# Patient Record
Sex: Female | Born: 1980 | Race: Asian | Hispanic: No | Marital: Single | Smoking: Former smoker
Health system: Southern US, Community
[De-identification: ages and names within clinical notes are randomized; demographics above are authoritative.]

## PROBLEM LIST (undated history)

## (undated) DIAGNOSIS — F909 Attention-deficit hyperactivity disorder, unspecified type: Secondary | ICD-10-CM

## (undated) DIAGNOSIS — T7840XA Allergy, unspecified, initial encounter: Secondary | ICD-10-CM

## (undated) DIAGNOSIS — M797 Fibromyalgia: Secondary | ICD-10-CM

## (undated) HISTORY — DX: Allergy, unspecified, initial encounter: T78.40XA

## (undated) HISTORY — DX: Attention-deficit hyperactivity disorder, unspecified type: F90.9

## (undated) HISTORY — DX: Fibromyalgia: M79.7

## (undated) HISTORY — PX: WISDOM TOOTH EXTRACTION: SHX21

---

## 2006-10-27 ENCOUNTER — Ambulatory Visit: Payer: Self-pay | Admitting: Family Medicine

## 2006-10-27 DIAGNOSIS — F329 Major depressive disorder, single episode, unspecified: Secondary | ICD-10-CM | POA: Insufficient documentation

## 2006-10-27 DIAGNOSIS — F909 Attention-deficit hyperactivity disorder, unspecified type: Secondary | ICD-10-CM | POA: Insufficient documentation

## 2006-10-27 DIAGNOSIS — N390 Urinary tract infection, site not specified: Secondary | ICD-10-CM | POA: Insufficient documentation

## 2006-10-27 DIAGNOSIS — J45909 Unspecified asthma, uncomplicated: Secondary | ICD-10-CM | POA: Insufficient documentation

## 2006-10-28 ENCOUNTER — Encounter: Payer: Self-pay | Admitting: Family Medicine

## 2006-12-14 ENCOUNTER — Ambulatory Visit: Payer: Self-pay | Admitting: Family Medicine

## 2006-12-14 LAB — CONVERTED CEMR LAB
Bilirubin Urine: NEGATIVE
Glucose, Urine, Semiquant: NEGATIVE
Ketones, urine, test strip: NEGATIVE
Nitrite: NEGATIVE
Protein, U semiquant: NEGATIVE
WBC Urine, dipstick: NEGATIVE

## 2007-01-06 ENCOUNTER — Encounter: Admission: RE | Admit: 2007-01-06 | Discharge: 2007-01-06 | Payer: Self-pay | Admitting: *Deleted

## 2007-01-26 ENCOUNTER — Telehealth: Payer: Self-pay | Admitting: Family Medicine

## 2007-04-24 ENCOUNTER — Telehealth: Payer: Self-pay | Admitting: Family Medicine

## 2007-05-04 ENCOUNTER — Telehealth: Payer: Self-pay | Admitting: Family Medicine

## 2007-05-10 ENCOUNTER — Telehealth: Payer: Self-pay | Admitting: Family Medicine

## 2007-05-15 ENCOUNTER — Encounter: Payer: Self-pay | Admitting: Family Medicine

## 2007-06-30 ENCOUNTER — Ambulatory Visit: Payer: Self-pay | Admitting: Family Medicine

## 2007-06-30 DIAGNOSIS — K219 Gastro-esophageal reflux disease without esophagitis: Secondary | ICD-10-CM | POA: Insufficient documentation

## 2007-09-06 ENCOUNTER — Telehealth: Payer: Self-pay | Admitting: Family Medicine

## 2007-11-15 ENCOUNTER — Ambulatory Visit: Payer: Self-pay | Admitting: Family Medicine

## 2007-11-20 ENCOUNTER — Telehealth: Payer: Self-pay | Admitting: Family Medicine

## 2008-01-26 ENCOUNTER — Ambulatory Visit: Payer: Self-pay | Admitting: Family Medicine

## 2008-01-26 DIAGNOSIS — R519 Headache, unspecified: Secondary | ICD-10-CM | POA: Insufficient documentation

## 2008-01-26 DIAGNOSIS — R51 Headache: Secondary | ICD-10-CM | POA: Insufficient documentation

## 2008-04-24 ENCOUNTER — Telehealth: Payer: Self-pay | Admitting: Family Medicine

## 2008-05-02 ENCOUNTER — Telehealth: Payer: Self-pay | Admitting: Family Medicine

## 2008-06-14 ENCOUNTER — Ambulatory Visit: Payer: Self-pay | Admitting: Family Medicine

## 2008-06-14 DIAGNOSIS — M791 Myalgia, unspecified site: Secondary | ICD-10-CM | POA: Insufficient documentation

## 2008-06-18 LAB — CONVERTED CEMR LAB

## 2008-06-26 ENCOUNTER — Telehealth: Payer: Self-pay | Admitting: Family Medicine

## 2008-07-08 ENCOUNTER — Encounter: Payer: Self-pay | Admitting: Family Medicine

## 2008-07-10 ENCOUNTER — Encounter: Payer: Self-pay | Admitting: Family Medicine

## 2008-07-10 ENCOUNTER — Emergency Department (HOSPITAL_COMMUNITY): Admission: EM | Admit: 2008-07-10 | Discharge: 2008-07-11 | Payer: Self-pay | Admitting: Emergency Medicine

## 2008-07-10 ENCOUNTER — Emergency Department (HOSPITAL_COMMUNITY): Admission: EM | Admit: 2008-07-10 | Discharge: 2008-07-10 | Payer: Self-pay | Admitting: Family Medicine

## 2008-07-12 ENCOUNTER — Ambulatory Visit: Payer: Self-pay | Admitting: Family Medicine

## 2008-07-12 LAB — CONVERTED CEMR LAB
Bilirubin Urine: NEGATIVE
Blood in Urine, dipstick: NEGATIVE
Glucose, Urine, Semiquant: NEGATIVE
Ketones, urine, test strip: NEGATIVE
Nitrite: NEGATIVE
Urobilinogen, UA: 0.2
pH: 6.5

## 2008-07-30 ENCOUNTER — Encounter: Payer: Self-pay | Admitting: Family Medicine

## 2008-08-13 ENCOUNTER — Telehealth: Payer: Self-pay | Admitting: Family Medicine

## 2008-10-23 ENCOUNTER — Telehealth: Payer: Self-pay | Admitting: Family Medicine

## 2008-11-18 ENCOUNTER — Telehealth (INDEPENDENT_AMBULATORY_CARE_PROVIDER_SITE_OTHER): Payer: Self-pay | Admitting: *Deleted

## 2008-12-16 ENCOUNTER — Ambulatory Visit: Payer: Self-pay | Admitting: Family Medicine

## 2009-02-27 ENCOUNTER — Ambulatory Visit: Payer: Self-pay | Admitting: Family Medicine

## 2009-03-24 ENCOUNTER — Emergency Department (HOSPITAL_COMMUNITY): Admission: EM | Admit: 2009-03-24 | Discharge: 2009-03-24 | Payer: Self-pay | Admitting: Emergency Medicine

## 2009-03-25 ENCOUNTER — Ambulatory Visit: Payer: Self-pay | Admitting: Family Medicine

## 2009-03-25 DIAGNOSIS — R29898 Other symptoms and signs involving the musculoskeletal system: Secondary | ICD-10-CM | POA: Insufficient documentation

## 2009-03-25 LAB — CONVERTED CEMR LAB
Blood in Urine, dipstick: NEGATIVE
Ketones, urine, test strip: NEGATIVE
Specific Gravity, Urine: <1.005
Urobilinogen, UA: 0.2
pH: 5.5

## 2009-03-26 LAB — CONVERTED CEMR LAB
Albumin: 4.9 g/dL (ref 3.5–5.2)
Basophils Relative: 2.1 % (ref 0.0–3.0)
CO2: 30 meq/L (ref 19–32)
Calcium: 10.4 mg/dL (ref 8.4–10.5)
Creatinine, Ser: 0.7 mg/dL (ref 0.4–1.2)
Eosinophils Absolute: 0 10*3/uL (ref 0.0–0.7)
GFR calc non Af Amer: 107.08 mL/min (ref >60)
Hemoglobin: 14.7 g/dL (ref 12.0–15.0)
Lymphs Abs: 1.6 10*3/uL (ref 0.7–4.0)
MCHC: 32.3 g/dL (ref 30.0–36.0)
Monocytes Absolute: 0.2 10*3/uL (ref 0.1–1.0)
Monocytes Relative: 3.6 % (ref 3.0–12.0)
Potassium: 4.3 meq/L (ref 3.5–5.1)
RDW: 12.6 % (ref 11.5–14.6)
Sodium: 143 meq/L (ref 135–145)
TSH: 0.58 microintl units/mL (ref 0.35–5.50)
Total Bilirubin: 0.9 mg/dL (ref 0.3–1.2)
Total Protein: 8.2 g/dL (ref 6.0–8.3)
WBC: 6.6 10*3/uL (ref 4.5–10.5)
hCG, Beta Chain, Quant, S: 0.42 milliintl units/mL

## 2009-05-12 ENCOUNTER — Emergency Department: Payer: Self-pay | Admitting: Emergency Medicine

## 2009-08-07 ENCOUNTER — Telehealth: Payer: Self-pay | Admitting: Family Medicine

## 2009-10-14 ENCOUNTER — Telehealth: Payer: Self-pay | Admitting: Family Medicine

## 2009-11-26 ENCOUNTER — Telehealth: Payer: Self-pay | Admitting: Family Medicine

## 2009-12-05 ENCOUNTER — Ambulatory Visit: Payer: Self-pay | Admitting: Family Medicine

## 2009-12-26 ENCOUNTER — Telehealth: Payer: Self-pay

## 2010-01-01 ENCOUNTER — Telehealth: Payer: Self-pay | Admitting: Family Medicine

## 2010-01-02 ENCOUNTER — Ambulatory Visit: Payer: Self-pay | Admitting: Internal Medicine

## 2010-01-02 LAB — CONVERTED CEMR LAB: Inflenza A Ag: NEGATIVE

## 2010-01-05 ENCOUNTER — Ambulatory Visit: Payer: Self-pay | Admitting: Family Medicine

## 2010-02-17 NOTE — Progress Notes (Signed)
Summary: Pt req script for Adderall XR 20mg   Phone Note Call from Patient Call back at Home Phone [phone]   Caller: Patient Summary of Call: Pt called and is req to get script for Adderall XR 20mg .   Pls notify pt when ready for pick up.  Initial call taken by: Lucy Antigua,  August 07, 2009 1:31 PM  Follow-up for Phone Call        done Follow-up by: Nelwyn Salisbury MD,  August 08, 2009 10:37 AM    New/Updated Medications: ADDERALL XR 20 MG XR24H-CAP (AMPHETAMINE-DEXTROAMPHETAMINE) two times a day as needed Prescriptions: ADDERALL XR 20 MG XR24H-CAP (AMPHETAMINE-DEXTROAMPHETAMINE) two times a day as needed  #60 x 0   Entered and Authorized by:   Nelwyn Salisbury MD   Signed by:   Nelwyn Salisbury MD on 08/08/2009   Method used:   Print then Give to Patient   RxID:   [id number]

## 2010-02-17 NOTE — Progress Notes (Signed)
Summary: new rx  Phone Note Refill Request Message from:  Patient  Refills Requested: Medication #1:  ADDERALL XR 20 MG XR24H-CAP two times a day. new rx  Initial call taken by: Heron Sabins,  November 26, 2009 1:01 PM  Follow-up for Phone Call        this is no longer available. Try Vyvanse for a month  Follow-up by: Nelwyn Salisbury MD,  November 26, 2009 2:21 PM  Additional Follow-up for Phone Call Additional follow up Details #1::        pt aware Additional Follow-up by: Pura Spice, RN,  November 26, 2009 3:49 PM    New/Updated Medications: VYVANSE 40 MG CAPS (LISDEXAMFETAMINE DIMESYLATE) once daily Prescriptions: VYVANSE 40 MG CAPS (LISDEXAMFETAMINE DIMESYLATE) once daily  #30 x 0   Entered and Authorized by:   Nelwyn Salisbury MD   Signed by:   Nelwyn Salisbury MD on 11/26/2009   Method used:   Print then Give to Patient   RxID:   [id number]

## 2010-02-17 NOTE — Assessment & Plan Note (Signed)
Summary: sinuses///ccm   Vital Signs:  Patient profile:   30 year old female Temp:     97.8 degrees F oral BP sitting:   110 / 78  (left arm) Cuff size:   regular  Vitals Entered By: Sid Falcon LPN (February 27, 2009 1:29 PM) CC: Sinusitis symptoms X 6 days   History of Present Illness: Acute visit. Onset approximately 1 week ago sinus congestive symptoms. Intermittent facial pain and headaches. Some sore throat. No fever. Had left over amoxicillin and started taking last Friday but only taking once or twice daily. Some mild improvement since starting.  Allergies (verified): No Known Drug Allergies  Past History:  Past Medical History: Last updated: 10/27/2006 Depression UTI's ADHD Allergic rhinitis Asthma PMH reviewed for relevance  Review of Systems      See HPI  Physical Exam  General:  Well-developed,well-nourished,in no acute distress; alert,appropriate and cooperative throughout examination Ears:  External ear exam shows no significant lesions or deformities.  Otoscopic examination reveals clear canals, tympanic membranes are intact bilaterally without bulging, retraction, inflammation or discharge. Hearing is grossly normal bilaterally. Nose:  erythematous nasal mucosa otherwise normal Mouth:  Oral mucosa and oropharynx without lesions or exudates.  Teeth in good repair. Neck:  No deformities, masses, or tenderness noted. Lungs:  Normal respiratory effort, chest expands symmetrically. Lungs are clear to auscultation, no crackles or wheezes. Heart:  Normal rate and regular rhythm. S1 and S2 normal without gallop, murmur, click, rub or other extra sounds.   Impression & Recommendations:  Problem # 1:  SINUSITIS, ACUTE (ICD-461.9) ?viral but will go ahead and complete course of antibiotics since she has started and has seen some mprovement. Her updated medication list for this problem includes:    Nasacort Aq 55 Mcg/act Aers (Triamcinolone acetonide(nasal))  .Marland Kitchen... 2 sprays each nostril once daily    Allegra-d 24 Hour 180-240 Mg Xr24h-tab (Fexofenadine-pseudoephedrine) .Marland Kitchen... 1 by mouth once daily    Amoxicillin 500 Mg Caps (Amoxicillin) ..... One by mouth three times a day for 10 days  Complete Medication List: 1)  Nuvaring 0.12-0.015 Mg/24hr Ring (Etonogestrel-ethinyl estradiol) .Marland Kitchen.. 1 by mouth once daily 2)  Nasacort Aq 55 Mcg/act Aers (Triamcinolone acetonide(nasal)) .... 2 sprays each nostril once daily 3)  Adderall Xr 20 Mg Cp24 (Amphetamine-dextroamphetamine) .... Two times a day 4)  Allegra-d 24 Hour 180-240 Mg Xr24h-tab (Fexofenadine-pseudoephedrine) .Marland Kitchen.. 1 by mouth once daily 5)  Flexeril 10 Mg Tabs (Cyclobenzaprine hcl) .... Three times a day as needed spasm 6)  Nabumetone 750 Mg Tabs (Nabumetone) .... Two times a day 7)  Nexium 40 Mg Cpdr (Esomeprazole magnesium) .... Once daily 8)  Pristiq 100 Mg Xr24h-tab (Desvenlafaxine succinate) .... Once daily 9)  Lorazepam 1 Mg Tabs (Lorazepam) .... Three times a day as needed anxiety 10)  Amoxicillin 500 Mg Caps (Amoxicillin) .... One by mouth three times a day for 10 days  Patient Instructions: 1)  Acute sinusitis symptoms for less than 10 days are not helped by antibiotics. Use warm moist compresses, and over the counter decongestants( only as directed). Call if no improvement in 5-7 days, sooner if increasing pain, fever, or new symptoms.  Prescriptions: AMOXICILLIN 500 MG CAPS (AMOXICILLIN) one by mouth three times a day for 10 days  #30 x 0   Entered and Authorized by:   Evelena Peat MD   Signed by:   Evelena Peat MD on 02/27/2009   Method used:   Electronically to        [id number] ([facility]       [address]       [place], [place]  [address]       Ph: [phone] or [phone]       Fax: [phone]   RxID:   [id number]

## 2010-02-17 NOTE — Assessment & Plan Note (Signed)
Summary: WEAKNESS/NJR   Vital Signs:  Patient profile:   30 year old female Height:      63 inches Weight:      124.8 pounds BMI:     22.19 Temp:     98.6 degrees F oral BP sitting:   136 / 90  (left arm) Cuff size:   regular  Vitals Entered By: Alfred Levins, CMA (March 25, 2009 2:19 PM) CC: emotional issues   History of Present Illness: Here with her father for a complicated set of issues. Apaarently she met a young man just one week ago with whom she became infatuated and to whom she became engaged. He was posing as a pediatric doctor new to the area who has supposedly been hired by [employer]. He gave her a business card listing his name and the name of a partner. Of note, this card lists an address which does not exist in [place], and it gives an email address for [facility] which does not exist. Obviously this man is a fake and a swindler, and he was trying to get her to give him some of her father's financial information like bank account numbers, etc. Her father has been talking to the [facility] [employer] about this man, and he told Judeth Cornfield all this earlier today. Of course she is distraught, and she has been extremely emotional ever since. She has been crying non-stop, and she complains of feeling very weak all over. She is demanding that I check labs on her today and is convinced something is wrong with her. She saw Dr. Billy Coast yesterday for a Pap smear and it sounds like he did some STD testing. She had also been using a [place] IUD, but she told Dr. Billy Coast to remove it and he did. She has no other specific symptoms today.   Current Medications (verified): 1)  Nuvaring 0.12-0.015 Mg/24hr  Ring (Etonogestrel-Ethinyl Estradiol) .Marland Kitchen.. 1 By Mouth Once Daily 2)  Allegra-D 24 Hour 180-240 Mg Xr24h-Tab (Fexofenadine-Pseudoephedrine) .Marland Kitchen.. 1 By Mouth Once Daily 3)  Flexeril 10 Mg  Tabs (Cyclobenzaprine Hcl) .... Three Times A Day As Needed Spasm 4)  Nabumetone 750 Mg  Tabs  (Nabumetone) .... Two Times A Day 5)  Pristiq 100 Mg Xr24h-Tab (Desvenlafaxine Succinate) .... Once Daily 6)  Lorazepam 1 Mg Tabs (Lorazepam) .... Three Times A Day As Needed Anxiety  Allergies (verified): No Known Drug Allergies  Past History:  Past Medical History: Depression, sees Dr. Milagros Evener for meds and Dr. Greig Castilla Proffer for therapy UTI's ADHD Allergic rhinitis Asthma sees Dr. Billy Coast for GYN exams  Past Surgical History: Reviewed history from 10/27/2006 and no changes required. wisdom teeth  Review of Systems  The patient denies anorexia, fever, weight loss, weight gain, vision loss, decreased hearing, hoarseness, chest pain, syncope, dyspnea on exertion, peripheral edema, prolonged cough, headaches, hemoptysis, abdominal pain, melena, hematochezia, severe indigestion/heartburn, hematuria, incontinence, genital sores, muscle weakness, suspicious skin lesions, transient blindness, difficulty walking, depression, unusual weight change, abnormal bleeding, enlarged lymph nodes, angioedema, breast masses, and testicular masses.    Physical Exam  General:  Well-developed,well-nourished,in no acute distress; alert,appropriate and cooperative throughout examination Neck:  No deformities, masses, or tenderness noted. Lungs:  Normal respiratory effort, chest expands symmetrically. Lungs are clear to auscultation, no crackles or wheezes. Heart:  Normal rate and regular rhythm. S1 and S2 normal without gallop, murmur, click, rub or other extra sounds. Abdomen:  Bowel sounds positive,abdomen soft and non-tender without masses, organomegaly or hernias noted.  Neurologic:  alert & oriented X3 and gait normal.   Psych:  Oriented X3, memory intact for recent and remote, and good eye contact.  Extremely anxious and labile, alternates between sobbing uncontrollably and lying on  the exam table motionless.    Impression & Recommendations:  Problem # 1:  WEAKNESS  (ICD-780.79)  Orders: UA Dipstick w/o Micro (automated)  (81003) Venipuncture (82956) TLB-BMP (Basic Metabolic Panel-BMET) (80048-METABOL) TLB-CBC Platelet - w/Differential (85025-CBCD) TLB-Hepatic/Liver Function Pnl (80076-HEPATIC) TLB-TSH (Thyroid Stimulating Hormone) (84443-TSH) TLB-B12, Serum-Total ONLY (21308-M57) TLB-Preg Serum Quant (B-hCG) (84702-HCG-QN)  Problem # 2:  DEPRESSION (ICD-311)  The following medications were removed from the medication list:    Pristiq 100 Mg Xr24h-tab (Desvenlafaxine succinate) ..... Once daily    Lorazepam 1 Mg Tabs (Lorazepam) .Marland Kitchen... Three times a day as needed anxiety Her updated medication list for this problem includes:    Lexapro 10 Mg Tabs (Escitalopram oxalate)  Complete Medication List: 1)  Lexapro 10 Mg Tabs (Escitalopram oxalate)  Patient Instructions: 1)  At her request we will draw labs today. Clearly she is very upset at the prospect that this man has manipulated her. I strongly urged her to contact both Dr. Inez Pilgrim and Dr. Evelene Croon about this ASAP.   Laboratory Results   Urine Tests  Date/Time Recieved: March 25, 2009 4:58 PM  Date/Time Reported: March 25, 2009 4:58 PM   Routine Urinalysis   Color: yellow Appearance: Clear Glucose: negative   (Normal Range: Negative) Bilirubin: negative   (Normal Range: Negative) Ketone: negative   (Normal Range: Negative) Spec. Gravity: <1.005   (Normal Range: 1.003-1.035) Blood: negative   (Normal Range: Negative) pH: 5.5   (Normal Range: 5.0-8.0) Protein: negative   (Normal Range: Negative) Urobilinogen: 0.2   (Normal Range: 0-1) Nitrite: negative   (Normal Range: Negative) Leukocyte Esterace: negative   (Normal Range: Negative)    Comments: Wynona Canes, CMA  March 25, 2009 4:58 PM

## 2010-02-17 NOTE — Progress Notes (Signed)
Summary: Medication question  Phone Note Call from Patient   Reason for Call: Talk to Nurse Summary of Call: Has question about her current medication - Adderol.  Please call patient as soon as you can.  [phone]. Initial call taken by: Everrett Coombe,  December 26, 2009 3:19 PM  Follow-up for Phone Call        [facility] Follow-up by: [facility] CMA AAMA,  December 26, 2009 4:54 PM  Additional Follow-up for Phone Call Additional follow up Details #1::        Pt. is asking about when Adderal when become available again.  Advised to check with the pharmacy. Additional Follow-up by: Lynann Beaver CMA AAMA,  December 26, 2009 5:05 PM

## 2010-02-17 NOTE — Assessment & Plan Note (Signed)
Summary: FLU SHOT // RS  Nurse Visit   Review of Systems       Flu Vaccine Consent Questions     Do you have a history of severe allergic reactions to this vaccine? no    Any prior history of allergic reactions to egg and/or gelatin? no    Do you have a sensitivity to the preservative Thimersol? no    Do you have a past history of Guillan-Barre Syndrome? no    Do you currently have an acute febrile illness? no    Have you ever had a severe reaction to latex? no    Vaccine information given and explained to patient? yes    Are you currently pregnant? no    Lot Number:[id number]   Exp Date:07/17/2009   Site Given  Left Deltoid IM Pura Spice, RN  December 05, 2009 3:16 PM    Allergies: No Known Drug Allergies  Orders Added: 1)  Admin 1st Vaccine [90471] 2)  Flu Vaccine 35yrs + [72536]

## 2010-02-17 NOTE — Progress Notes (Signed)
Summary: REFILL REQUEST  Phone Note Refill Request Message from:  Patient  [phone] on October 14, 2009 10:47 AM  Refills Requested: Medication #1:  ADDERALL XR 20 MG XR24H-CAP two times a day as needed.   Notes: Pt can be reached at [phone] when Rx is ready for p/u.    Initial call taken by: Debbra Riding,  October 14, 2009 10:47 AM  Follow-up for Phone Call        done Follow-up by: Nelwyn Salisbury MD,  October 14, 2009 4:33 PM    New/Updated Medications: ADDERALL XR 20 MG XR24H-CAP (AMPHETAMINE-DEXTROAMPHETAMINE) two times a day Prescriptions: ADDERALL XR 20 MG XR24H-CAP (AMPHETAMINE-DEXTROAMPHETAMINE) two times a day  #60 x 0   Entered by:   Nelwyn Salisbury MD   Authorized by:   Pura Spice, RN   Signed by:   Nelwyn Salisbury MD on 10/14/2009   Method used:   Print then Give to Patient   RxID:   [id number]  Left message pt pick up prescription.

## 2010-02-19 NOTE — Assessment & Plan Note (Signed)
Summary: med check/consult re meds   Vital Signs:  Patient profile:   30 year old female Weight:      115 pounds O2 Sat:      98 % Temp:     98.7 degrees F BP sitting:   110 / 76  (left arm)  Vitals Entered By: Pura Spice, RN (January 05, 2010 4:18 PM) CC: multiple  issues  ck left 5 th toe ? corn  talk about adderall  wants rx for latisse. and wants referral for rectal tear    History of Present Illness: here to discuss ADHD, corns, and rectal tears. She was here last week for a viral URI, and this is almost gone. She had tried Vyvanse during a recent Adderall shortage, and she has decided to go back to Adderall. She has had several tiny sore spots on her toes for anbout a year. Also she has frequentr anal tears which open during a BM and bleed slightly.   Allergies: No Known Drug Allergies  Past History:  Past Medical History: Reviewed history from 03/25/2009 and no changes required. Depression, sees Dr. Milagros Evener for meds and Dr. Greig Castilla Proffer for therapy UTI's ADHD Allergic rhinitis Asthma sees Dr. Billy Coast for GYN exams  Past Surgical History: Reviewed history from 10/27/2006 and no changes required. wisdom teeth  Review of Systems  The patient denies anorexia, fever, weight loss, weight gain, vision loss, decreased hearing, hoarseness, chest pain, syncope, dyspnea on exertion, peripheral edema, prolonged cough, headaches, hemoptysis, abdominal pain, melena, hematochezia, severe indigestion/heartburn, hematuria, incontinence, genital sores, muscle weakness, suspicious skin lesions, transient blindness, difficulty walking, depression, unusual weight change, abnormal bleeding, enlarged lymph nodes, angioedema, breast masses, and testicular masses.    Physical Exam  General:  Well-developed,well-nourished,in no acute distress; alert,appropriate and cooperative throughout examination Extremities:  the 5th toes of each foot have small lumps over the DIPs  Psych:   Cognition and judgment appear intact. Alert and cooperative with normal attention span and concentration. 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 Two times a day, may fill on 03-05-10 2)  Anusol-hc 25 Mg Supp (Hydrocortisone acetate) .... Use q 4 hours as needed 3)  Latisse 0.03 % Soln (Bimatoprost) .... Apply once daily  Patient Instructions: 1)  try to get more dietary fiber. Try Anusol HC suppositories. Rub the corns with a pumice stone once daily in the shower. Prescriptions: LATISSE 0.03 % SOLN (BIMATOPROST) apply once daily  #60 x 11   Entered and Authorized by:   Nelwyn Salisbury MD   Signed by:   Nelwyn Salisbury MD on 01/05/2010   Method used:   [facility] to        [facility]  [phone][facility] (retail)       [address] [place], [place]  [address]       Ph: [phone] or [phone]       Fax: [phone]   RxID:   [id number] ANUSOL-HC 25 MG SUPP (HYDROCORTISONE ACETATE) use q 4 hours as needed  #60 x 5   Entered and Authorized by:   Nelwyn Salisbury MD   Signed by:   Nelwyn Salisbury MD on 01/05/2010   Method used:   [facility] to  [facility]  [phone]* (retail)       [address] [place], [place]  [address]       Ph: [phone] or [phone]       Fax: [phone]   RxID:   [id number]    Orders Added: 1)  Est. Patient Level IV [44034]

## 2010-02-19 NOTE — Progress Notes (Signed)
Summary: new rx  Phone Note Refill Request Call back at Home Phone [phone] Message from:  Patient  pt needs new rx adderall xr 20 mg twice a day  Initial call taken by: Heron Sabins,  January 01, 2010 2:11 PM  Follow-up for Phone Call        done Follow-up by: Nelwyn Salisbury MD,  January 02, 2010 2:12 PM  Additional Follow-up for Phone Call Additional follow up Details #1::        pt cam in to be seen and p/u rx's Additional Follow-up by: Alfred Levins, CMA,  January 02, 2010 3:35 PM    New/Updated Medications: ADDERALL XR 20 MG XR24H-CAP (AMPHETAMINE-DEXTROAMPHETAMINE) two times a day ADDERALL XR 20 MG XR24H-CAP (AMPHETAMINE-DEXTROAMPHETAMINE) two times a day, may fill on 02-02-10 ADDERALL XR 20 MG XR24H-CAP (AMPHETAMINE-DEXTROAMPHETAMINE) two times a day, may fill on 03-05-10 Prescriptions: ADDERALL XR 20 MG XR24H-CAP (AMPHETAMINE-DEXTROAMPHETAMINE) two times a day, may fill on 03-05-10  #60 x 0   Entered and Authorized by:   Nelwyn Salisbury MD   Signed by:   Nelwyn Salisbury MD on 01/02/2010   Method used:   Print then Give to Patient   RxID:   [id number] ADDERALL XR 20 MG XR24H-CAP (AMPHETAMINE-DEXTROAMPHETAMINE) two times a day, may fill on 02-02-10  #60 x 0   Entered and Authorized by:   Nelwyn Salisbury MD   Signed by:   Nelwyn Salisbury MD on 01/02/2010   Method used:   Print then Give to Patient   RxID:   [id number] ADDERALL XR 20 MG XR24H-CAP (AMPHETAMINE-DEXTROAMPHETAMINE) two times a day  #60 x 0   Entered and Authorized by:   Nelwyn Salisbury MD   Signed by:   Nelwyn Salisbury MD on 01/02/2010   Method used:   Print then Give to Patient   RxID:   [id number]

## 2010-02-19 NOTE — Assessment & Plan Note (Signed)
Summary: FLU LIKE SYMPTOMS//ALP   Vital Signs:  Patient profile:   30 year old female Weight:      121 pounds BMI:     21.51 Temp:     98.8 degrees F oral BP sitting:   114 / 74  (left arm) Cuff size:   regular  Vitals Entered By: Alfred Levins, CMA (January 02, 2010 3:04 PM) CC: weak, achy, sweats x3 days   CC:  weak, achy, and sweats x3 days.  History of Present Illness: Patient presents to clinic as a workin for evaluation of possible URI symptoms. Notes 3d h/o generalized malaise, myalgias, nasal congestion and clear rhinorrhea. Has had several episodes of self-limited epistaxis and subjective fever. Notes +sick exposure. No allieviating or exacerbating factors. Obtained influenza vaccine 11/11.    Allergies: No Known Drug Allergies  Review of Systems General:  Complains of fatigue, fever, and malaise; denies chills. ENT:  Complains of nasal congestion, nosebleeds, and postnasal drainage; denies ear discharge and earache. GI:  Complains of nausea; denies vomiting.  Physical Exam  General:  Well-developed,well-nourished,in no acute distress; alert,appropriate and cooperative throughout examination Head:  Normocephalic and atraumatic without obvious abnormalities. No apparent alopecia or balding. Eyes:  pupils equal, pupils round, corneas and lenses clear, and no injection.   Ears:  External ear exam shows no significant lesions or deformities.  Otoscopic examination reveals clear canals, tympanic membranes are intact bilaterally without bulging, retraction, inflammation or discharge. Hearing is grossly normal bilaterally. Nose:  External nasal examination shows no deformity or inflammation. Nasal mucosa are pink and moist without lesions or exudates. Mouth:  pharynx pink and moist, no erythema, and no exudates.   Neck:  No deformities, masses, or tenderness noted. Lungs:  Normal respiratory effort, chest expands symmetrically. Lungs are clear to auscultation, no crackles or  wheezes. Heart:  Normal rate and regular rhythm. S1 and S2 normal without gallop, murmur, click, rub or other extra sounds. Skin:  turgor normal, color normal, and no rashes.     Impression & Recommendations:  Problem # 1:  URI (ICD-465.9) Flu swab obtained and negative.  Likely viral etiology. Recommend otc symptomatic relief with antihistamine/decongestants.  For mild intermittent epistaxis use otc nasal saline spray. Followup if no improvement or worsening.  Complete Medication List: 1)  Lexapro 10 Mg Tabs (Escitalopram oxalate) 2)  Vyvanse 40 Mg Caps (Lisdexamfetamine dimesylate) .... Once daily 3)  Adderall Xr 20 Mg Xr24h-cap (Amphetamine-dextroamphetamine) .... Two times a day, may fill on 03-05-10  Other Orders: Flu A+B [id number]   Orders Added: 1)  Flu A+B [87400] 2)  Est. Patient Level III [84696]    Laboratory Results    Other Tests  Influenza A: negative Influenza B: negative Comments: Rita Ohara  January 02, 2010 4:00 PM   Kit Test Internal QC: Negative   (Normal Range: Negative)

## 2010-02-23 ENCOUNTER — Telehealth: Payer: Self-pay | Admitting: Family Medicine

## 2010-02-23 DIAGNOSIS — F909 Attention-deficit hyperactivity disorder, unspecified type: Secondary | ICD-10-CM

## 2010-02-23 NOTE — Telephone Encounter (Addendum)
Pt lost adderall 20 mg #60 prescription. This is message already sent

## 2010-02-27 MED ORDER — AMPHETAMINE-DEXTROAMPHET ER 20 MG PO CP24: 20.0000 mg | ORAL_CAPSULE | Freq: Two times a day (BID) | ORAL | Status: DC

## 2010-02-27 NOTE — Telephone Encounter (Signed)
Done, but I wrote for one month only. She needs to take better care of her prescriptions.

## 2010-02-27 NOTE — Telephone Encounter (Signed)
Left mess with pt rx aware for pick up

## 2010-03-19 ENCOUNTER — Telehealth: Payer: Self-pay | Admitting: Family Medicine

## 2010-03-19 DIAGNOSIS — F909 Attention-deficit hyperactivity disorder, unspecified type: Secondary | ICD-10-CM

## 2010-03-19 NOTE — Telephone Encounter (Signed)
Pt called to adv that she needs a refill on med: Adderall xr 20mg ..... Pt can be reached at [phone].

## 2010-03-20 MED ORDER — AMPHETAMINE-DEXTROAMPHET ER 20 MG PO CP24: 20.0000 mg | ORAL_CAPSULE | Freq: Two times a day (BID) | ORAL | Status: DC

## 2010-03-20 NOTE — Telephone Encounter (Signed)
done

## 2010-03-20 NOTE — Telephone Encounter (Signed)
rx up front ready for p/u, pt aware 

## 2010-04-01 ENCOUNTER — Telehealth: Payer: Self-pay | Admitting: Family Medicine

## 2010-04-01 NOTE — Telephone Encounter (Signed)
Pt came by and would like to req Adderall XR 20 mg # 60 for 3 month.  Pls call pt when ready for pick up. No rush.

## 2010-04-07 NOTE — Telephone Encounter (Signed)
Pt already came by and got rx for adderall

## 2010-04-27 LAB — COMPREHENSIVE METABOLIC PANEL
ALT: 12 U/L (ref 0–35)
Albumin: 4 g/dL (ref 3.5–5.2)
Calcium: 9.3 mg/dL (ref 8.4–10.5)
Creatinine, Ser: 0.85 mg/dL (ref 0.4–1.2)
GFR calc non Af Amer: >60 mL/min (ref >60)
Potassium: 3.2 mEq/L — ABNORMAL LOW (ref 3.5–5.1)
Total Bilirubin: 0.8 mg/dL (ref 0.3–1.2)

## 2010-04-27 LAB — DIFFERENTIAL
Eosinophils Absolute: 0.3 10*3/uL (ref 0.0–0.7)
Lymphs Abs: 0.6 10*3/uL — ABNORMAL LOW (ref 0.7–4.0)
Monocytes Absolute: 0.3 10*3/uL (ref 0.1–1.0)
Neutro Abs: 6.8 10*3/uL (ref 1.7–7.7)
Neutrophils Relative %: 85 % — ABNORMAL HIGH (ref 43–77)

## 2010-04-27 LAB — LIPASE, BLOOD: Lipase: 14 U/L (ref 11–59)

## 2010-04-27 LAB — CBC
HCT: 43.7 % (ref 36.0–46.0)
MCV: 94.5 fL (ref 78.0–100.0)
RDW: 12.8 % (ref 11.5–15.5)

## 2010-04-27 LAB — URINALYSIS, ROUTINE W REFLEX MICROSCOPIC
Bilirubin Urine: NEGATIVE
Glucose, UA: NEGATIVE mg/dL
Ketones, ur: >80 mg/dL — AB
Protein, ur: NEGATIVE mg/dL
Specific Gravity, Urine: 1.006 (ref 1.005–1.030)
pH: 7 (ref 5.0–8.0)

## 2010-04-27 LAB — URINE MICROSCOPIC-ADD ON

## 2010-04-29 ENCOUNTER — Other Ambulatory Visit: Payer: Self-pay

## 2010-04-29 NOTE — Telephone Encounter (Signed)
Pt would like to pick up adderall 20mg  XR (generic) on Monday Call back [phone]

## 2010-05-04 MED ORDER — AMPHETAMINE-DEXTROAMPHET ER 20 MG PO CP24: 20.0000 mg | ORAL_CAPSULE | Freq: Two times a day (BID) | ORAL | Status: DC

## 2010-05-04 NOTE — Telephone Encounter (Signed)
Pt aware rx ready.,

## 2010-05-04 NOTE — Telephone Encounter (Signed)
done

## 2010-05-26 ENCOUNTER — Ambulatory Visit (INDEPENDENT_AMBULATORY_CARE_PROVIDER_SITE_OTHER): Payer: BC Managed Care – PPO | Admitting: Family Medicine

## 2010-05-26 ENCOUNTER — Encounter: Payer: Self-pay | Admitting: Family Medicine

## 2010-05-26 VITALS — BP 98/66 | Temp 98.6°F | Wt 115.0 lb

## 2010-05-26 DIAGNOSIS — R300 Dysuria: Secondary | ICD-10-CM

## 2010-05-26 DIAGNOSIS — M791 Myalgia, unspecified site: Secondary | ICD-10-CM

## 2010-05-26 DIAGNOSIS — K219 Gastro-esophageal reflux disease without esophagitis: Secondary | ICD-10-CM

## 2010-05-26 DIAGNOSIS — IMO0001 Reserved for inherently not codable concepts without codable children: Secondary | ICD-10-CM

## 2010-05-26 DIAGNOSIS — J329 Chronic sinusitis, unspecified: Secondary | ICD-10-CM

## 2010-05-26 LAB — POCT URINALYSIS DIPSTICK
Blood, UA: NEGATIVE
Glucose, UA: NEGATIVE
Ketones, UA: NEGATIVE
Protein, UA: NEGATIVE
Spec Grav, UA: 1.005
Urobilinogen, UA: 0.2

## 2010-05-26 MED ORDER — ESOMEPRAZOLE MAGNESIUM 40 MG PO CPDR: 40.0000 mg | DELAYED_RELEASE_CAPSULE | Freq: Every day | ORAL | Status: DC

## 2010-05-26 MED ORDER — AZITHROMYCIN 250 MG PO TABS: ORAL_TABLET | ORAL | Status: AC

## 2010-05-26 NOTE — Progress Notes (Signed)
  Subjective:    Patient ID: Joyce Cortez, female    DOB: Aug 24, 1980, 30 y.o.   MRN: [id number]  HPI Here for one week of sinus pressure, PND, ST, and a dry cough. No fever.   Review of Systems  Constitutional: Negative.   HENT: Positive for congestion, postnasal drip and sinus pressure.   Eyes: Negative.   Respiratory: Positive for cough.        Objective:   Physical Exam  Constitutional: She appears well-developed and well-nourished.  HENT:  Right Ear: External ear normal.  Left Ear: External ear normal.  Nose: Nose normal.  Mouth/Throat: Oropharynx is clear and moist. No oropharyngeal exudate.  Eyes: Conjunctivae are normal. Pupils are equal, round, and reactive to light.  Pulmonary/Chest: Effort normal and breath sounds normal.  Lymphadenopathy:    She has no cervical adenopathy.          Assessment & Plan:  Rest. Fluids.

## 2010-05-27 NOTE — Progress Notes (Signed)
Pt informed

## 2010-05-27 NOTE — Progress Notes (Signed)
Addended by: Dwaine Deter on: 05/27/2010 08:16 AM   Modules accepted: Orders

## 2010-08-06 ENCOUNTER — Telehealth: Payer: Self-pay | Admitting: Family Medicine

## 2010-08-06 NOTE — Telephone Encounter (Signed)
Pt. Requesting refill on amphetamine-dextroamphetamine (ADDERALL XR, 20MG ,) 20 MG 24 hr capsule

## 2010-08-07 NOTE — Telephone Encounter (Signed)
Refill request for Adderall and pt last here on 05/26/10 and script last filled on 01/02/10.

## 2010-08-10 MED ORDER — AMPHETAMINE-DEXTROAMPHET ER 20 MG PO CP24: 20.0000 mg | ORAL_CAPSULE | Freq: Two times a day (BID) | ORAL | Status: DC

## 2010-08-10 NOTE — Telephone Encounter (Signed)
done

## 2010-10-05 ENCOUNTER — Telehealth: Payer: Self-pay | Admitting: Family Medicine

## 2010-10-05 NOTE — Telephone Encounter (Signed)
Refill Adderall xr 20mg  bid. Please leave message on phone when ready.

## 2010-10-07 NOTE — Telephone Encounter (Signed)
Pt last seen 05/26/10. Pls advise.

## 2010-10-08 MED ORDER — AMPHETAMINE-DEXTROAMPHET ER 20 MG PO CP24: 20.0000 mg | ORAL_CAPSULE | Freq: Two times a day (BID) | ORAL | Status: DC

## 2010-10-08 NOTE — Telephone Encounter (Signed)
done

## 2010-10-08 NOTE — Telephone Encounter (Signed)
Script is ready for pick up and I tried to call pt but no option to leave a message.

## 2010-10-29 ENCOUNTER — Telehealth: Payer: Self-pay | Admitting: Family Medicine

## 2010-10-29 NOTE — Telephone Encounter (Signed)
Refill Adderall x 3 rxs. Thanks. °

## 2010-10-30 NOTE — Telephone Encounter (Signed)
Left voice message.

## 2010-10-30 NOTE — Telephone Encounter (Signed)
NO, she has refills until 01-07-11 already

## 2011-01-06 ENCOUNTER — Telehealth: Payer: Self-pay | Admitting: Family Medicine

## 2011-01-06 MED ORDER — AMPHETAMINE-DEXTROAMPHET ER 20 MG PO CP24: 20.0000 mg | ORAL_CAPSULE | Freq: Two times a day (BID) | ORAL | Status: DC

## 2011-01-06 NOTE — Telephone Encounter (Signed)
done

## 2011-01-06 NOTE — Telephone Encounter (Signed)
Pt rx is for amphetamine-dextroamphetamine (ADDERALL XR, 20MG ,) 20 MG 24 hr capsule But pt is requesting a refill with script written for Adderall not generic

## 2011-03-31 ENCOUNTER — Other Ambulatory Visit: Payer: Self-pay | Admitting: Family Medicine

## 2011-03-31 NOTE — Telephone Encounter (Signed)
Pt needs new rx adderall 20 mg twice a day #60. Pt would like brand name

## 2011-04-02 MED ORDER — AMPHETAMINE-DEXTROAMPHET ER 20 MG PO CP24: 20.0000 mg | ORAL_CAPSULE | Freq: Two times a day (BID) | ORAL | Status: DC

## 2011-04-02 NOTE — Telephone Encounter (Signed)
done

## 2011-04-02 NOTE — Telephone Encounter (Signed)
Script ready for pick up and spoke with pt. 

## 2011-07-23 ENCOUNTER — Other Ambulatory Visit: Payer: Self-pay | Admitting: Family Medicine

## 2011-07-23 NOTE — Telephone Encounter (Signed)
Pt needs new rx name brand adderall 20 xr #60. Pt is request 3 month rxs

## 2011-07-28 NOTE — Telephone Encounter (Signed)
She needs an OV for this (last seen May 2012)

## 2011-07-28 NOTE — Telephone Encounter (Signed)
Pt will callback to sch ov °

## 2011-08-11 ENCOUNTER — Ambulatory Visit (INDEPENDENT_AMBULATORY_CARE_PROVIDER_SITE_OTHER): Payer: BC Managed Care – PPO | Admitting: Family Medicine

## 2011-08-11 ENCOUNTER — Encounter: Payer: Self-pay | Admitting: Family Medicine

## 2011-08-11 VITALS — BP 108/66 | HR 118 | Temp 98.9°F | Wt 127.0 lb

## 2011-08-11 DIAGNOSIS — F909 Attention-deficit hyperactivity disorder, unspecified type: Secondary | ICD-10-CM

## 2011-08-11 DIAGNOSIS — K219 Gastro-esophageal reflux disease without esophagitis: Secondary | ICD-10-CM

## 2011-08-11 MED ORDER — AMPHETAMINE-DEXTROAMPHET ER 20 MG PO CP24: 20.0000 mg | ORAL_CAPSULE | Freq: Two times a day (BID) | ORAL | Status: DC

## 2011-08-11 NOTE — Progress Notes (Signed)
  Subjective:    Patient ID: Joyce Cortez, female    DOB: 1980/02/12, 31 y.o.   MRN: [id number]  HPI Here to follow up on ADHD and GERD. Over the past year her stress levels have gone down, and she has very little trouble with GERD anymore. She does not take Nexium andis doing well. She remains pleased with Adderall and would like to stay on it. No side effects.    Review of Systems  Constitutional: Negative.   Respiratory: Negative.   Cardiovascular: Negative.   Gastrointestinal: Negative.   Neurological: Negative.   Psychiatric/Behavioral: Negative.        Objective:   Physical Exam  Constitutional: She is oriented to person, place, and time. She appears well-developed and well-nourished.  Cardiovascular: Normal rate, regular rhythm, normal heart sounds and intact distal pulses.   Pulmonary/Chest: Effort normal and breath sounds normal.  Neurological: She is alert and oriented to person, place, and time.          Assessment & Plan:  Refilled Adderall XR for 3  Months

## 2011-09-07 ENCOUNTER — Telehealth: Payer: Self-pay | Admitting: Family Medicine

## 2011-09-07 DIAGNOSIS — K6289 Other specified diseases of anus and rectum: Secondary | ICD-10-CM

## 2011-09-07 NOTE — Telephone Encounter (Signed)
Patient called stating that she would like to be referred to a Gastro. MD preferably one that can see her next week. Please advise.

## 2011-09-08 NOTE — Telephone Encounter (Signed)
done

## 2011-09-08 NOTE — Telephone Encounter (Signed)
Can you call pt and ask why she needs the referral? See below note from the doctor.

## 2011-09-08 NOTE — Telephone Encounter (Signed)
Patient states she has been having problems with fishers and she would like to have an evaluation to make sure that there is nothing more. Patient states her Dermatologist suggested that she see a Gastro. MD for them.

## 2011-09-08 NOTE — Telephone Encounter (Signed)
I need to know why she wants to be referred. I just saw her a few weeks ago and she told she she was doing quite well with her GERD

## 2011-09-08 NOTE — Telephone Encounter (Signed)
I left voice message with below information. 

## 2011-10-13 ENCOUNTER — Encounter: Payer: Self-pay | Admitting: Family Medicine

## 2011-10-13 ENCOUNTER — Ambulatory Visit (INDEPENDENT_AMBULATORY_CARE_PROVIDER_SITE_OTHER): Payer: BC Managed Care – PPO | Admitting: Family Medicine

## 2011-10-13 ENCOUNTER — Telehealth: Payer: Self-pay | Admitting: Gastroenterology

## 2011-10-13 VITALS — BP 102/72 | Temp 99.1°F | Wt 126.0 lb

## 2011-10-13 DIAGNOSIS — J019 Acute sinusitis, unspecified: Secondary | ICD-10-CM

## 2011-10-13 MED ORDER — AZITHROMYCIN 250 MG PO TABS: ORAL_TABLET | ORAL | Status: DC

## 2011-10-13 NOTE — Telephone Encounter (Signed)
Pt scheduled to see Willette Cluster NP 10/15/11 @1 :30pm. Pt being seen for rectal pain. Pt aware of appt date and time.

## 2011-10-13 NOTE — Telephone Encounter (Signed)
Pt has been having the rectal pain for a while now off and on. Pt thinks she may have fissures.

## 2011-10-13 NOTE — Patient Instructions (Addendum)

## 2011-10-13 NOTE — Progress Notes (Signed)
  Subjective:    Patient ID: Joyce Cortez, female    DOB: 03-24-1980, 31 y.o.   MRN: [id number]  HPI  2 week history progressive sinus symptoms. She's had progressive bilateral maxillary facial pain and cough. Slight dyspnea with activity. No wheezing. Initially started with allergy or cold like symptoms. Denies any headaches. Generalized malaise. Cough mostly nonproductive. Possible low-grade fever past couple days. Temperature not taken. Chills off and on. Has had some intermittent colored nasal discharge   Review of Systems As per history of present illness    Objective:   Physical Exam  Constitutional: She appears well-developed and well-nourished.  HENT:  Right Ear: External ear normal.  Left Ear: External ear normal.  Mouth/Throat: Oropharynx is clear and moist.  Neck: Neck supple.  Cardiovascular: Normal rate and regular rhythm.   Pulmonary/Chest: Effort normal and breath sounds normal. No respiratory distress. She has no wheezes. She has no rales.  Lymphadenopathy:    She has no cervical adenopathy.  Skin: No rash noted.          Assessment & Plan:  URI. Symptoms over 2 weeks with progressive facial pain and possible low-grade fevers. Consider acute sinusitis following viral URI. Zithromax for 5 days. Continue good hydration. Touch base next week if not improving

## 2011-10-15 ENCOUNTER — Encounter: Payer: Self-pay | Admitting: Nurse Practitioner

## 2011-10-15 ENCOUNTER — Ambulatory Visit (INDEPENDENT_AMBULATORY_CARE_PROVIDER_SITE_OTHER): Payer: BC Managed Care – PPO | Admitting: Nurse Practitioner

## 2011-10-15 VITALS — BP 118/68 | HR 62 | Ht 64.0 in | Wt 126.0 lb

## 2011-10-15 DIAGNOSIS — K602 Anal fissure, unspecified: Secondary | ICD-10-CM

## 2011-10-15 DIAGNOSIS — K5900 Constipation, unspecified: Secondary | ICD-10-CM

## 2011-10-15 MED ORDER — DILTIAZEM GEL 2 %: CUTANEOUS | Status: DC

## 2011-10-15 MED ORDER — LIDOCAINE HCL 2 % EX GEL: Freq: Three times a day (TID) | CUTANEOUS | Status: DC

## 2011-10-15 NOTE — Patient Instructions (Addendum)
1. Take 2 stool softeners daily 2. Begin daily fiber, samples given to you. Metamucil samples and coupon. 3. If stools are still hard or you continue to have difficulty passing stools then began MiraLax one capful daily in 8 ounces of water in addition to the stool softeners and daily fiber. We have given you samples of Miralax.  4. Sitz baths three times a day for two weeks. We have called in prescriptions to [facility] for you : Diltiazem gel and Zylocaine Jelly. Use as directed. We have given them your BC[employer] RX numbers .   [facility] is in the [employer] directly across the parking lot from [employer].   Their phone number is [phone].

## 2011-10-15 NOTE — Progress Notes (Signed)
Agree with Ms. Guenther's assessment and plan. Dewaun Kinzler E. Laddie Naeem, MD, FACG   

## 2011-10-15 NOTE — Progress Notes (Signed)
10/15/2011 Joyce Cortez [id number] Apr 22, 1980   HISTORY OF PRESENT ILLNESS: Patient is a 31 year old female here for evaluation of rectal pain and bleeding. Last month patient had several episodes of rectal bleeding with bowel movements. She feels a tear around anus. She has itching and discomfort in the area. No unusual abdominal pain. Sometimes stools difficult to pass. No other gastrointestinal complaints   Past Medical History  Diagnosis Date  . ADHD (attention deficit hyperactivity disorder)   . Allergy   . Asthma    Past Surgical History  Procedure Date  . Wisdom tooth extraction     reports that she has quit smoking. She has never used smokeless tobacco. She reports that she drinks alcohol. She reports that she does not use illicit drugs. family history is not on file.  She is adopted. No Known Allergies    Outpatient Encounter Prescriptions as of 10/15/2011  Medication Sig Dispense Refill  . amphetamine-dextroamphetamine (ADDERALL) 20 MG tablet Take 20 mg by mouth 2 (two) times daily.      . IUD's (PARAGARD INTRAUTERINE COPPER) IUD IUD 1 each by Intrauterine route once.      Marland Kitchen DISCONTD: amphetamine-dextroamphetamine (ADDERALL XR, 20MG ,) 20 MG 24 hr capsule Take 1 capsule (20 mg total) by mouth 2 (two) times daily.  60 capsule  0  . DISCONTD: azithromycin (ZITHROMAX Z-PAK) 250 MG tablet Take 2 tablets (500 mg) on  Day 1,  followed by 1 tablet (250 mg) once daily on Days 2 through 5.  6 each  0  . DISCONTD: amphetamine-dextroamphetamine (ADDERALL XR) 20 MG 24 hr capsule Take 1 capsule (20 mg total) by mouth 2 (two) times daily.  60 capsule  0  . DISCONTD: amphetamine-dextroamphetamine (ADDERALL XR) 20 MG 24 hr capsule Take 1 capsule (20 mg total) by mouth 2 (two) times daily.  60 capsule  0  . DISCONTD: amphetamine-dextroamphetamine (ADDERALL XR, 20MG ,) 20 MG 24 hr capsule Take 1 capsule (20 mg total) by mouth 2 (two) times daily.  60 capsule  0  . DISCONTD:  amphetamine-dextroamphetamine (ADDERALL XR, 20MG ,) 20 MG 24 hr capsule Take 1 capsule (20 mg total) by mouth 2 (two) times daily.  60 capsule  0  . DISCONTD: amphetamine-dextroamphetamine (ADDERALL XR, 20MG ,) 20 MG 24 hr capsule Take 1 capsule (20 mg total) by mouth 2 (two) times daily.  60 capsule  0  . DISCONTD: amphetamine-dextroamphetamine (ADDERALL XR, 20MG ,) 20 MG 24 hr capsule Take 1 capsule (20 mg total) by mouth 2 (two) times daily.  60 capsule  0  . DISCONTD: esomeprazole (NEXIUM) 40 MG capsule Take 1 capsule (40 mg total) by mouth daily.  30 capsule  1  . DISCONTD: hydrocortisone (ANUSOL-HC) 25 MG suppository Place 25 mg rectally every 4 (four) hours.           REVIEW OF SYSTEMS  : All other systems reviewed and negative except where noted in the History of Present Illness.   PHYSICAL EXAM: BP 118/68  Pulse 62  Ht 5\' 4"  (1.626 m)  Wt 126 lb (57.153 kg)  BMI 21.63 kg/m2  LMP 09/16/2011 General: Well developed asian female in no acute distress Head: Normocephalic and atraumatic Eyes:  sclerae anicteric,conjunctive pink. Ears: Normal auditory acuity Neck: Supple, no masses.  Lungs: Clear throughout to auscultation Heart: Regular rate and rhythm Abdomen: Soft, nontender, non distended. No masses or hepatomegaly noted. Normal bowel sounds Rectal: Midline posterior fissure. Fissure extends a few millimeters into the anal canal as seen on anoscopy area with  scant bloody drainage, not significantly tender in the area Musculoskeletal: Symmetrical with no gross deformities  Skin: No lesions on visible extremities Extremities: No edema  Neurological: Alert oriented x 4, grossly nonfocal Cervical Nodes:  No significant cervical adenopathy Psychological:  Alert and cooperative. Normal mood and affect  ASSESSMENT AND PLAN:  1. Anal fissure.  Will treat with diltiazem gel 3 times daily, sitz baths and trial of lidocaine gel for pain relief. Need to avoid constipation. Followup here in  one month  2. intermittent constipation with hard stools. Will add daily fiber, two stool softeners daily. If stools still hard patient add daily MiraLax.

## 2011-10-26 ENCOUNTER — Ambulatory Visit (INDEPENDENT_AMBULATORY_CARE_PROVIDER_SITE_OTHER): Payer: BC Managed Care – PPO | Admitting: Family Medicine

## 2011-10-26 ENCOUNTER — Telehealth: Payer: Self-pay | Admitting: Family Medicine

## 2011-10-26 DIAGNOSIS — Z23 Encounter for immunization: Secondary | ICD-10-CM

## 2011-10-26 MED ORDER — AMPHETAMINE-DEXTROAMPHETAMINE 20 MG PO TABS: 20.0000 mg | ORAL_TABLET | Freq: Two times a day (BID) | ORAL | Status: DC

## 2011-10-26 NOTE — Telephone Encounter (Signed)
Pt is req 3 month script for amphetamine-dextroamphetamine (ADDERALL) 20 MG tablet. Pt req to pick up asap this wk. Pt said that her father or mother, Rosalia Hammers or Tyler Aas, when ready.

## 2011-10-26 NOTE — Telephone Encounter (Signed)
Pt is here to pick up.  

## 2011-10-26 NOTE — Telephone Encounter (Signed)
done

## 2011-11-03 ENCOUNTER — Encounter: Payer: Self-pay | Admitting: Family Medicine

## 2011-11-03 ENCOUNTER — Ambulatory Visit (INDEPENDENT_AMBULATORY_CARE_PROVIDER_SITE_OTHER): Payer: BC Managed Care – PPO | Admitting: Family Medicine

## 2011-11-03 VITALS — BP 108/66 | HR 82 | Temp 98.6°F | Wt 130.0 lb

## 2011-11-03 DIAGNOSIS — G589 Mononeuropathy, unspecified: Secondary | ICD-10-CM

## 2011-11-03 DIAGNOSIS — G629 Polyneuropathy, unspecified: Secondary | ICD-10-CM

## 2011-11-03 LAB — HEPATIC FUNCTION PANEL
ALT: 12 U/L (ref 0–35)
Total Bilirubin: 0.4 mg/dL (ref 0.3–1.2)

## 2011-11-03 LAB — CBC WITH DIFFERENTIAL/PLATELET
Eosinophils Relative: 0.2 % (ref 0.0–5.0)
HCT: 41 % (ref 36.0–46.0)
Hemoglobin: 13.3 g/dL (ref 12.0–15.0)
Lymphocytes Relative: 33 % (ref 12.0–46.0)
Lymphs Abs: 2.4 10*3/uL (ref 0.7–4.0)
Monocytes Relative: 5.1 % (ref 3.0–12.0)
Platelets: 207 10*3/uL (ref 150.0–400.0)
WBC: 7.1 10*3/uL (ref 4.5–10.5)

## 2011-11-03 LAB — BASIC METABOLIC PANEL
BUN: 12 mg/dL (ref 6–23)
Creatinine, Ser: 0.7 mg/dL (ref 0.4–1.2)
GFR: 103.35 mL/min (ref >60.00)

## 2011-11-03 LAB — TSH: TSH: 0.76 u[IU]/mL (ref 0.35–5.50)

## 2011-11-03 LAB — VITAMIN B12: Vitamin B-12: >1500 pg/mL — ABNORMAL HIGH (ref 211–911)

## 2011-11-03 NOTE — Progress Notes (Signed)
  Subjective:    Patient ID: Joyce Cortez, female    DOB: 06-28-1980, 31 y.o.   MRN: [id number]  HPI Here for burning and tingling in her feet, especially the toes. This started several weeks ago after she was on her feet all day while wearing a tight pair of boots. She has been wearing a pair of sandals the past 3 days and her feet feel a little better. No redness or swelling.    Review of Systems  Constitutional: Negative.        Objective:   Physical Exam  Constitutional: She is oriented to person, place, and time. She appears well-developed and well-nourished.  Cardiovascular: Intact distal pulses.   Musculoskeletal:       Both feet appear normal, good blood flow. No erythema or warmth or swelling. Skin is intact   Neurological: She is alert and oriented to person, place, and time. She has normal reflexes. No cranial nerve deficit. She exhibits normal muscle tone. Coordination normal.          Assessment & Plan:  This is probably the result of a compressive injury from wearing the tight boots, but we will screen for possible sources of neuropathy. She will wear loose fitting shoes.

## 2011-11-04 ENCOUNTER — Ambulatory Visit: Payer: BC Managed Care – PPO | Admitting: Internal Medicine

## 2011-11-04 ENCOUNTER — Telehealth: Payer: Self-pay | Admitting: Family Medicine

## 2011-11-04 NOTE — Telephone Encounter (Signed)
Patient called stating that she would like to be referred to a Podiatrist for the feeling loss in her left foot and toes. Please advise/assist.

## 2011-11-04 NOTE — Telephone Encounter (Signed)
Disreguard

## 2011-11-05 NOTE — Progress Notes (Signed)
Quick Note:  I left a voice message with results. ______ 

## 2011-11-16 ENCOUNTER — Ambulatory Visit: Payer: BC Managed Care – PPO | Admitting: Internal Medicine

## 2011-11-24 ENCOUNTER — Telehealth: Payer: Self-pay | Admitting: Family Medicine

## 2011-11-24 MED ORDER — AMPHETAMINE-DEXTROAMPHET ER 20 MG PO CP24: 20.0000 mg | ORAL_CAPSULE | Freq: Two times a day (BID) | ORAL | Status: DC

## 2011-11-24 NOTE — Telephone Encounter (Signed)
Caller: [name]/Patient; Patient Name: Joyce Cortez; PCP: [facility]); Best Callback Phone Number: [phone]; Reason for call: Other Is wanting Nexium sent to pharmacy in [facility] 11-6 as has been taking Adderall tablets instead of capsules. Has caused "upset stomache". No vomiting. Feels nauseated. Thinks medicine would help with abdominal discomfort. Refuses to try Pepcid or Zantac.. Is leaving to go to [facility] in 1 hour. Advised appointment 11-7 but states will be in [place]. Is upset and states "nothing has changed except stomache pain" and began after starting tablets.  PLEASE CALL PATIENT AND ADVISE IF CAN CALL IN NEXIUM WITHOUT APPOINTMENT

## 2011-11-24 NOTE — Telephone Encounter (Signed)
Patient called stating that she received the adderall tabs and she has been receiving the adderall xr caps. Please advise/assist.

## 2011-11-24 NOTE — Telephone Encounter (Signed)
done

## 2011-11-25 NOTE — Telephone Encounter (Signed)
I spoke with pt and she would like a refill on Nexium and she is going to call back with the pharmacy name & number. Also when she picks up the Adderall script she was going to ask the pharmacy if she can exchange the tablets for the capsules.

## 2011-11-25 NOTE — Telephone Encounter (Signed)
Call in Nexium 40 mg daily for one year. 

## 2011-11-26 MED ORDER — ESOMEPRAZOLE MAGNESIUM 40 MG PO CPDR: 40.0000 mg | DELAYED_RELEASE_CAPSULE | Freq: Every day | ORAL | Status: DC

## 2011-11-26 NOTE — Telephone Encounter (Signed)
This was called in

## 2011-11-26 NOTE — Telephone Encounter (Signed)
Patient would like to have the nexium called into [facility] in [facility]. Please assist.

## 2011-12-03 ENCOUNTER — Ambulatory Visit: Payer: BC Managed Care – PPO | Admitting: Internal Medicine

## 2011-12-10 ENCOUNTER — Telehealth: Payer: Self-pay | Admitting: Family Medicine

## 2011-12-10 MED ORDER — AMPHETAMINE-DEXTROAMPHET ER 20 MG PO CP24: 20.0000 mg | ORAL_CAPSULE | Freq: Two times a day (BID) | ORAL | Status: DC

## 2011-12-10 NOTE — Telephone Encounter (Signed)
Written for capsules as above

## 2011-12-10 NOTE — Telephone Encounter (Signed)
Pt would like new script for Adderall capsules. She is brought back the remainder of Adderall tablets. She cannot take these tablets.

## 2011-12-10 NOTE — Telephone Encounter (Signed)
Script is ready for pick up and pt is here.

## 2012-02-04 ENCOUNTER — Ambulatory Visit (INDEPENDENT_AMBULATORY_CARE_PROVIDER_SITE_OTHER): Payer: BC Managed Care – PPO | Admitting: Family Medicine

## 2012-02-04 ENCOUNTER — Encounter: Payer: Self-pay | Admitting: Family Medicine

## 2012-02-04 VITALS — BP 110/70 | HR 91 | Temp 98.4°F | Wt 134.0 lb

## 2012-02-04 DIAGNOSIS — J329 Chronic sinusitis, unspecified: Secondary | ICD-10-CM

## 2012-02-04 MED ORDER — AZITHROMYCIN 250 MG PO TABS: ORAL_TABLET | ORAL | Status: DC

## 2012-02-04 NOTE — Progress Notes (Signed)
  Subjective:    Patient ID: Joyce Cortez, female    DOB: 1980-03-31, 32 y.o.   MRN: [id number]  HPI Here for one week of sinus pressure, PND, ST, and a dr cough. No fever. On Nyquil and Dayquil.    Review of Systems  Constitutional: Negative.   HENT: Positive for congestion, postnasal drip and sinus pressure.   Eyes: Negative.   Respiratory: Positive for cough.        Objective:   Physical Exam  Constitutional: She appears well-developed and well-nourished.  HENT:  Right Ear: External ear normal.  Left Ear: External ear normal.  Nose: Nose normal.  Mouth/Throat: Oropharynx is clear and moist.  Eyes: Conjunctivae normal are normal.  Pulmonary/Chest: Effort normal and breath sounds normal.  Lymphadenopathy:    She has no cervical adenopathy.          Assessment & Plan:  Recheck prn

## 2012-02-15 ENCOUNTER — Other Ambulatory Visit: Payer: Self-pay | Admitting: Family Medicine

## 2012-02-15 MED ORDER — AMPHETAMINE-DEXTROAMPHETAMINE 20 MG PO TABS: 20.0000 mg | ORAL_TABLET | Freq: Two times a day (BID) | ORAL | Status: DC

## 2012-02-15 NOTE — Telephone Encounter (Signed)
Pt would like to switch from amphetamine-dextroamphetamine (ADDERALL XR) 20 MG 24 To the tablets. Pt says they are less expensive. Pt needs to pick up script tomorrow 1/29. Thank you

## 2012-02-15 NOTE — Telephone Encounter (Signed)
Done for one month so she can try it  ?

## 2012-02-16 NOTE — Telephone Encounter (Signed)
Script is ready for pick up and I left voice message for pt.

## 2012-03-17 ENCOUNTER — Telehealth: Payer: Self-pay | Admitting: Family Medicine

## 2012-03-17 MED ORDER — AMPHETAMINE-DEXTROAMPHETAMINE 20 MG PO TABS: 20.0000 mg | ORAL_TABLET | Freq: Two times a day (BID) | ORAL | Status: DC

## 2012-03-17 NOTE — Telephone Encounter (Signed)
Scripts are ready for pick up and I spoke with pt. 

## 2012-03-17 NOTE — Telephone Encounter (Signed)
done

## 2012-03-17 NOTE — Telephone Encounter (Signed)
Pt needs refill of amphetamine-dextroamphetamine (ADDERALL) 20 MG tablet

## 2012-03-20 ENCOUNTER — Emergency Department (HOSPITAL_COMMUNITY)
Admission: EM | Admit: 2012-03-20 | Discharge: 2012-03-20 | Disposition: A | Discharge status: Home / Self Care | Payer: BC Managed Care – PPO | Attending: Emergency Medicine | Admitting: Emergency Medicine

## 2012-03-20 ENCOUNTER — Encounter (HOSPITAL_COMMUNITY): Payer: Self-pay | Admitting: Emergency Medicine

## 2012-03-20 DIAGNOSIS — Z79899 Other long term (current) drug therapy: Secondary | ICD-10-CM | POA: Insufficient documentation

## 2012-03-20 DIAGNOSIS — J45909 Unspecified asthma, uncomplicated: Secondary | ICD-10-CM | POA: Insufficient documentation

## 2012-03-20 DIAGNOSIS — R221 Localized swelling, mass and lump, neck: Secondary | ICD-10-CM | POA: Insufficient documentation

## 2012-03-20 DIAGNOSIS — F909 Attention-deficit hyperactivity disorder, unspecified type: Secondary | ICD-10-CM | POA: Insufficient documentation

## 2012-03-20 DIAGNOSIS — R220 Localized swelling, mass and lump, head: Secondary | ICD-10-CM | POA: Insufficient documentation

## 2012-03-20 DIAGNOSIS — T4995XA Adverse effect of unspecified topical agent, initial encounter: Secondary | ICD-10-CM | POA: Insufficient documentation

## 2012-03-20 DIAGNOSIS — IMO0001 Reserved for inherently not codable concepts without codable children: Secondary | ICD-10-CM | POA: Insufficient documentation

## 2012-03-20 DIAGNOSIS — T7840XA Allergy, unspecified, initial encounter: Secondary | ICD-10-CM

## 2012-03-20 DIAGNOSIS — Z87891 Personal history of nicotine dependence: Secondary | ICD-10-CM | POA: Insufficient documentation

## 2012-03-20 MED ORDER — EPINEPHRINE 0.3 MG/0.3ML IJ DEVI: 0.3000 mg | Freq: Once | INTRAMUSCULAR | Status: DC

## 2012-03-20 NOTE — ED Provider Notes (Signed)
History     This chart was scribed for Joyce Munch, MD, MD by Smitty Pluck, ED Scribe. The patient was seen in room TR11C/TR11C and the patient's care was started at 6:05 PM.   CSN: [id number]  Arrival date & time 03/20/12  1719      Chief Complaint  Patient presents with  . Allergic Reaction     The history is provided by the patient. No language interpreter was used.   Joyce Cortez is a 32 y.o. female who presents to the Emergency Department complaining of moderate facial swelling, facial redness and SOB with sudden onset today 4 hours ago. Pt reports associated itching over entire body. She states the symptoms were constant for 2 hours. Pt reports as soon as she felt her throat closing up she took allegra and benadryl with relief. She reports hx of seasonal allergies but has not had similar hx of symptoms. Reports that the only intake she had today was ginger beer, garlic and an olive but has eaten these items in the past without problem. She denies using new soap, new detergent, eating new foods, fever, chills, nausea, vomiting, diarrhea, weakness, cough, SOB and any other pain. Pt reports having fibromyalgia but does not take medication for it.   Past Medical History  Diagnosis Date  . ADHD (attention deficit hyperactivity disorder)   . Allergy   . Asthma     Past Surgical History  Procedure Laterality Date  . Wisdom tooth extraction      Family History  Problem Relation Age of Onset  . Adopted: Yes    History  Substance Use Topics  . Smoking status: Former Games developer  . Smokeless tobacco: Never Used  . Alcohol Use: Yes     Comment: 1-2 drinks a week    OB History   Grav Para Term Preterm Abortions TAB SAB Ect Mult Living                  Review of Systems  Constitutional: Negative for fever and chills.  Respiratory: Negative for shortness of breath.   Gastrointestinal: Negative for nausea and vomiting.  Neurological: Negative for weakness.  All other  systems reviewed and are negative.    Allergies  Review of patient's allergies indicates no known allergies.  Home Medications   Current Outpatient Rx  Name  Route  Sig  Dispense  Refill  . amphetamine-dextroamphetamine (ADDERALL) 20 MG tablet   Oral   Take 1 tablet (20 mg total) by mouth 2 (two) times daily.   60 tablet   0     May fill on 05-15-12   . azithromycin (ZITHROMAX) 250 MG tablet      As directed   6 tablet   0   . esomeprazole (NEXIUM) 40 MG capsule   Oral   Take 1 capsule (40 mg total) by mouth daily.   90 capsule   3   . fexofenadine (ALLEGRA) 180 MG tablet   Oral   Take 180 mg by mouth daily.         . IUD's (PARAGARD INTRAUTERINE COPPER) IUD IUD   Intrauterine   1 each by Intrauterine route once.         . lidocaine (XYLOCAINE) 2 % jelly   Topical   Apply topically 3 (three) times daily. Apply to rectal area 3 times daily as needed for rectal pain.   30 mL   1     BP 112/78  Pulse 96  Temp(Src) 97.9  F (36.6 C) (Oral)  Resp 18  SpO2 96%  Physical Exam  Nursing note and vitals reviewed. Constitutional: She is oriented to person, place, and time. She appears well-developed and well-nourished. No distress.  HENT:  Head: Normocephalic and atraumatic.  Mouth/Throat: Uvula is midline and oropharynx is clear and moist.  Eyes: Conjunctivae and EOM are normal.  Neck: Neck supple. No tracheal deviation present.  Cardiovascular: Normal rate, regular rhythm and normal heart sounds.   No murmur heard. Pulmonary/Chest: Effort normal and breath sounds normal. No respiratory distress. She has no wheezes. She has no rales.  Musculoskeletal: Normal range of motion.  Neurological: She is alert and oriented to person, place, and time.  Skin: Skin is warm and dry.  Psychiatric: She has a normal mood and affect. Her behavior is normal.    ED Course  Procedures (including critical care time) DIAGNOSTIC STUDIES: Oxygen Saturation is 96% on room  air, adequate by my interpretation.    COORDINATION OF CARE: 6:11 PM Discussed ED treatment with pt and pt agrees.     Labs Reviewed - No data to display No results found.   No diagnosis found.    MDM   I personally performed the services described in this documentation, which was scribed in my presence. The recorded information has been reviewed and is accurate.   This well-appearing young female presents with concern of allergic reaction.  Given her description of diffuse pruritus, the sensation of throat fullness, and periorbital swelling, her episode is most consistent with an allergic reaction.  However, the patient has been well for several hours, and on my exam has a clear oropharynx, is not hypoxic, nor tachypneic, and in no distress.  The patient has not taken steroids, and has not used EpiPen, so there is low suspicion for rebound phenomena discussed return to office, and patient was started on a course of antihistamines with EpiPen provided for acute events, and allergy followup.   Joyce Munch, MD 03/20/12 [id number]

## 2012-03-20 NOTE — ED Notes (Signed)
Pt c/o swelling eyes and itching starting today; unknown cause; pt sts itching in throat; no SOB noted

## 2012-05-26 ENCOUNTER — Telehealth: Payer: Self-pay | Admitting: Family Medicine

## 2012-05-26 MED ORDER — AMPHETAMINE-DEXTROAMPHETAMINE 20 MG PO TABS: 20.0000 mg | ORAL_TABLET | Freq: Two times a day (BID) | ORAL | Status: DC

## 2012-05-26 NOTE — Telephone Encounter (Signed)
Pt tore off the date (year) of her script by accident. (Other half at home in [facility])  Pt is in [place] working. Pharm will not fill w/out year.   Pt would like to know if MD could PLEASE run a copy of amphetamine-dextroamphetamine (ADDERALL) 20 MG tablet script   and let her parents pick it up today. Parents will overnite to her. This was pt's last script of 3. Parents: Mr Burdick :  [phone]

## 2012-05-26 NOTE — Telephone Encounter (Signed)
done

## 2012-06-30 ENCOUNTER — Telehealth: Payer: Self-pay | Admitting: Family Medicine

## 2012-06-30 MED ORDER — AMPHETAMINE-DEXTROAMPHETAMINE 20 MG PO TABS: 20.0000 mg | ORAL_TABLET | Freq: Two times a day (BID) | ORAL | Status: DC

## 2012-06-30 NOTE — Telephone Encounter (Signed)
done

## 2012-06-30 NOTE — Telephone Encounter (Signed)
PT came in to pick up her amphetamine-dextroamphetamine (ADDERALL) 20 MG tablet, and requested another 2 month supply. Please assist.

## 2012-06-30 NOTE — Telephone Encounter (Signed)
Script is ready for pick up and I did speak with pt.

## 2012-06-30 NOTE — Telephone Encounter (Signed)
Pt needs refill amphetamine-dextroamphetamine (ADDERALL) 20 MG tablet    BID

## 2012-07-03 MED ORDER — AMPHETAMINE-DEXTROAMPHETAMINE 20 MG PO TABS: 20.0000 mg | ORAL_TABLET | Freq: Two times a day (BID) | ORAL | Status: DC

## 2012-07-03 NOTE — Telephone Encounter (Signed)
done

## 2012-07-03 NOTE — Telephone Encounter (Signed)
Script is ready for pick up, tried to reach pt and no answer.  

## 2012-07-26 ENCOUNTER — Telehealth: Payer: Self-pay | Admitting: Family Medicine

## 2012-07-26 NOTE — Telephone Encounter (Signed)
I spoke with Joyce Cortez and she can wait until Dr. Clent Ridges returns to office. She is really not sure if she has another script somewhere or not?

## 2012-07-26 NOTE — Telephone Encounter (Signed)
Pt needs new rx generic ADDERALL 20 MG twice a day. #60

## 2012-08-04 NOTE — Telephone Encounter (Signed)
NO, she has refills to last until 10-03-12. If she says that she has lost them again, then I will refuse to prescribe this any more and she will have to seek another provider to get them

## 2012-08-04 NOTE — Telephone Encounter (Signed)
As I said before, no refills until 10-03-12.

## 2012-08-04 NOTE — Telephone Encounter (Signed)
I left voice message, no more refills until below date. I advised pt to call 3 days before that date and maybe Dr. Clent Ridges would give a 1 month supply at a time.

## 2012-08-04 NOTE — Telephone Encounter (Signed)
I spoke with pt and she does not have any more scripts for Adderall.

## 2012-09-11 ENCOUNTER — Encounter: Payer: Self-pay | Admitting: Family Medicine

## 2012-09-11 ENCOUNTER — Ambulatory Visit (INDEPENDENT_AMBULATORY_CARE_PROVIDER_SITE_OTHER): Payer: BC Managed Care – PPO | Admitting: Family Medicine

## 2012-09-11 VITALS — BP 108/70 | HR 110 | Temp 98.5°F | Wt 130.0 lb

## 2012-09-11 DIAGNOSIS — F909 Attention-deficit hyperactivity disorder, unspecified type: Secondary | ICD-10-CM

## 2012-09-11 DIAGNOSIS — L700 Acne vulgaris: Secondary | ICD-10-CM

## 2012-09-11 DIAGNOSIS — L708 Other acne: Secondary | ICD-10-CM

## 2012-09-11 MED ORDER — CLINDAMYCIN PHOS-BENZOYL PEROX 1-5 % EX GEL: Freq: Two times a day (BID) | CUTANEOUS | Status: DC

## 2012-09-11 MED ORDER — AMPHETAMINE-DEXTROAMPHETAMINE 20 MG PO TABS: 20.0000 mg | ORAL_TABLET | Freq: Two times a day (BID) | ORAL | Status: DC

## 2012-09-11 NOTE — Progress Notes (Signed)
  Subjective:    Patient ID: Joyce Cortez, female    DOB: 06/07/1980, 32 y.o.   MRN: [id number]  HPI Here asking for advice about her acne. This is confined to the face. She has been getting oral ampicillin from her GYN for this for the past year but she has had frequent vaginal yeast infections from this. She sees Dr. Sharyn Lull for Dermatology care but asks me if she can try something.    Review of Systems  Constitutional: Negative.        Objective:   Physical Exam  Constitutional: She appears well-developed and well-nourished.  Skin:  Papular acne over the cheeks           Assessment & Plan:  Try Benzaclin gel bid. By avoiding oral antibiotics she could avoid the yeast infections. Refilled Adderall for 90 days

## 2012-10-17 ENCOUNTER — Ambulatory Visit (INDEPENDENT_AMBULATORY_CARE_PROVIDER_SITE_OTHER): Payer: BC Managed Care – PPO | Admitting: Family Medicine

## 2012-10-17 ENCOUNTER — Encounter: Payer: Self-pay | Admitting: Family Medicine

## 2012-10-17 VITALS — BP 124/78 | Temp 98.9°F | Wt 119.0 lb

## 2012-10-17 DIAGNOSIS — B356 Tinea cruris: Secondary | ICD-10-CM

## 2012-10-17 DIAGNOSIS — M797 Fibromyalgia: Secondary | ICD-10-CM

## 2012-10-17 DIAGNOSIS — IMO0001 Reserved for inherently not codable concepts without codable children: Secondary | ICD-10-CM

## 2012-10-17 MED ORDER — CARISOPRODOL 350 MG PO TABS: 350.0000 mg | ORAL_TABLET | Freq: Four times a day (QID) | ORAL | Status: DC | PRN

## 2012-10-17 MED ORDER — KETOCONAZOLE 2 % EX CREA: TOPICAL_CREAM | Freq: Two times a day (BID) | CUTANEOUS | Status: DC

## 2012-10-17 NOTE — Progress Notes (Signed)
  Subjective:    Patient ID: Joyce Cortez, female    DOB: [birth year], 32 y.o.   MRN: [id number]  HPI Here for 2 things. First she asks to try another muscle relaxer for her fibromyalgia. She has tried Flexeril and a few others over the years and they cause more sedation than anything else. She says the most effective thing she has tried was smoking marijuana, but she would like to avoid this if possible.  She wants to try Soma. Also she has had an itchy rash between the buttocks off and on this summer.    Review of Systems  Constitutional: Negative.   Musculoskeletal: Positive for myalgias.  Skin: Positive for rash.       Objective:   Physical Exam  Constitutional: She appears well-developed and well-nourished.  Musculoskeletal: Normal range of motion. She exhibits no edema and no tenderness.          Assessment & Plan:  Try Soma prn. Try ketoconazole cream prn.

## 2012-11-13 ENCOUNTER — Encounter: Payer: Self-pay | Admitting: Family Medicine

## 2012-11-13 ENCOUNTER — Ambulatory Visit (INDEPENDENT_AMBULATORY_CARE_PROVIDER_SITE_OTHER): Payer: BC Managed Care – PPO

## 2012-11-13 ENCOUNTER — Ambulatory Visit (INDEPENDENT_AMBULATORY_CARE_PROVIDER_SITE_OTHER): Payer: BC Managed Care – PPO | Admitting: Family Medicine

## 2012-11-13 VITALS — BP 110/70 | HR 77 | Temp 97.8°F | Wt 132.0 lb

## 2012-11-13 DIAGNOSIS — F411 Generalized anxiety disorder: Secondary | ICD-10-CM

## 2012-11-13 DIAGNOSIS — IMO0001 Reserved for inherently not codable concepts without codable children: Secondary | ICD-10-CM

## 2012-11-13 DIAGNOSIS — L708 Other acne: Secondary | ICD-10-CM

## 2012-11-13 DIAGNOSIS — M797 Fibromyalgia: Secondary | ICD-10-CM

## 2012-11-13 DIAGNOSIS — F909 Attention-deficit hyperactivity disorder, unspecified type: Secondary | ICD-10-CM

## 2012-11-13 DIAGNOSIS — Z23 Encounter for immunization: Secondary | ICD-10-CM

## 2012-11-13 DIAGNOSIS — L700 Acne vulgaris: Secondary | ICD-10-CM

## 2012-11-13 NOTE — Progress Notes (Signed)
  Subjective:    Patient ID: Joyce Cortez, female    DOB: [birth year], 32 y.o.   MRN: [id number]  HPI Here for several issues. First she is taking several OTC supplements and juices, and she asks if these could cause any interactions with her other meds. Also she has had a bump on her lower lip that came up 3 weeks ago. It opened and drained several days ago and now it seems to be going down. She also asks me for ideas about a medication that she could use to relax on a prn basis.    Review of Systems  Constitutional: Negative.   Respiratory: Negative.   Cardiovascular: Negative.   Psychiatric/Behavioral: Negative for dysphoric mood. The patient is nervous/anxious.        Objective:   Physical Exam  Constitutional: She is oriented to person, place, and time. She appears well-developed and well-nourished.  Neurological: She is alert and oriented to person, place, and time.  Skin:  The lower lip has a partially resolved pustule that appears to have been from acne   Psychiatric: She has a normal mood and affect. Her behavior is normal. Thought content normal.          Assessment & Plan:  The facial pustule is resolving. All her supplements seem to be safe to use. I suggested something like Ativan to use for prn anxiety, but I told her that this would need to come from Dr. Evelene Croon instead of [place].

## 2012-11-28 ENCOUNTER — Telehealth: Payer: Self-pay | Admitting: Family Medicine

## 2012-11-28 NOTE — Telephone Encounter (Signed)
Pt thought she needed another adderall rx, but she should have. Pt thinks she may be out.

## 2012-11-29 ENCOUNTER — Telehealth: Payer: Self-pay | Admitting: Family Medicine

## 2012-11-29 NOTE — Telephone Encounter (Signed)
NO I could not prescribe this and I do not know who would

## 2012-11-29 NOTE — Telephone Encounter (Signed)
Pt stopped by to ask if dr fry could rx her the med MARINOL.  Pt states her psychiatrist told her she could work w/ her on any med she could find that would help her symptoms, and pt came up w/ this. Pt states if dr fry can not rx, could he tell her who will? Pt states w/ fibromyalgia and her ptsd, this seemed like a good med. pls advise.

## 2012-11-30 NOTE — Telephone Encounter (Signed)
I left voice message with below information. 

## 2012-12-19 ENCOUNTER — Telehealth: Payer: Self-pay | Admitting: Family Medicine

## 2012-12-19 NOTE — Telephone Encounter (Signed)
Pt needs refill on Adderall

## 2012-12-20 MED ORDER — AMPHETAMINE-DEXTROAMPHETAMINE 20 MG PO TABS: 20.0000 mg | ORAL_TABLET | Freq: Two times a day (BID) | ORAL | Status: DC

## 2012-12-20 NOTE — Telephone Encounter (Signed)
Written for one month only. She needs testing and a contract

## 2012-12-20 NOTE — Telephone Encounter (Signed)
Called and spoke with pt and pt is aware rx ready for pick up.  Contract printed and placed on envelope.

## 2012-12-22 ENCOUNTER — Encounter: Payer: Self-pay | Admitting: Family Medicine

## 2012-12-22 ENCOUNTER — Ambulatory Visit (INDEPENDENT_AMBULATORY_CARE_PROVIDER_SITE_OTHER): Payer: BC Managed Care – PPO | Admitting: Family Medicine

## 2012-12-22 VITALS — BP 112/70 | HR 67 | Temp 98.4°F | Wt 124.0 lb

## 2012-12-22 DIAGNOSIS — J019 Acute sinusitis, unspecified: Secondary | ICD-10-CM

## 2012-12-22 MED ORDER — AZITHROMYCIN 250 MG PO TABS: ORAL_TABLET | ORAL | Status: DC

## 2012-12-22 NOTE — Progress Notes (Signed)
Pre visit review using our clinic review tool, if applicable. No additional management support is needed unless otherwise documented below in the visit note. 

## 2012-12-22 NOTE — Progress Notes (Signed)
   Subjective:    Patient ID: Joyce Cortez, female    DOB: 08/26/1980, 32 y.o.   MRN: [id number]  HPI Here for one wek of fever to 100 degrees, HA, sinus pressure, and a ST. Some coughing.    Review of Systems  Constitutional: Positive for fever and fatigue.  HENT: Positive for congestion, postnasal drip and sinus pressure.   Eyes: Negative.   Respiratory: Positive for cough.        Objective:   Physical Exam  Constitutional: She appears well-developed and well-nourished.  HENT:  Right Ear: External ear normal.  Left Ear: External ear normal.  Nose: Nose normal.  Mouth/Throat: Oropharynx is clear and moist.  Eyes: Conjunctivae are normal.  Pulmonary/Chest: Effort normal and breath sounds normal.  Lymphadenopathy:    She has no cervical adenopathy.          Assessment & Plan:  Add Mucinex

## 2013-01-01 ENCOUNTER — Telehealth: Payer: Self-pay | Admitting: Family Medicine

## 2013-01-01 NOTE — Telephone Encounter (Signed)
Call in another Zpack  

## 2013-01-01 NOTE — Telephone Encounter (Signed)
Pt states she still has symptoms related to sinus infection and is requesting another round of antibiotics sent to the pharmacy, [facility].

## 2013-01-02 MED ORDER — AZITHROMYCIN 250 MG PO TABS: ORAL_TABLET | ORAL | Status: DC

## 2013-01-02 NOTE — Telephone Encounter (Signed)
Zpak sent to [facility].

## 2013-11-12 ENCOUNTER — Encounter: Payer: Self-pay | Admitting: Family Medicine

## 2013-11-12 ENCOUNTER — Ambulatory Visit (INDEPENDENT_AMBULATORY_CARE_PROVIDER_SITE_OTHER): Payer: BC Managed Care – PPO | Admitting: Family Medicine

## 2013-11-12 VITALS — BP 117/75 | HR 76 | Temp 98.6°F | Ht 64.0 in | Wt 139.0 lb

## 2013-11-12 DIAGNOSIS — J0191 Acute recurrent sinusitis, unspecified: Secondary | ICD-10-CM

## 2013-11-12 MED ORDER — AMOXICILLIN-POT CLAVULANATE 875-125 MG PO TABS: 1.0000 | ORAL_TABLET | Freq: Two times a day (BID) | ORAL | Status: DC

## 2013-11-12 MED ORDER — HYDROCODONE-HOMATROPINE 5-1.5 MG/5ML PO SYRP: 5.0000 mL | ORAL_SOLUTION | ORAL | Status: DC | PRN

## 2013-11-12 NOTE — Progress Notes (Signed)
Pre visit review using our clinic review tool, if applicable. No additional management support is needed unless otherwise documented below in the visit note. 

## 2013-11-12 NOTE — Progress Notes (Signed)
   Subjective:    Patient ID: Joyce Cortez, female    DOB: 08/15/1980, 33 y.o.   MRN: [id number]  HPI Here for 3 weeks of sinus pressure, HA, PND, and coughing up yellow sputum. No fever.    Review of Systems  Constitutional: Negative.   HENT: Positive for congestion, postnasal drip and sinus pressure.   Eyes: Negative.   Respiratory: Positive for cough.        Objective:   Physical Exam  Constitutional: She appears well-developed and well-nourished.  HENT:  Right Ear: External ear normal.  Left Ear: External ear normal.  Nose: Nose normal.  Mouth/Throat: Oropharynx is clear and moist.  Eyes: Conjunctivae are normal.  Pulmonary/Chest: Effort normal and breath sounds normal.  Lymphadenopathy:    She has no cervical adenopathy.          Assessment & Plan:  Add Mucinex prn

## 2013-11-26 ENCOUNTER — Telehealth: Payer: Self-pay | Admitting: Family Medicine

## 2013-11-27 ENCOUNTER — Telehealth: Payer: Self-pay | Admitting: Family Medicine

## 2013-11-27 MED ORDER — LEVOFLOXACIN 500 MG PO TABS: 500.0000 mg | ORAL_TABLET | Freq: Every day | ORAL | Status: DC

## 2013-11-27 NOTE — Telephone Encounter (Signed)
Pt called to ask for a zpak or antibiotics. She is having weakness, chest congestion, coughing. She said she had this last year.        [facility]

## 2013-11-27 NOTE — Telephone Encounter (Signed)
Call in Levaquin 500 mg daily for 10 days  

## 2013-11-27 NOTE — Telephone Encounter (Signed)
I sent script e-scribe, tried to call pt and mail box was full.

## 2013-11-28 NOTE — Telephone Encounter (Signed)
Patient Information:  Caller Name: Linda HedgesStefanie  Phone: [phone]  Patient: Joyce Cortez, [place]  Gender: Female  DOB: 10/15/1980  Age: [age] Years  PCP: Gershon CraneFry, [facility](Family Practice)  Pregnant: No  Office Follow Up:  Does the office need to follow up with this patient?: No  Instructions For The Office: N/A  RN Note:  Medication Question answered.  Care advice given  Symptoms  Reason For Call & Symptoms: Patient was seen in office 11/12/13 for sinus infection . She was given Amoxicillen . Medication Question- She completed all medication but became sick again from a [employer] worker. She was coughing productive - white/green , congestion. She called office yesterday and she was called in a  Levaquin Rx.  She reports that she felt really bad and had a fever. She took the Levaquin and went to bed  She reports that she " felt so bad"  that after 12mn , "I felt like my body needed another antibiotic so I took another one.  Dosage is Levaquin 500mg  one tablet daily x10 days.  Patient wants to know since I took 2 pills the first day , do I need a prescription for one more pill? to make the 10 days?  Advised caller No she would not need another pill.  Advised her to take the medication as directed. She states she is better today. No fever.  Reviewed Health History In EMR: N/A  Reviewed Medications In EMR: N/A  Reviewed Allergies In EMR: N/A  Reviewed Surgeries / Procedures: N/A  Date of Onset of Symptoms: 11/28/2013 OB / GYN:  LMP: Unknown  Guideline(s) Used:  No Protocol Available - Sick Adult  Disposition Per Guideline:   Home Care  Reason For Disposition Reached:   Patient's symptoms are safe to treat at home per nursing judgment  Advice Given:  Call Back If:  New symptoms develop  You become worse.  Patient Will Follow Care Advice:  YES

## 2013-11-28 NOTE — Telephone Encounter (Signed)
Looks like FYI, please read.

## 2013-11-29 ENCOUNTER — Telehealth: Payer: Self-pay | Admitting: Family Medicine

## 2013-11-29 ENCOUNTER — Emergency Department (HOSPITAL_COMMUNITY)
Admission: EM | Admit: 2013-11-29 | Discharge: 2013-11-29 | Disposition: A | Discharge status: Home / Self Care | Payer: BC Managed Care – PPO | Attending: Emergency Medicine | Admitting: Emergency Medicine

## 2013-11-29 ENCOUNTER — Encounter (HOSPITAL_COMMUNITY): Payer: Self-pay

## 2013-11-29 ENCOUNTER — Other Ambulatory Visit: Payer: Self-pay

## 2013-11-29 ENCOUNTER — Emergency Department (HOSPITAL_COMMUNITY): Payer: BC Managed Care – PPO

## 2013-11-29 DIAGNOSIS — Z8659 Personal history of other mental and behavioral disorders: Secondary | ICD-10-CM | POA: Diagnosis not present

## 2013-11-29 DIAGNOSIS — M791 Myalgia: Secondary | ICD-10-CM | POA: Diagnosis not present

## 2013-11-29 DIAGNOSIS — Z87891 Personal history of nicotine dependence: Secondary | ICD-10-CM | POA: Diagnosis not present

## 2013-11-29 DIAGNOSIS — Z79899 Other long term (current) drug therapy: Secondary | ICD-10-CM | POA: Diagnosis not present

## 2013-11-29 DIAGNOSIS — Z791 Long term (current) use of non-steroidal anti-inflammatories (NSAID): Secondary | ICD-10-CM | POA: Diagnosis not present

## 2013-11-29 DIAGNOSIS — R079 Chest pain, unspecified: Secondary | ICD-10-CM | POA: Diagnosis present

## 2013-11-29 DIAGNOSIS — B349 Viral infection, unspecified: Secondary | ICD-10-CM | POA: Diagnosis not present

## 2013-11-29 DIAGNOSIS — J45901 Unspecified asthma with (acute) exacerbation: Secondary | ICD-10-CM | POA: Insufficient documentation

## 2013-11-29 LAB — BASIC METABOLIC PANEL
Anion gap: 15 (ref 5–15)
BUN: 15 mg/dL (ref 6–23)
CO2: 26 mEq/L (ref 19–32)
Calcium: 10.2 mg/dL (ref 8.4–10.5)
Chloride: 98 mEq/L (ref 96–112)
Creatinine, Ser: 0.73 mg/dL (ref 0.50–1.10)
GFR calc Af Amer: >90 mL/min (ref >90)
GFR calc non Af Amer: >90 mL/min (ref >90)
Glucose, Bld: 93 mg/dL (ref 70–99)
Potassium: 3.8 mEq/L (ref 3.7–5.3)
Sodium: 139 mEq/L (ref 137–147)

## 2013-11-29 LAB — CBC
HCT: 41.4 % (ref 36.0–46.0)
Hemoglobin: 14.5 g/dL (ref 12.0–15.0)
MCH: 32.2 pg (ref 26.0–34.0)
MCHC: 35 g/dL (ref 30.0–36.0)
MCV: 92 fL (ref 78.0–100.0)
Platelets: 183 10*3/uL (ref 150–400)
RBC: 4.5 MIL/uL (ref 3.87–5.11)
RDW: 12.1 % (ref 11.5–15.5)
WBC: 6.7 10*3/uL (ref 4.0–10.5)

## 2013-11-29 LAB — POC URINE PREG, ED: Preg Test, Ur: NEGATIVE

## 2013-11-29 MED ORDER — AEROCHAMBER PLUS FLO-VU LARGE MISC
1.0000 | Freq: Once | Status: AC
Start: 2013-11-29 — End: 2013-11-29
  Administered 2013-11-29: 1

## 2013-11-29 MED ORDER — ALBUTEROL SULFATE HFA 108 (90 BASE) MCG/ACT IN AERS
2.0000 | INHALATION_SPRAY | RESPIRATORY_TRACT | Status: DC | PRN
  Administered 2013-11-29: 2 via RESPIRATORY_TRACT
  Filled 2013-11-29: qty 6.7

## 2013-11-29 NOTE — ED Notes (Addendum)
Pt has been taking levaquin for a sinus infection. Took yesterday at 12pm and "passed out". didn 't wake up till 1800 or 1830. Felt like her whole body was on fire. States she was coming down the stairs and her knees locked and fell down 3 steps and felt like the walls were fun house walls. Was told to come to the hospital because she was also having cp but she doesn't feel like it is heart related. Also has an IUD but had an irregular period and not sure if she is pregnant.

## 2013-11-29 NOTE — Discharge Instructions (Signed)
You most likely had the flu and will need to rest to recover  Use the inhaler as directed 2 puffs every 4-6 hours for shortness of breath or cough  Please rest through the day but do not stay in the bed all the time  Drink enough fluids Eat a well balanced diet

## 2013-11-29 NOTE — ED Provider Notes (Signed)
CSN: [id number]     Arrival date & time 11/29/13  1737 History   First MD Initiated Contact with Patient 11/29/13 2106     Chief Complaint  Patient presents with  . Nasal Congestion  . Medication Reaction  . Chest Pain     (Consider location/radiation/quality/duration/timing/severity/associated sxs/prior Treatment) HPI Comments: Patient has been il since the beginning of October with various viral illness lastly for the past week has had fever, myalgia, cough, sore throat, extreme fatigue, was out of work for a few days and tried to return but is very fatigued and unable to complete a shift. As a child had asthma, does have seasonal allergies, and gets regular allegry injections   Patient is a 33 y.o. female presenting with chest pain. The history is provided by the patient.  Chest Pain Pain location:  Epigastric Pain quality: aching   Pain radiates to:  Does not radiate Pain radiates to the back: no   Pain severity:  Mild Onset quality:  Gradual Timing:  Intermittent Progression:  Unchanged Chronicity:  New Context: breathing   Relieved by:  Nothing Worsened by:  Coughing Ineffective treatments:  Rest (antibiotics) Associated symptoms: cough, dizziness, fatigue, fever, shortness of breath and weakness   Associated symptoms: no dysphagia, no heartburn and no nausea   Fatigue:    Severity:  Moderate   Timing:  Constant   Progression:  Unchanged Fever:    Timing:  Intermittent   Temp source:  Subjective   Progression:  Improving Shortness of breath:    Severity:  Moderate   Onset quality:  Gradual   Timing:  Constant   Progression:  Unchanged Weakness:    Severity:  Moderate   Onset quality:  Gradual   Chronicity:  New   Timing:  Constant   Progression:  Improving   Past Medical History  Diagnosis Date  . ADHD (attention deficit hyperactivity disorder)   . Allergy   . Asthma   . Fibromyalgia    Past Surgical History  Procedure Laterality Date  . Wisdom  tooth extraction     Family History  Problem Relation Age of Onset  . Adopted: Yes   History  Substance Use Topics  . Smoking status: Former Games developermoker  . Smokeless tobacco: Never Used  . Alcohol Use: Yes     Comment: 1-2 drinks a week   OB History    No data available     Review of Systems  Constitutional: Positive for fever and fatigue.  HENT: Positive for sore throat. Negative for congestion and trouble swallowing.   Respiratory: Positive for cough and shortness of breath.   Cardiovascular: Positive for chest pain.  Gastrointestinal: Negative for heartburn and nausea.  Neurological: Positive for dizziness and weakness.      Allergies  Codeine  Home Medications   Prior to Admission medications   Medication Sig Start Date End Date Taking? Authorizing Provider  baclofen (LIORESAL) 10 MG tablet Take 10 mg by mouth 3 (three) times daily as needed for muscle spasms.    Yes Historical Provider, MD  Benzocaine-Menthol (CEPACOL SORE THROAT MAX NUMB) 15-3.6 MG LOZG Use as directed 1 lozenge in the mouth or throat daily as needed (for sore throat).   Yes Historical Provider, MD  Doxylamine-DM & DM (VICKS DAYQUIL/NYQUIL COUGH) 6.25-15 & 15 MG/15ML LQPK Take 30 mLs by mouth 2 (two) times daily as needed (for cold).    Yes Historical Provider, MD  fexofenadine (ALLEGRA) 180 MG tablet Take 180 mg by mouth  daily.   Yes Historical Provider, MD  IUD's (PARAGARD INTRAUTERINE COPPER) IUD IUD 1 each by Intrauterine route continuous.    Yes Historical Provider, MD  meloxicam (MOBIC) 7.5 MG tablet Take 7.5 mg by mouth daily.   Yes Historical Provider, MD   BP 121/72 mmHg  Pulse 78  Temp(Src) 98.5 F (36.9 C)  Resp 21  SpO2 99%  LMP 11/03/2013 Physical Exam  Constitutional: She appears well-developed and well-nourished.  HENT:  Head: Normocephalic.  Eyes: Pupils are equal, round, and reactive to light.  Neck: Normal range of motion.  Cardiovascular: Normal rate and regular rhythm.    Pulmonary/Chest: Effort normal and breath sounds normal. She exhibits tenderness.  Abdominal: Soft. Bowel sounds are normal.  Musculoskeletal: Normal range of motion.  Lymphadenopathy:    She has no cervical adenopathy.  Neurological: She is alert.  Skin: Skin is warm. No rash noted.  Nursing note and vitals reviewed.   ED Course  Procedures (including critical care time) Labs Review Labs Reviewed  CBC  BASIC METABOLIC PANEL  POC URINE PREG, ED    Imaging Review Dg Chest 2 View  11/29/2013   CLINICAL DATA:  Chest pain and labored breathing for 6 days.  EXAM: CHEST  2 VIEW  COMPARISON:  None.  FINDINGS: Trachea is midline. Heart size normal. There may be a calcified granuloma in the left upper lobe. Lungs are otherwise clear. No pleural fluid. Mild dextro convex scoliosis of the thoracic spine.  IMPRESSION: No acute findings.   Electronically Signed   By: Leanna BattlesMelinda  Blietz M.D.   On: 11/29/2013 19:34     EKG Interpretation None     From patient description of symptoms, time line, and progression of illness it appears as though she had the flu that is now getting better will give albuterol inhaler to help with residual cough, tae her out of work for a few days and have her FU with PCP as needed MDM   Final diagnoses:  Viral illness         Arman FilterGail K Alexandre Faries, NP 11/29/13 2209  Raeford RazorStephen Kohut, MD 12/06/13 [phone]

## 2013-11-29 NOTE — Telephone Encounter (Signed)
Patient Information:  Caller Name: Ray  Phone: [phone]  Patient: Lois HuxleyKiszely, [place]  Gender: Female  DOB: 11/30/1980  Age: 33 Years  PCP: Gershon CraneFry, [facility](Family Practice)  Pregnant: No  Office Follow Up:  Does the office need to follow up with this patient?: No  Instructions For The Office: N/A  RN Note:  Father has arrived at the pts. work and needs to know where to take her. [facility] given to him.  Symptoms  Reason For Call & Symptoms: Father is calling to check on where he needs to take his daughter for care. States she called earlier and was told to go to the ED. Pt. with dizziness and chest pain.  Reviewed Health History In EMR: Yes  Reviewed Medications In EMR: Yes  Reviewed Allergies In EMR: Yes  Reviewed Surgeries / Procedures: Yes  Date of Onset of Symptoms: 11/29/2013 OB / GYN:  LMP: Unknown  Guideline(s) Used:  No Protocol Available - Information Only  Disposition Per Guideline:   Home Care  Reason For Disposition Reached:   Information only question and nurse able to answer  Advice Given:  Call Back If:  New symptoms develop  You become worse.  Patient Will Follow Care Advice:  YES

## 2013-11-29 NOTE — Telephone Encounter (Signed)
Noted  

## 2013-11-29 NOTE — Telephone Encounter (Signed)
Called pt at preferred number to see if pt's symtoms had improved or if she planned to seek medial attention.pt's mailbox is full;

## 2013-11-29 NOTE — Telephone Encounter (Signed)
Patient Information:  Caller Name: Linda HedgesStefanie  Phone: [phone]  Patient: Joyce SneddonKiszely, Lakaya Y  Gender: Female  DOB: 03/03/1980  Age: 33 Years  PCP: Gershon CraneFry, [facility](Family Practice)  Pregnant: No  Office Follow Up:  Does the office need to follow up with this patient?: No  Instructions For The Office: N/A   Symptoms  Reason For Call & Symptoms: 11/27/13 Pt had sinus infection and was changed to Levaquin  11/28/13  pt passed out but was already laying down, woke up 4-5 hours later with a  fever, but pt did not take temp.  Pt felt like she was spinning when walking down stairs and fell 2-3 steps.  11/29/13 mild dizziness continues but feels like she is ok to drive.   Pt states she may be somewhat feverish but unsure due to no thermometer.   Feels like she is laboring breathing like she has been running a marathon, states she has constant chest pain in center of chest, rates 3-5/10.   Pulse feels slow and then fast.  Advised pt with her sxs she needs to pull over and call 911 now, pt states she is pulling into work and will go in and talk to employer, reinforced importance of calling 911, pt states she understands but never agreed that she would - only that she would talk to employer.  Reviewed Health History In EMR: Yes  Reviewed Medications In EMR: Yes  Reviewed Allergies In EMR: Yes  Reviewed Surgeries / Procedures: Yes  Date of Onset of Symptoms: 11/28/2013 OB / GYN:  LMP: 11/03/2013  Guideline(s) Used:  Dizziness  Chest Pain  Disposition Per Guideline:   Call EMS 911 Now  Reason For Disposition Reached:   Chest pain lasting longer than 5 minutes and ANY of the following:  Over 31 years old Over 31 years old and at least one cardiac risk factor (i.e., high blood pressure, diabetes, high cholesterol, obesity, smoker or strong family history of heart disease) Pain is crushing, pressure-like, or heavy  Took nitroglycerin and chest pain was not relieved History of heart disease  (i.e., angina, heart attack, bypass surgery, angioplasty, CHF)  Advice Given:  N/A  Patient Will Follow Care Advice:

## 2013-11-30 ENCOUNTER — Telehealth: Payer: Self-pay | Admitting: Family Medicine

## 2013-11-30 NOTE — Telephone Encounter (Signed)
Pt has been sch

## 2013-11-30 NOTE — Telephone Encounter (Signed)
Okay to schedule

## 2013-11-30 NOTE — Telephone Encounter (Signed)
Pt went to [place] and per pt she needs a post er fup Monday. Can I create 30 min slot?

## 2013-12-03 ENCOUNTER — Ambulatory Visit (INDEPENDENT_AMBULATORY_CARE_PROVIDER_SITE_OTHER): Payer: BC Managed Care – PPO | Admitting: Family Medicine

## 2013-12-03 ENCOUNTER — Encounter: Payer: Self-pay | Admitting: Family Medicine

## 2013-12-03 VITALS — BP 117/70 | HR 84 | Temp 98.9°F | Ht 64.0 in | Wt 138.0 lb

## 2013-12-03 DIAGNOSIS — B349 Viral infection, unspecified: Secondary | ICD-10-CM

## 2013-12-03 NOTE — Progress Notes (Signed)
Pre visit review using our clinic review tool, if applicable. No additional management support is needed unless otherwise documented below in the visit note. 

## 2013-12-03 NOTE — Progress Notes (Signed)
   Subjective:    Patient ID: Joyce HuxleyStefanie Cortez, female    DOB: 10/16/1980, 33 y.o.   MRN: [id number]  HPI Here to follow up on an ED visit on 11-29-13. She has been sick for several weeks with viral sx like fevers, aches, ST, and coughing. Then that day while at work she began to feel chest pain that was worse when she coughed. No real SOB. Her workup that day included labs, CXR, and EKG that were all normal. She was told she had a viral infection and go home and rest. Now the chest pains have subsided but she is still very weak and tired. Drinking fluids.    Review of Systems  Constitutional: Positive for fever, chills, diaphoresis and fatigue.  HENT: Positive for congestion and postnasal drip.   Eyes: Negative.   Respiratory: Positive for choking.   Cardiovascular: Positive for chest pain. Negative for palpitations and leg swelling.  Gastrointestinal: Negative.        Objective:   Physical Exam  Constitutional: She appears well-developed.  She appears ill but alert  HENT:  Right Ear: External ear normal.  Left Ear: External ear normal.  Nose: Nose normal.  Mouth/Throat: Oropharynx is clear and moist.  Eyes: Conjunctivae are normal.  Neck: Normal range of motion. Neck supple.  Cardiovascular: Normal rate, regular rhythm, normal heart sounds and intact distal pulses.   Pulmonary/Chest: Effort normal and breath sounds normal. No respiratory distress. She has no wheezes. She has no rales. She exhibits no tenderness.          Assessment & Plan:  This sounds like a viral illness. We will write her out of work from 11-29-13 until 12-07-13. She will rest and I encouraged her to get some food down. Recheck prn

## 2013-12-06 ENCOUNTER — Telehealth: Payer: Self-pay | Admitting: Family Medicine

## 2013-12-06 DIAGNOSIS — R42 Dizziness and giddiness: Secondary | ICD-10-CM

## 2013-12-06 NOTE — Telephone Encounter (Signed)
Pt saw dr Clent Ridgesfry 11/16 for hops fup.  Pt states she is still dizzy.  Pt would like to know if you think should go to an ENT. Pt had sinus inf, upper resp,issues going on since Oct. Pt would also like to know if she could get some antibiotics for acne on her face.  Per pt, she would like to get her health issues resolved prior to her going back to work next week. pls advise if pt needs appt w/ you.

## 2013-12-06 NOTE — Telephone Encounter (Signed)
Pt would a cb Friday to address her issues.

## 2013-12-07 MED ORDER — MECLIZINE HCL 25 MG PO TABS: 25.0000 mg | ORAL_TABLET | ORAL | Status: DC | PRN

## 2013-12-07 NOTE — Telephone Encounter (Signed)
I spoke with pt and sent script e-scribe. She already has a ENT and will contact them. This referral that we put in computer needs to be canceled.

## 2013-12-07 NOTE — Telephone Encounter (Signed)
Can you cancel this referral for ENT?

## 2013-12-07 NOTE — Telephone Encounter (Addendum)
CANCELLED Pt scheduled for 12/17/2013@1 :30 pm  Dr. Kristine Garbehristopher E. Ezzard StandingNewman, MD  Address: [address], [place], [place] [address]  [phone]

## 2013-12-07 NOTE — Telephone Encounter (Signed)
I will refer her to ENT. I would not start her on acne antibiotics now because that may interfere with what the ENT wants to do. Call in Meclizine 25 mg every 4 hours prn dizziness, #60 with 2 rf

## 2013-12-24 ENCOUNTER — Telehealth: Payer: Self-pay | Admitting: Family Medicine

## 2013-12-24 NOTE — Telephone Encounter (Signed)
Pt to see Dr. Clent RidgesFry on 12/25/13 @ 4PM.

## 2013-12-24 NOTE — Telephone Encounter (Signed)
Pt is on our schedule for 12/25/13.

## 2013-12-24 NOTE — Telephone Encounter (Signed)
Patient Information:  Caller Name: Linda HedgesStefanie  Phone: [phone]  Patient: Joyce Cortez, Joyce Cortez  Gender: Female  DOB: 01/31/1980  Age: 33 Years  PCP: Gershon CraneFry, [facility](Family Practice)  Pregnant: No  Office Follow Up:  Does the office need to follow up with this patient?: Yes  Instructions For The Office: Please call back ASAP to advise when she can be seen today.  RN Note:  Informed that no antibiotic can be called in per MD order.  Declined triage. No messages available with Dr Clent RidgesFry and he is in the office.  Urgent message forwarded to office staff for work in appointment.  Needs 20-25 minutes to get to the office.  Please call her back ASAP.   Symptoms  Reason For Call & Symptoms: Sore throat with fatigue.  Pain described as "feels like swallowing glass."  Reviewed Health History In EMR: Yes  Reviewed Medications In EMR: Yes  Reviewed Allergies In EMR: Yes  Reviewed Surgeries / Procedures: Yes  Date of Onset of Symptoms: 12/21/2013  Treatments Tried: rest, lemon water  Treatments Tried Worked: Yes OB / GYN:  LMP: 12/04/2013  Guideline(s) Used:  Sore Throat  No Protocol Available - Sick Adult  Disposition Per Guideline:   See Today in Office  Reason For Disposition Reached:   Patient wants to be seen  Advice Given:  Call Back If:  New symptoms develop  You become worse.  Patient Will Follow Care Advice:  YES

## 2013-12-25 ENCOUNTER — Encounter: Payer: Self-pay | Admitting: Family Medicine

## 2013-12-25 ENCOUNTER — Ambulatory Visit (INDEPENDENT_AMBULATORY_CARE_PROVIDER_SITE_OTHER): Payer: BC Managed Care – PPO | Admitting: Family Medicine

## 2013-12-25 VITALS — BP 110/74 | Temp 98.3°F | Ht 64.0 in | Wt 133.0 lb

## 2013-12-25 DIAGNOSIS — J0101 Acute recurrent maxillary sinusitis: Secondary | ICD-10-CM

## 2013-12-25 MED ORDER — CLARITHROMYCIN 500 MG PO TABS: 500.0000 mg | ORAL_TABLET | Freq: Two times a day (BID) | ORAL | Status: DC

## 2013-12-25 NOTE — Progress Notes (Signed)
Pre visit review using our clinic review tool, if applicable. No additional management support is needed unless otherwise documented below in the visit note. 

## 2013-12-26 ENCOUNTER — Encounter: Payer: Self-pay | Admitting: Family Medicine

## 2013-12-26 NOTE — Progress Notes (Signed)
   Subjective:    Patient ID: Joyce HuxleyStefanie Cortez, female    DOB: 12/09/1980, 33 y.o.   MRN: [id number]  HPI Here for 3 days of sinus pressure, ear pressure, ST, and a dry cough. She had similar sx about 6 weeks ago and was given a Zpack. This helped but they never went away.    Review of Systems  Constitutional: Negative.   HENT: Positive for congestion, ear pain, postnasal drip and sinus pressure.   Eyes: Negative.   Respiratory: Positive for cough.        Objective:   Physical Exam  Constitutional: She appears well-developed and well-nourished.  HENT:  Right Ear: External ear normal.  Left Ear: External ear normal.  Nose: Nose normal.  Mouth/Throat: Oropharynx is clear and moist.  Eyes: Conjunctivae are normal.  Pulmonary/Chest: Effort normal and breath sounds normal.  Lymphadenopathy:    She has no cervical adenopathy.          Assessment & Plan:  Recurrent sinusitis. Given 10 days of Biaxin. Recheck prn

## 2014-08-14 ENCOUNTER — Encounter: Payer: Self-pay | Admitting: Family Medicine

## 2014-08-14 ENCOUNTER — Ambulatory Visit (INDEPENDENT_AMBULATORY_CARE_PROVIDER_SITE_OTHER): Payer: BLUE CROSS/BLUE SHIELD | Admitting: Family Medicine

## 2014-08-14 VITALS — BP 113/82 | HR 80 | Temp 98.5°F | Ht 64.0 in | Wt 136.0 lb

## 2014-08-14 DIAGNOSIS — K219 Gastro-esophageal reflux disease without esophagitis: Secondary | ICD-10-CM

## 2014-08-14 DIAGNOSIS — J309 Allergic rhinitis, unspecified: Secondary | ICD-10-CM | POA: Diagnosis not present

## 2014-08-14 MED ORDER — ALBUTEROL SULFATE HFA 108 (90 BASE) MCG/ACT IN AERS: 2.0000 | INHALATION_SPRAY | RESPIRATORY_TRACT | Status: DC | PRN

## 2014-08-14 MED ORDER — ESOMEPRAZOLE MAGNESIUM 40 MG PO CPDR: 40.0000 mg | DELAYED_RELEASE_CAPSULE | Freq: Every day | ORAL | Status: DC

## 2014-08-14 MED ORDER — METHYLPREDNISOLONE ACETATE 80 MG/ML IJ SUSP
120.0000 mg | Freq: Once | INTRAMUSCULAR | Status: AC
Start: 2014-08-14 — End: 2014-08-14
  Administered 2014-08-14: 120 mg via INTRAMUSCULAR

## 2014-08-14 NOTE — Progress Notes (Signed)
Pre visit review using our clinic review tool, if applicable. No additional management support is needed unless otherwise documented below in the visit note. 

## 2014-08-14 NOTE — Addendum Note (Signed)
Addended by: Aniceto Boss A on: 08/14/2014 02:22 PM   Modules accepted: Orders

## 2014-08-14 NOTE — Progress Notes (Signed)
   Subjective:    Patient ID: Joyce Cortez, female    DOB: 30-May-1980, 34 y.o.   MRN: [id number]  HPI Here for several things. First her GERD as acted up again and she asks for refills for Nexium. Second her allergies are bothering her since she recently moved into her boyfriend's apartment. He has dogs and cats and se is highly allergic to these. She already gets allergy shots. She has trouble tolerating antihistamines.    Review of Systems  Constitutional: Negative.   HENT: Positive for congestion, postnasal drip, rhinorrhea and sinus pressure.   Eyes: Negative.   Respiratory: Negative.   Gastrointestinal: Positive for abdominal pain.       Objective:   Physical Exam  Constitutional: She appears well-developed and well-nourished.  HENT:  Right Ear: External ear normal.  Left Ear: External ear normal.  Nose: Nose normal.  Mouth/Throat: Oropharynx is clear and moist.  Eyes: Conjunctivae are normal.  Neck: Neck supple. No thyromegaly present.  Pulmonary/Chest: Effort normal and breath sounds normal.  Lymphadenopathy:    She has no cervical adenopathy.          Assessment & Plan:  For her allergies she is given a steroid shot. For the GERD she will start back on Nexium.

## 2014-08-26 ENCOUNTER — Ambulatory Visit (INDEPENDENT_AMBULATORY_CARE_PROVIDER_SITE_OTHER): Payer: BLUE CROSS/BLUE SHIELD | Admitting: Family Medicine

## 2014-08-26 ENCOUNTER — Encounter: Payer: Self-pay | Admitting: Family Medicine

## 2014-08-26 VITALS — BP 100/62 | HR 90 | Temp 98.2°F | Ht 64.0 in | Wt 139.6 lb

## 2014-08-26 DIAGNOSIS — K529 Noninfective gastroenteritis and colitis, unspecified: Secondary | ICD-10-CM | POA: Diagnosis not present

## 2014-08-26 DIAGNOSIS — K219 Gastro-esophageal reflux disease without esophagitis: Secondary | ICD-10-CM | POA: Diagnosis not present

## 2014-08-26 NOTE — Progress Notes (Signed)
Pre visit review using our clinic review tool, if applicable. No additional management support is needed unless otherwise documented below in the visit note. 

## 2014-08-26 NOTE — Patient Instructions (Signed)
Follow up with Dr. Clent Ridges in 2-4 weeks for the, sooner if needed  nexium 20 mg daily for 2 weeks - longer if needed  Imodium if needed for diarrhea   Align daily for 4 weeks  Avoid dairy for 1-2 weeks

## 2014-08-26 NOTE — Progress Notes (Signed)
HPI:  Joyce Cortez is a 34 yo patient of Dr. Clent Ridges here for an acute visit for:  "Stomach issues": -acid reflux chronic- saw PCP for this recently on 08/14/14 -on PPI intermittently, restarted and was doing better, then stopped -ate a ceasar salad last week and was sick for 3 days last week with watery diarrhea, nausea,vomiting and fever -only one episode of streak of blood on TP - has had some burning and itching in rectum from diarrhea -now diarrhea and vomiting have resolved but still having some loose stools, some acid issues -able to tolerate fluids -no blood on stools  ROS: See pertinent positives and negatives per HPI.  Past Medical History  Diagnosis Date  . ADHD (attention deficit hyperactivity disorder)   . Allergy   . Asthma   . Fibromyalgia     Past Surgical History  Procedure Laterality Date  . Wisdom tooth extraction      Family History  Problem Relation Age of Onset  . Adopted: Yes    History   Social History  . Marital Status: Single    Spouse Name: N/A  . Number of Children: 0  . Years of Education: N/A   Occupational History  . Independent Contractor    Social History Main Topics  . Smoking status: Current Some Day Smoker  . Smokeless tobacco: Never Used  . Alcohol Use: 0.0 oz/week    0 Standard drinks or equivalent per week     Comment: 1-2 drinks a week  . Drug Use: Yes    Special: Marijuana     Comment: occ  . Sexual Activity: Not on file   Other Topics Concern  . None   Social History Narrative     Current outpatient prescriptions:  .  esomeprazole (NEXIUM) 40 MG capsule, Take 1 capsule (40 mg total) by mouth daily., Disp: 90 capsule, Rfl: 3 .  fexofenadine (ALLEGRA) 180 MG tablet, Take 180 mg by mouth daily., Disp: , Rfl:  .  IUD's (PARAGARD INTRAUTERINE COPPER) IUD IUD, 1 each by Intrauterine route continuous. , Disp: , Rfl:  .  meloxicam (MOBIC) 7.5 MG tablet, Take 7.5 mg by mouth daily., Disp: , Rfl:   EXAM:  Filed  Vitals:   08/26/14 1605  BP: 100/62  Pulse: 90  Temp: 98.2 F (36.8 C)    Body mass index is 23.95 kg/(m^2).  GENERAL: vitals reviewed and listed above, alert, oriented, appears well hydrated and in no acute distress  HEENT: atraumatic, conjunttiva clear, no obvious abnormalities on inspection of external nose and ears  NECK: no obvious masses on inspection  LUNGS: clear to auscultation bilaterally, no wheezes, rales or rhonchi, good air movement  CV: HRRR, no peripheral edema  ABD: BS+, soft, NTTP  RECTUM: small skin tear at 12 O'clock  MS: moves all extremities without noticeable abnormality  PSYCH: pleasant and cooperative, no obvious depression or anxiety  ASSESSMENT AND PLAN:  Discussed the following assessment and plan:  Gastroenteritis  Gastroesophageal reflux disease, esophagitis presence not specified  -we discussed possible serious and likely etiologies, workup and treatment, treatment risks and return precautions - suspect chronic GERD with resolving gastroenteritis that is resolving, benign exam -advised nexium short course, align, supportive care, hc cream for tear  -of course, we advised Joyce Cortez  to return or notify a doctor immediately if symptoms worsen or persist or new concerns arise.  .  -Patient advised to return or notify a doctor immediately if symptoms worsen or persist or new concerns arise.  There are no Patient Instructions on file for this visit.   Colin Benton R.

## 2014-09-10 ENCOUNTER — Encounter: Payer: Self-pay | Admitting: Family Medicine

## 2014-09-10 ENCOUNTER — Ambulatory Visit (INDEPENDENT_AMBULATORY_CARE_PROVIDER_SITE_OTHER): Payer: BLUE CROSS/BLUE SHIELD | Admitting: Family Medicine

## 2014-09-10 VITALS — BP 100/64 | HR 86 | Temp 98.5°F | Ht 64.0 in | Wt 138.0 lb

## 2014-09-10 DIAGNOSIS — S61219A Laceration without foreign body of unspecified finger without damage to nail, initial encounter: Secondary | ICD-10-CM | POA: Diagnosis not present

## 2014-09-10 DIAGNOSIS — M797 Fibromyalgia: Secondary | ICD-10-CM | POA: Diagnosis not present

## 2014-09-10 MED ORDER — CYCLOBENZAPRINE HCL 10 MG PO TABS: 10.0000 mg | ORAL_TABLET | Freq: Three times a day (TID) | ORAL | Status: DC | PRN

## 2014-09-10 NOTE — Progress Notes (Signed)
Pre visit review using our clinic review tool, if applicable. No additional management support is needed unless otherwise documented below in the visit note. 

## 2014-09-10 NOTE — Progress Notes (Signed)
   Subjective:    Patient ID: Joyce Cortez, female    DOB: 17-Feb-1980, 34 y.o.   MRN: [id number]  HPI Her efor several things. First on 09-07-14 at home she was slicing tomatoes and cut the tip of her left 3rd finger. She has been applying hydrogen peroxide since then. Also she asks for a muscle relaxer to help with spasms in the neck and back that bother her sleep at times.    Review of Systems  Constitutional: Negative.   Respiratory: Negative.   Cardiovascular: Negative.   Musculoskeletal: Positive for back pain.  Skin: Positive for wound.       Objective:   Physical Exam  Constitutional: She appears well-developed and well-nourished. No distress.  Cardiovascular: Normal rate, regular rhythm, normal heart sounds and intact distal pulses.   Pulmonary/Chest: Effort normal and breath sounds normal.  Skin:  Small piece of skin is missing from the pad of the left 3rd finger tip. It looks clean and is partially healed.           Assessment & Plan:  For the finger laceration, I suggested she stop the peroxide and dress it with Neosporin daily. Given Flexeril for the muscle spasms.

## 2014-10-21 ENCOUNTER — Ambulatory Visit (INDEPENDENT_AMBULATORY_CARE_PROVIDER_SITE_OTHER): Payer: BLUE CROSS/BLUE SHIELD | Admitting: Family Medicine

## 2014-10-21 ENCOUNTER — Encounter: Payer: Self-pay | Admitting: Family Medicine

## 2014-10-21 VITALS — BP 118/82 | HR 75 | Temp 99.1°F | Ht 64.0 in | Wt 140.0 lb

## 2014-10-21 DIAGNOSIS — F411 Generalized anxiety disorder: Secondary | ICD-10-CM

## 2014-10-21 MED ORDER — LORAZEPAM 1 MG PO TABS: 1.0000 mg | ORAL_TABLET | Freq: Three times a day (TID) | ORAL | Status: DC | PRN

## 2014-10-21 NOTE — Progress Notes (Signed)
   Subjective:    Patient ID: Joyce Cortez, female    DOB: 03-31-1980, 34 y.o.   MRN: [id number]  HPI Here asking for help with anxiety. She has been under a lot of stress lately, as has her boyfriend of 5 months, due to the influence of her boyfriend's estarnaged wife. They are apparently separated and the former wife lives in another state now. The former wife has been hounding both of them with numerous threatening phone calls and emails. This has made Joyce Cortez very anxious and she asks if she can have a medication to use on an as needed basis.    Review of Systems  Constitutional: Negative.   Respiratory: Negative.   Cardiovascular: Negative.   Neurological: Negative.   Psychiatric/Behavioral: Negative for dysphoric mood, decreased concentration and agitation. The patient is nervous/anxious.        Objective:   Physical Exam  Constitutional: She is oriented to person, place, and time. She appears well-developed and well-nourished.  Cardiovascular: Normal rate, regular rhythm, normal heart sounds and intact distal pulses.   Pulmonary/Chest: Effort normal and breath sounds normal.  Neurological: She is alert and oriented to person, place, and time.  Psychiatric: Her behavior is normal. Thought content normal.  Anxious           Assessment & Plan:  Anxiety, try Ativan prn. Recheck prn

## 2014-10-21 NOTE — Progress Notes (Signed)
Pre visit review using our clinic review tool, if applicable. No additional management support is needed unless otherwise documented below in the visit note. 

## 2015-01-15 ENCOUNTER — Telehealth: Payer: Self-pay | Admitting: Family Medicine

## 2015-01-15 NOTE — Telephone Encounter (Signed)
Appt scheduled 12/29 to see Dr. Selena BattenKim.

## 2015-01-15 NOTE — Telephone Encounter (Signed)
PLEASE NOTE: All timestamps contained within this report are represented as [place]. CONFIDENTIALTY NOTICE: This fax transmission is intended only for the addressee. It contains information that is legally privileged, confidential or otherwise protected from use or disclosure. If you are not the intended recipient, you are strictly prohibited from reviewing, disclosing, copying using or disseminating any of this information or taking any action in reliance on or regarding this information. If you have received this fax in error, please notify us immediately by telephone so that we can arrange for its return to us. Phone: [phone], Toll-Free: [phone], Fax: [phone] Page: 1 of 1 Call Id: [id number] [facility] - Client TELEPHONE ADVICE RECORD [facility] Patient Name: Joyce HuxleySTEFANIE Guisinger DOB: 08/27/1980 Initial Comment Caller states she has nasal congestion, watery eyes, tired, achey Nurse Assessment Nurse: Elijah Birkaldwell, RN, Lynda Date/Time (Eastern Time): 01/15/2015 1:08:26 PM Confirm and document reason for call. If symptomatic, describe symptoms. ---Caller states she has nasal congestion, watery eyes, is feeling tired & achy. Has been sick in bed for 3 days now. Has been feeling bad since November & she has been irritable. Not sure if fever. Has the patient traveled out of the country within the last 30 days? ---No Does the patient have any new or worsening symptoms? ---Yes Will a triage be completed? ---Yes Related visit to physician within the last 2 weeks? ---No Does the PT have any chronic conditions? (i.e. diabetes, asthma, etc.) ---Yes List chronic conditions. ---fibromyalgia, allergies Did the patient indicate they were pregnant? ---No Is this a behavioral health or substance abuse call? ---No Guidelines Guideline Title Affirmed Question Affirmed Notes Common Cold [1] Nasal discharge AND [2] present >  10 days Final Disposition User See PCP When Office is Open (within 3 days) Elijah Birkaldwell, Charity fundraiserN, Hilton HotelsLynda Comments Caller says she is miserable, it is taking a toll on her mental health, making her very irritable. She is trying to feel better, trying for things not to get worse like they did last year when she was dx with pneumonia. Referrals REFERRED TO PCP OFFICE Disagree/Comply: Comply

## 2015-01-16 ENCOUNTER — Ambulatory Visit (INDEPENDENT_AMBULATORY_CARE_PROVIDER_SITE_OTHER): Payer: BLUE CROSS/BLUE SHIELD | Admitting: Family Medicine

## 2015-01-16 ENCOUNTER — Encounter: Payer: Self-pay | Admitting: Family Medicine

## 2015-01-16 VITALS — BP 110/88 | HR 98 | Temp 97.6°F | Ht 64.0 in | Wt 137.2 lb

## 2015-01-16 DIAGNOSIS — J0100 Acute maxillary sinusitis, unspecified: Secondary | ICD-10-CM

## 2015-01-16 DIAGNOSIS — N926 Irregular menstruation, unspecified: Secondary | ICD-10-CM

## 2015-01-16 DIAGNOSIS — Z3200 Encounter for pregnancy test, result unknown: Secondary | ICD-10-CM

## 2015-01-16 LAB — POCT URINE PREGNANCY: Preg Test, Ur: NEGATIVE

## 2015-01-16 MED ORDER — AMOXICILLIN-POT CLAVULANATE 875-125 MG PO TABS
1.0000 | ORAL_TABLET | Freq: Two times a day (BID) | ORAL | Status: DC
Start: 2015-01-16 — End: 2015-01-23

## 2015-01-16 NOTE — Progress Notes (Signed)
Pre visit review using our clinic review tool, if applicable. No additional management support is needed unless otherwise documented below in the visit note. 

## 2015-01-16 NOTE — Addendum Note (Signed)
Addended by: Johnella MoloneyFUNDERBURK, Tennille Montelongo A on: 01/16/2015 11:31 AM   Modules accepted: Orders

## 2015-01-16 NOTE — Patient Instructions (Addendum)
BEFORE YOU LEAVE: -urine test per your request -schedule follow up with Dr. Clent RidgesFry in 2-4 weeks  Take the antibiotic as instructed and follow up with your doctor if worsening or not improving as expected. -As we discussed, we have prescribed a new medication for you at this appointment. We discussed the common and serious potential adverse effects of this medication and you can review these and more with the pharmacist when you pick up your medication.  Please follow the instructions for use carefully and notify us immediately if you have any problems taking this medication.

## 2015-01-16 NOTE — Progress Notes (Signed)
HPI:  Sinus congestion -started: reports chronic sinus congestion with allergies, reports dismissed from allergy practice, worsening nasal congestion, thick nasal congestion and sinus pain and pressure with occ cough for the last 1 week -denies:fever, SOB, NVD, tooth pain -has tried: nothing -sick contacts/travel/risks: denies flu exposure, tick exposure or or Ebola risks -Hx of: allergies  Wants to check upreg test as period usually comes every 3 weeks and period late by a few days. Reports has IUD.  ROS: See pertinent positives and negatives per HPI.  Past Medical History  Diagnosis Date  . ADHD (attention deficit hyperactivity disorder)   . Allergy   . Asthma   . Fibromyalgia     Past Surgical History  Procedure Laterality Date  . Wisdom tooth extraction      Family History  Problem Relation Age of Onset  . Adopted: Yes    Social History   Social History  . Marital Status: Single    Spouse Name: N/A  . Number of Children: 0  . Years of Education: N/A   Occupational History  . Independent Contractor    Social History Main Topics  . Smoking status: Current Some Day Smoker  . Smokeless tobacco: Never Used  . Alcohol Use: 0.0 oz/week    0 Standard drinks or equivalent per week     Comment: 1-2 drinks a week  . Drug Use: Yes    Special: Marijuana     Comment: occ  . Sexual Activity: Not Asked   Other Topics Concern  . None   Social History Narrative     Current outpatient prescriptions:  .  esomeprazole (NEXIUM) 40 MG capsule, Take 1 capsule (40 mg total) by mouth daily., Disp: 90 capsule, Rfl: 3 .  fexofenadine (ALLEGRA) 180 MG tablet, Take 180 mg by mouth daily., Disp: , Rfl:  .  IUD's (PARAGARD INTRAUTERINE COPPER) IUD IUD, 1 each by Intrauterine route continuous. , Disp: , Rfl:  .  amoxicillin-clavulanate (AUGMENTIN) 875-125 MG tablet, Take 1 tablet by mouth 2 (two) times daily., Disp: 20 tablet, Rfl: 0  EXAM:  Filed Vitals:   01/16/15 1110   BP: 110/88  Pulse: 98  Temp: 97.6 F (36.4 C)    Body mass index is 23.54 kg/(m^2).  GENERAL: vitals reviewed and listed above, alert, oriented, appears well hydrated and in no acute distress  HEENT: atraumatic, conjunttiva clear, no obvious abnormalities on inspection of external nose and ears, normal appearance of ear canals and TMs, clear nasal congestion, mild post oropharyngeal erythema with PND, no tonsillar edema or exudate, no sinus TTP  NECK: no obvious masses on inspection  LUNGS: clear to auscultation bilaterally, no wheezes, rales or rhonchi, good air movement  CV: HRRR, no peripheral edema  MS: moves all extremities without noticeable abnormality  PSYCH: pleasant and cooperative, no obvious depression or anxiety  ASSESSMENT AND PLAN:  Discussed the following assessment and plan:  Acute maxillary sinusitis, recurrence not specified - Plan: amoxicillin-clavulanate (AUGMENTIN) 875-125 MG tablet  Irregular menstrual bleeding   We discussed potential etiologies, with sinusitis being most likely, and advised supportive care and monitoring. We discussed treatment side effects, likely course, antibiotic misuse, transmission, and signs of developing a serious illness. Upreg neg. . -of course, we advised to return or notify a doctor immediately if symptoms worsen or persist or new concerns arise.    Patient Instructions  BEFORE YOU LEAVE: -urine test per your request -schedule follow up with Dr. Clent RidgesFry in 2-4 weeks  Take the  antibiotic as instructed and follow up with your doctor if worsening or not improving as expected. -As we discussed, we have prescribed a new medication for you at this appointment. We discussed the common and serious potential adverse effects of this medication and you can review these and more with the pharmacist when you pick up your medication.  Please follow the instructions for use carefully and notify us immediately if you have any problems  taking this medication.      Kriste Basque R.

## 2015-01-17 ENCOUNTER — Telehealth: Payer: Self-pay | Admitting: Family Medicine

## 2015-01-17 NOTE — Telephone Encounter (Signed)
[facility] - Client TELEPHONE ADVICE RECORD [facility] Patient Name: Joyce HuxleySTEFANIE Cortez Gender: Female DOB: 01/13/1981 Age: [age] Y 3 M 29 D Return Phone Number: [phone] (Primary), [phone] (Secondary) Address: City/State/Zip: [place] Stat[place] [facility] - Client Client Site [facility] Day Physician Joyce Cortez Contact Type Call Call Type Triage / Clinical Relationship To Patient Self Appointment Disposition EMR Appointment Attempted - Not Scheduled Info pasted into Epic Yes Return Phone Number [phone] (Primary) Chief Complaint Fever (non urgent symptom) (> THREE MONTHS) Initial Comment caller states she has been on abx x2days, now has fever 99 and wants to know if its ok to take Nyquil with meds PreDisposition Call Doctor Nurse Assessment Nurse: Joyce Cortez Date/Time Joyce Cortez(Eastern Time): 01/17/2015 1:55:52 PM Confirm and document reason for call. If symptomatic, describe symptoms. ---CALLER STATES THAT SHE IS HAVING LOW GRADE FEVER. SHE HAS NOT BEEN ON THE ANTIBIOTICS FOR 48 HOURS YET. SHE IS WANTING TO TAKE SOME NYQUIL. Has the patient traveled out of the country within the last 30 days? ---Not Applicable Does the patient have any new or worsening symptoms? ---Yes Will a triage be completed? ---Yes Related visit to physician within the last 2 weeks? ---Yes Does the PT have any chronic conditions? (i.e. diabetes, asthma, etc.) ---No Did the patient indicate they were pregnant? ---No Is this a behavioral health or substance abuse call? ---No Guidelines Guideline Title Affirmed Question Affirmed Notes Nurse Date/Time Joyce Cortez(Eastern Time) Sinus Infection on Antibiotic Follow-up Call [1] Taking antibiotic AND [2] nose still blocked (all triage questions negative) [place], RN, Cortez 01/17/2015 1:57:06 PM Disp. Time Joyce Cortez(Eastern Time) Disposition Final User 01/17/2015 1:07:00 PM  Send To Clinical Follow Up Joyce BromeQueue Joyce Cortez 01/17/2015 1:27:58 PM Attempt made - message left Joyce Cortez PLEASE NOTE: All timestamps contained within this report are represented as [place] Standard Time. CONFIDENTIALTY NOTICE: This fax transmission is intended only for the addressee. It contains information that is legally privileged, confidential or otherwise protected from use or disclosure. If you are not the intended recipient, you are strictly prohibited from reviewing, disclosing, copying using or disseminating any of this information or taking any action in reliance on or regarding this information. If you have received this fax in error, please notify us immediately by telephone so that we can arrange for its return to us. Phone: [phone], Toll-Free: [phone], Fax: [phone] Page: 2 of 2 Call Id: [id number] 01/17/2015 1:58:16 PM Home Care Yes Joyce Cortez Caller Understands: Yes Disagree/Comply: Comply Care Advice Given Per Guideline HOME CARE: You should be able to treat this at home. FOR A STUFFY NOSE - USE NASAL WASHES: NASAL DECONGESTANTS FOR A VERY STUFFY NOSE: ANTIBIOTIC: Continue taking the prescribed antibiotic. CALL BACK IF: * Difficulty breathing (and not relieved by cleaning out nose) * Symptoms don't improve by day 4 on antibiotics * You become worse. CARE ADVICE given per Sinus Infection on Antibiotic Follow-Up Call (Adult) guideline. After Care Instructions Given Call Event Type User Date / Time Description Referrals REFERRED TO PCP OFFICE [facility]

## 2015-01-17 NOTE — Telephone Encounter (Signed)
Noted  

## 2015-01-23 ENCOUNTER — Ambulatory Visit (INDEPENDENT_AMBULATORY_CARE_PROVIDER_SITE_OTHER): Payer: BLUE CROSS/BLUE SHIELD | Admitting: Family Medicine

## 2015-01-23 ENCOUNTER — Encounter: Payer: Self-pay | Admitting: Family Medicine

## 2015-01-23 VITALS — BP 102/72 | HR 77 | Temp 98.3°F | Ht 64.0 in | Wt 136.0 lb

## 2015-01-23 DIAGNOSIS — J019 Acute sinusitis, unspecified: Secondary | ICD-10-CM | POA: Diagnosis not present

## 2015-01-23 MED ORDER — ALBUTEROL SULFATE HFA 108 (90 BASE) MCG/ACT IN AERS: 2.0000 | INHALATION_SPRAY | RESPIRATORY_TRACT | Status: DC | PRN

## 2015-01-23 MED ORDER — METHYLPREDNISOLONE ACETATE 80 MG/ML IJ SUSP
120.0000 mg | Freq: Once | INTRAMUSCULAR | Status: AC
  Administered 2015-01-23: 120 mg via INTRAMUSCULAR

## 2015-01-23 MED ORDER — LEVOFLOXACIN 500 MG PO TABS: 500.0000 mg | ORAL_TABLET | Freq: Every day | ORAL | Status: AC

## 2015-01-23 NOTE — Addendum Note (Signed)
Addended by: Aniceto BossNIMMONS, SYLVIA A on: 01/23/2015 01:42 PM   Modules accepted: Orders

## 2015-01-23 NOTE — Progress Notes (Signed)
   Subjective:    Patient ID: Joyce Cortez, female    DOB: 03/08/1980, 35 y.o.   MRN: [id number]  HPI Here to follow up on a sinusitis. She was here 2 weeks ago with sinus pressure, PND, and a dry cough. She was given Augmentin but this has not helped. She is using Mucinex.    Review of Systems  Constitutional: Negative.   HENT: Positive for congestion and postnasal drip.   Eyes: Negative.   Respiratory: Positive for cough and chest tightness.        Objective:   Physical Exam  HENT:  Right Ear: External ear normal.  Left Ear: External ear normal.  Nose: Nose normal.  Mouth/Throat: Oropharynx is clear and moist.  Eyes: Conjunctivae are normal.  Neck: No thyromegaly present.  Pulmonary/Chest: Effort normal and breath sounds normal.  Lymphadenopathy:    She has no cervical adenopathy.          Assessment & Plan:  Sinusitis. We will switch her to Levaquin. Given a steroid shot. Use the inhaler prn

## 2015-01-30 ENCOUNTER — Ambulatory Visit (INDEPENDENT_AMBULATORY_CARE_PROVIDER_SITE_OTHER): Payer: BLUE CROSS/BLUE SHIELD | Admitting: Family Medicine

## 2015-01-30 ENCOUNTER — Encounter: Payer: Self-pay | Admitting: Family Medicine

## 2015-01-30 VITALS — BP 101/62 | HR 84 | Temp 98.7°F | Ht 64.0 in | Wt 136.0 lb

## 2015-01-30 DIAGNOSIS — J019 Acute sinusitis, unspecified: Secondary | ICD-10-CM | POA: Diagnosis not present

## 2015-01-30 DIAGNOSIS — M797 Fibromyalgia: Secondary | ICD-10-CM | POA: Diagnosis not present

## 2015-01-30 MED ORDER — METHYLPREDNISOLONE ACETATE 80 MG/ML IJ SUSP
120.0000 mg | Freq: Once | INTRAMUSCULAR | Status: AC
  Administered 2015-01-30: 120 mg via INTRAMUSCULAR

## 2015-01-30 NOTE — Progress Notes (Signed)
Pre visit review using our clinic review tool, if applicable. No additional management support is needed unless otherwise documented below in the visit note. 

## 2015-01-30 NOTE — Progress Notes (Signed)
uin  Subjective:    Patient ID: Joyce Cortez, female    DOB: 05/04/1980, 35 y.o.   MRN: [id number]  HPI Here for advice on several issues. First she was here a week ago for a sinus infection and she was given a steroid shot and was started on 10 days of Levaquin. She felt much better after the shot but after a few days the sinus pressure and HA returned. She is finishing up the Levaquin but she asks if she can get another shot. Also she asks for advice about her fibromyalgia. She has had intermittent dull pains and stiffness of her muscles for several years but this has gotten worse. She tries to work full time but she finds that after working 3 or 4 hours she becomes very achy and fatigued. She ends up only working about 20 hours a week. She asks if she would be a candidate for partial disability status.    Review of Systems  Constitutional: Negative.   HENT: Positive for congestion, postnasal drip and sinus pressure.   Eyes: Negative.   Respiratory: Negative.   Musculoskeletal: Positive for myalgias.       Objective:   Physical Exam  Constitutional: She is oriented to person, place, and time. She appears well-developed and well-nourished.  HENT:  Right Ear: External ear normal.  Left Ear: External ear normal.  Nose: Nose normal.  Mouth/Throat: Oropharynx is clear and moist.  Eyes: Conjunctivae are normal.  Neck: No thyromegaly present.  Pulmonary/Chest: Effort normal and breath sounds normal.  Lymphadenopathy:    She has no cervical adenopathy.  Neurological: She is alert and oriented to person, place, and time.          Assessment & Plan:  Partially treated sinusitis. Finish out the Levaquin. Given another steroid shot. Refer to Rheumatology for the fibromyalgia.

## 2015-02-03 ENCOUNTER — Ambulatory Visit: Payer: BLUE CROSS/BLUE SHIELD | Admitting: Family Medicine

## 2015-03-21 ENCOUNTER — Encounter: Payer: Self-pay | Admitting: Family Medicine

## 2015-03-21 ENCOUNTER — Ambulatory Visit (INDEPENDENT_AMBULATORY_CARE_PROVIDER_SITE_OTHER): Payer: BLUE CROSS/BLUE SHIELD | Admitting: Family Medicine

## 2015-03-21 VITALS — BP 120/73 | HR 84 | Temp 98.8°F | Ht 64.0 in | Wt 130.0 lb

## 2015-03-21 DIAGNOSIS — S60511A Abrasion of right hand, initial encounter: Secondary | ICD-10-CM

## 2015-03-21 DIAGNOSIS — S40211A Abrasion of right shoulder, initial encounter: Secondary | ICD-10-CM

## 2015-03-21 DIAGNOSIS — Z23 Encounter for immunization: Secondary | ICD-10-CM | POA: Diagnosis not present

## 2015-03-21 DIAGNOSIS — S80219A Abrasion, unspecified knee, initial encounter: Secondary | ICD-10-CM

## 2015-03-21 DIAGNOSIS — L089 Local infection of the skin and subcutaneous tissue, unspecified: Secondary | ICD-10-CM

## 2015-03-21 NOTE — Progress Notes (Signed)
   Subjective:    Patient ID: Joyce Cortez, female    DOB: 07/27/1980, 35 y.o.   MRN: [id number]  HPI Here to check on wounds from a fall last night in her back yard. She tripped over the leash while walking her dog. She bruised several places as outlined below. She did not hit her head, no LOC etc.    Review of Systems  Constitutional: Negative.   Respiratory: Negative.   Cardiovascular: Negative.   Skin: Positive for wound.  Neurological: Negative.        Objective:   Physical Exam  Constitutional: She is oriented to person, place, and time. She appears well-developed and well-nourished. No distress.  Cardiovascular: Normal rate, regular rhythm, normal heart sounds and intact distal pulses.   Pulmonary/Chest: Effort normal and breath sounds normal.  Neurological: She is alert and oriented to person, place, and time.  Skin:  Superficial abrasions on the right hand, right shoulder, and both knees. These areas are tender but not swollen. All joints have full ROM          Assessment & Plan:  Multiple abrasions. She will dress these areas daily with Neosporin. Given a TDaP today. Recheck prn

## 2015-03-21 NOTE — Progress Notes (Signed)
Pre visit review using our clinic review tool, if applicable. No additional management support is needed unless otherwise documented below in the visit note. 

## 2015-03-24 ENCOUNTER — Encounter: Payer: Self-pay | Admitting: Family Medicine

## 2015-03-25 ENCOUNTER — Telehealth: Payer: Self-pay | Admitting: Family Medicine

## 2015-03-25 NOTE — Telephone Encounter (Signed)
FYI   pt has an appt with Dr Clent RidgesFry tomorrow 03/26/15

## 2015-03-25 NOTE — Telephone Encounter (Signed)
Patient Name: Joyce HuxleySTEFANIE Thissen DOB: 04/18/1980 Initial Comment Caller states, throat feels scratchy, painful, very tired, concerned about possible flu or strep , wants an appt asap Nurse Assessment Nurse: Elijah Birkaldwell, RN, Stark BrayLynda Date/Time (Eastern Time): 03/25/2015 12:53:36 PM Confirm and document reason for call. If symptomatic, describe symptoms. You must click the next button to save text entered. ---Caller states her throat feels scratchy, painful, very tired, concerned about possible flu or strep , wants an appt asap. When reached, caller states she made an appt. with Dr. Clent RidgesFry tomorrow afternoon. Has already spoken with someone. Has the patient traveled out of the country within the last 30 days? ---Not Applicable Does the patient have any new or worsening symptoms? ---Yes Will a triage be completed? ---No Select reason for no triage. ---Patient declined Please document clinical information provided and list any resource used. ---Nurse offered to review caller's symptoms, but she stated she already spoke with someone & has an appt. scheduled. Guidelines Guideline Title Affirmed Question Affirmed Notes Final Disposition User Clinical Call Chilialdwell, RN, Stark BrayLynda

## 2015-03-26 ENCOUNTER — Ambulatory Visit (INDEPENDENT_AMBULATORY_CARE_PROVIDER_SITE_OTHER): Payer: BLUE CROSS/BLUE SHIELD | Admitting: Family Medicine

## 2015-03-26 ENCOUNTER — Encounter: Payer: Self-pay | Admitting: Family Medicine

## 2015-03-26 VITALS — BP 109/89 | HR 87 | Temp 98.5°F | Ht 64.0 in | Wt 129.0 lb

## 2015-03-26 DIAGNOSIS — Z91048 Other nonmedicinal substance allergy status: Secondary | ICD-10-CM

## 2015-03-26 DIAGNOSIS — Z9109 Other allergy status, other than to drugs and biological substances: Secondary | ICD-10-CM

## 2015-03-26 NOTE — Progress Notes (Signed)
Pre visit review using our clinic review tool, if applicable. No additional management support is needed unless otherwise documented below in the visit note. 

## 2015-03-26 NOTE — Progress Notes (Signed)
   Subjective:    Patient ID: Joyce Cortez, female    DOB: 01/27/1980, 35 y.o.   MRN: [id number]  HPI Here for several days of PND and itchy throat. No HA or cough or fever. Taking Alegra.    Review of Systems  Constitutional: Negative.   HENT: Positive for postnasal drip and sneezing. Negative for congestion, ear pain, sinus pressure, sore throat and trouble swallowing.   Eyes: Negative.   Respiratory: Negative.        Objective:   Physical Exam  Constitutional: She appears well-developed and well-nourished.  HENT:  Right Ear: External ear normal.  Left Ear: External ear normal.  Nose: Nose normal.  Mouth/Throat: Oropharynx is clear and moist.  Eyes: Conjunctivae are normal.  Neck: No thyromegaly present.  Pulmonary/Chest: Effort normal and breath sounds normal.  Lymphadenopathy:    She has no cervical adenopathy.          Assessment & Plan:  Allergies. She will drink fluids and take Allegra. Recheck prn

## 2015-05-01 ENCOUNTER — Encounter: Payer: Self-pay | Admitting: Family Medicine

## 2015-05-01 ENCOUNTER — Ambulatory Visit (INDEPENDENT_AMBULATORY_CARE_PROVIDER_SITE_OTHER): Payer: BLUE CROSS/BLUE SHIELD | Admitting: Family Medicine

## 2015-05-01 VITALS — BP 110/68 | HR 66 | Temp 99.0°F | Resp 20 | Ht 64.0 in | Wt 132.0 lb

## 2015-05-01 DIAGNOSIS — R5383 Other fatigue: Secondary | ICD-10-CM | POA: Diagnosis not present

## 2015-05-01 DIAGNOSIS — J309 Allergic rhinitis, unspecified: Secondary | ICD-10-CM

## 2015-05-01 DIAGNOSIS — H6122 Impacted cerumen, left ear: Secondary | ICD-10-CM | POA: Diagnosis not present

## 2015-05-01 DIAGNOSIS — R1032 Left lower quadrant pain: Secondary | ICD-10-CM | POA: Diagnosis not present

## 2015-05-01 LAB — POC URINALSYSI DIPSTICK (AUTOMATED)
BILIRUBIN UA: NEGATIVE
Blood, UA: NEGATIVE
GLUCOSE UA: NEGATIVE
Ketones, UA: NEGATIVE
Leukocytes, UA: NEGATIVE
NITRITE UA: NEGATIVE
Protein, UA: NEGATIVE
Spec Grav, UA: >=1.03
Urobilinogen, UA: 0.2
pH, UA: 6

## 2015-05-01 LAB — POCT URINE PREGNANCY: Preg Test, Ur: NEGATIVE

## 2015-05-01 MED ORDER — FLUTICASONE PROPIONATE 50 MCG/ACT NA SUSP: 1.0000 | Freq: Two times a day (BID) | NASAL | Status: DC

## 2015-05-01 NOTE — Patient Instructions (Addendum)
   A few things to remember from today's visit:  Problem List Items Addressed This Visit      Respiratory   Allergic rhinitis  Flonase added. Continue steam showers.   Other Visit Diagnoses    LLQ abdominal pain    -  It does not seem worrisome, please arrange appt with your gyn for a pelvic examination (as you are planning to do so). If fever, worsening pain, nausea or vomiting please seem urgent medical attention.     Relevant Orders    POCT Urinalysis Dipstick (Automated) Normal    POCT urine pregnancy Negative    Cerumen impaction, left       Over the counter Debrox or mineral oil may help. NO Q tips.     It is a common symptom associated with multiple factors: psychologic,medications, systemic illness, sleep disorders,infections, and unknown causes. Some work-up can be done to evaluate for common causes as thyroid disease,anemia,diabetes, or abnormalities in calcium,potassium,or sodium. Regular physical activity and a healthy diet is usually recommended for chronic fatigue.

## 2015-05-01 NOTE — Progress Notes (Signed)
Subjective:    Patient ID: Saraya Tirey, female    DOB: 28-May-1980, 35 y.o.   MRN: [id number]  HPI  Ms.  Sabrina Keough is a 35 y.o.female here today complaining of 1-2 weeks of respiratory symptoms. Left ear discomfort, fullness sensation. + Rhinorrhea, nasal congestion, and post nasal drainage. Occasional cough, mainly at night.  No Hx of recent overseas travel. No sick contact. No known insect bite. + Hx of allergies,she stopped her Allegra for one to 2 months and resumed a couple weeks ago.  She also mentions that she went to [place] to visit some friends, who have dogs in the house. Hx of asthma, has not noted wheezing.   She has not tried OTC, except for Allegra. Symptoms otherwise stable.  - She also mentions abdominal pain, states that is not bad, plannig on seeing her gym.  She states that pain is dull, mild, no radiated, intermittent, she has not identified exacerbating or alleviating factors. She has had similar pain before, she is not sure if this is associated with her fibromyalgia. Pain is overall stable, not present today.LMP 04/17/15, she has IUD.  She denies any history of constipation (listed on her problem list) or diarrhea, but mentions that while she was in [place] visiting her friends she just had a bowel movement. At day after she came back (04/30/15) she had about 3-4 stools at home, normal consistency,she denies any blood in the stool. Her diet was very different for a few days while out of town.Attributes symptoms to changes in her normal diet. No nausea or vomiting.  She denies urinary symptoms except for occasional small urine stream and vaginal dryness sensation, which she compares with having intercourse with no lubrication. A week of whitish vaginal discharge, no pruritus and no abnormal vaginal bleeding. No dyspareunia.  + Fatigue, which seems to be worse than her usual. She states that could be related to depression but she doe snot feel  depressed. Hx of fibromyalgia. No fever or chills. No skin rash.   Review of Systems  Constitutional: Positive for fatigue. Negative for fever, chills, activity change, appetite change and unexpected weight change.  HENT: Positive for congestion, postnasal drip and rhinorrhea. Negative for ear discharge, mouth sores, nosebleeds, sore throat, trouble swallowing and voice change.   Eyes: Negative for pain, discharge, itching and visual disturbance.  Respiratory: Positive for cough. Negative for shortness of breath and wheezing.   Gastrointestinal: Positive for abdominal pain, diarrhea (resolved) and constipation. Negative for nausea, vomiting, blood in stool and abdominal distention.  Endocrine: Negative for cold intolerance and heat intolerance.  Genitourinary: Positive for vaginal discharge. Negative for dysuria, urgency, frequency, hematuria, flank pain, vaginal bleeding and menstrual problem.  Musculoskeletal: Negative for back pain, arthralgias and neck pain.  Skin: Negative for rash.  Allergic/Immunologic: Positive for environmental allergies. Negative for food allergies.  Neurological: Negative for weakness, numbness and headaches.  Hematological: Negative for adenopathy. Does not bruise/bleed easily.  Psychiatric/Behavioral: Negative for behavioral problems, confusion and sleep disturbance. The patient is not nervous/anxious.       Current Outpatient Prescriptions on File Prior to Visit  Medication Sig Dispense Refill  . fexofenadine (ALLEGRA) 180 MG tablet Take 180 mg by mouth daily.    . IUD's (PARAGARD INTRAUTERINE COPPER) IUD IUD 1 each by Intrauterine route continuous.     Marland Kitchen albuterol (PROVENTIL HFA;VENTOLIN HFA) 108 (90 Base) MCG/ACT inhaler Inhale 2 puffs into the lungs every 4 (four) hours as needed for wheezing or shortness of breath. (  Patient not taking: Reported on 03/21/2015) 1 Inhaler 2   No current facility-administered medications on file prior to visit.     Past  Medical History  Diagnosis Date  . ADHD (attention deficit hyperactivity disorder)   . Allergy   . Asthma   . Fibromyalgia     Social History   Social History  . Marital Status: Single    Spouse Name: N/A  . Number of Children: 0  . Years of Education: N/A   Occupational History  . Independent Contractor    Social History Main Topics  . Smoking status: Former Games developermoker  . Smokeless tobacco: Never Used  . Alcohol Use: No  . Drug Use: Yes    Special: Marijuana     Comment: occ  . Sexual Activity: Not Asked   Other Topics Concern  . None   Social History Narrative    Filed Vitals:   05/01/15 0916  BP: 110/68  Pulse: 66  Temp: 99 F (37.2 C)  Resp: 20   Body mass index is 22.65 kg/(m^2).      Objective:   Physical Exam  Constitutional: She is oriented to person, place, and time. She appears well-developed and well-nourished.  HENT:  Head: Atraumatic.  Right Ear: Tympanic membrane, external ear and ear canal normal.  Left Ear: External ear normal.  Nose: Rhinorrhea present. No mucosal edema. Right sinus exhibits no maxillary sinus tenderness and no frontal sinus tenderness. Left sinus exhibits no maxillary sinus tenderness and no frontal sinus tenderness.  Mouth/Throat: Uvula is midline, oropharynx is clear and moist and mucous membranes are normal. No oropharyngeal exudate, posterior oropharyngeal edema or posterior oropharyngeal erythema.  Left cerumen excess, I cannot see TM. Hypertrophic turbinates.  Eyes: Conjunctivae are normal.  Cardiovascular: Normal rate and regular rhythm.   No murmur heard. Pulmonary/Chest: Effort normal and breath sounds normal. No respiratory distress. She has no wheezes. She has no rales.  Abdominal: Soft. She exhibits no distension and no mass. There is no hepatosplenomegaly. There is no tenderness. There is no CVA tenderness.  Musculoskeletal: She exhibits no edema.  Lymphadenopathy:    She has no cervical adenopathy.    Neurological: She is alert and oriented to person, place, and time. Coordination and gait normal.  Skin: Skin is warm. No rash noted.  Psychiatric: She has a normal mood and affect. Her speech is normal.  Well groomed, good eye contact.        Assessment & Plan:    Linda HedgesStefanie was seen today for nasal congestion, sore throat, generalized body aches and fatigue.  Diagnoses and all orders for this visit:  LLQ abdominal pain ? IBS, ovulation among some possible causes. Today GI examination is negative otherwise, clearly instructed about warning signs. She is planning on scheduling appt with her gyn.  -     POCT Urinalysis Dipstick (Automated): Negative. -     POCT urine pregnancy: Negative.  Allergic rhinitis, unspecified allergic rhinitis type Symptoms reported today suggest allergic etiology. Flonase nasal spray added. Continue Allegra daily. Steam inhalations. F/U as needed.  -     fluticasone (FLONASE) 50 MCG/ACT nasal spray; Place 1 spray into both nostrils 2 (two) times daily.  Cerumen impaction, left OTC Debrox or mineral oil recommended. F/U as needed.  Other fatigue  Explained that this is a common symptoms for many illness, unspecific. Seems chronic, could have been exacerbated by allergies, recent travel, etc. For now I do not think lab work is needed. I recommended following with  pcp if needed. I am hoping that once allergic rhinitis symptoms improved and she is back to a healthy diet problem improves.   Betty G. [place], MD  [facility]. [facility].

## 2015-05-01 NOTE — Progress Notes (Signed)
Pre visit review using our clinic review tool, if applicable. No additional management support is needed unless otherwise documented below in the visit note. 

## 2015-05-07 DIAGNOSIS — S20211A Contusion of right front wall of thorax, initial encounter: Secondary | ICD-10-CM | POA: Diagnosis not present

## 2015-05-07 DIAGNOSIS — M25511 Pain in right shoulder: Secondary | ICD-10-CM | POA: Diagnosis not present

## 2015-05-07 DIAGNOSIS — M25531 Pain in right wrist: Secondary | ICD-10-CM | POA: Diagnosis not present

## 2015-05-16 DIAGNOSIS — M25511 Pain in right shoulder: Secondary | ICD-10-CM | POA: Diagnosis not present

## 2015-05-16 DIAGNOSIS — M797 Fibromyalgia: Secondary | ICD-10-CM | POA: Diagnosis not present

## 2015-05-16 DIAGNOSIS — R51 Headache: Secondary | ICD-10-CM | POA: Diagnosis not present

## 2015-05-16 DIAGNOSIS — M6281 Muscle weakness (generalized): Secondary | ICD-10-CM | POA: Diagnosis not present

## 2015-05-20 DIAGNOSIS — R51 Headache: Secondary | ICD-10-CM | POA: Diagnosis not present

## 2015-05-20 DIAGNOSIS — M797 Fibromyalgia: Secondary | ICD-10-CM | POA: Diagnosis not present

## 2015-05-20 DIAGNOSIS — M6281 Muscle weakness (generalized): Secondary | ICD-10-CM | POA: Diagnosis not present

## 2015-05-20 DIAGNOSIS — M25511 Pain in right shoulder: Secondary | ICD-10-CM | POA: Diagnosis not present

## 2015-05-21 DIAGNOSIS — M6281 Muscle weakness (generalized): Secondary | ICD-10-CM | POA: Diagnosis not present

## 2015-05-21 DIAGNOSIS — M25511 Pain in right shoulder: Secondary | ICD-10-CM | POA: Diagnosis not present

## 2015-05-21 DIAGNOSIS — M797 Fibromyalgia: Secondary | ICD-10-CM | POA: Diagnosis not present

## 2015-05-21 DIAGNOSIS — R51 Headache: Secondary | ICD-10-CM | POA: Diagnosis not present

## 2015-05-27 DIAGNOSIS — M25511 Pain in right shoulder: Secondary | ICD-10-CM | POA: Diagnosis not present

## 2015-05-27 DIAGNOSIS — M797 Fibromyalgia: Secondary | ICD-10-CM | POA: Diagnosis not present

## 2015-05-27 DIAGNOSIS — R51 Headache: Secondary | ICD-10-CM | POA: Diagnosis not present

## 2015-05-27 DIAGNOSIS — M6281 Muscle weakness (generalized): Secondary | ICD-10-CM | POA: Diagnosis not present

## 2015-06-02 DIAGNOSIS — M25511 Pain in right shoulder: Secondary | ICD-10-CM | POA: Diagnosis not present

## 2015-06-02 DIAGNOSIS — M797 Fibromyalgia: Secondary | ICD-10-CM | POA: Diagnosis not present

## 2015-06-02 DIAGNOSIS — R51 Headache: Secondary | ICD-10-CM | POA: Diagnosis not present

## 2015-06-02 DIAGNOSIS — M6281 Muscle weakness (generalized): Secondary | ICD-10-CM | POA: Diagnosis not present

## 2015-06-03 DIAGNOSIS — R51 Headache: Secondary | ICD-10-CM | POA: Diagnosis not present

## 2015-06-03 DIAGNOSIS — M6281 Muscle weakness (generalized): Secondary | ICD-10-CM | POA: Diagnosis not present

## 2015-06-03 DIAGNOSIS — M797 Fibromyalgia: Secondary | ICD-10-CM | POA: Diagnosis not present

## 2015-06-03 DIAGNOSIS — M25511 Pain in right shoulder: Secondary | ICD-10-CM | POA: Diagnosis not present

## 2015-06-05 DIAGNOSIS — M797 Fibromyalgia: Secondary | ICD-10-CM | POA: Diagnosis not present

## 2015-06-05 DIAGNOSIS — M25511 Pain in right shoulder: Secondary | ICD-10-CM | POA: Diagnosis not present

## 2015-06-05 DIAGNOSIS — R51 Headache: Secondary | ICD-10-CM | POA: Diagnosis not present

## 2015-06-05 DIAGNOSIS — M6281 Muscle weakness (generalized): Secondary | ICD-10-CM | POA: Diagnosis not present

## 2015-06-11 DIAGNOSIS — M25511 Pain in right shoulder: Secondary | ICD-10-CM | POA: Diagnosis not present

## 2015-06-11 DIAGNOSIS — R51 Headache: Secondary | ICD-10-CM | POA: Diagnosis not present

## 2015-06-12 DIAGNOSIS — M25511 Pain in right shoulder: Secondary | ICD-10-CM | POA: Diagnosis not present

## 2015-06-12 DIAGNOSIS — R51 Headache: Secondary | ICD-10-CM | POA: Diagnosis not present

## 2015-06-13 DIAGNOSIS — R51 Headache: Secondary | ICD-10-CM | POA: Diagnosis not present

## 2015-06-13 DIAGNOSIS — M25511 Pain in right shoulder: Secondary | ICD-10-CM | POA: Diagnosis not present

## 2015-06-17 DIAGNOSIS — R51 Headache: Secondary | ICD-10-CM | POA: Diagnosis not present

## 2015-06-17 DIAGNOSIS — M6281 Muscle weakness (generalized): Secondary | ICD-10-CM | POA: Diagnosis not present

## 2015-06-17 DIAGNOSIS — F4323 Adjustment disorder with mixed anxiety and depressed mood: Secondary | ICD-10-CM | POA: Diagnosis not present

## 2015-06-17 DIAGNOSIS — M25511 Pain in right shoulder: Secondary | ICD-10-CM | POA: Diagnosis not present

## 2015-06-19 DIAGNOSIS — M6281 Muscle weakness (generalized): Secondary | ICD-10-CM | POA: Diagnosis not present

## 2015-06-19 DIAGNOSIS — R51 Headache: Secondary | ICD-10-CM | POA: Diagnosis not present

## 2015-06-19 DIAGNOSIS — M25511 Pain in right shoulder: Secondary | ICD-10-CM | POA: Diagnosis not present

## 2015-06-23 DIAGNOSIS — R51 Headache: Secondary | ICD-10-CM | POA: Diagnosis not present

## 2015-06-23 DIAGNOSIS — F4323 Adjustment disorder with mixed anxiety and depressed mood: Secondary | ICD-10-CM | POA: Diagnosis not present

## 2015-06-23 DIAGNOSIS — M6281 Muscle weakness (generalized): Secondary | ICD-10-CM | POA: Diagnosis not present

## 2015-06-23 DIAGNOSIS — F4311 Post-traumatic stress disorder, acute: Secondary | ICD-10-CM | POA: Diagnosis not present

## 2015-06-23 DIAGNOSIS — F410 Panic disorder [episodic paroxysmal anxiety] without agoraphobia: Secondary | ICD-10-CM | POA: Diagnosis not present

## 2015-06-23 DIAGNOSIS — F900 Attention-deficit hyperactivity disorder, predominantly inattentive type: Secondary | ICD-10-CM | POA: Diagnosis not present

## 2015-06-23 DIAGNOSIS — M25511 Pain in right shoulder: Secondary | ICD-10-CM | POA: Diagnosis not present

## 2015-06-23 DIAGNOSIS — M797 Fibromyalgia: Secondary | ICD-10-CM | POA: Diagnosis not present

## 2015-06-26 DIAGNOSIS — M797 Fibromyalgia: Secondary | ICD-10-CM | POA: Diagnosis not present

## 2015-06-26 DIAGNOSIS — R51 Headache: Secondary | ICD-10-CM | POA: Diagnosis not present

## 2015-06-26 DIAGNOSIS — F4323 Adjustment disorder with mixed anxiety and depressed mood: Secondary | ICD-10-CM | POA: Diagnosis not present

## 2015-06-30 DIAGNOSIS — M797 Fibromyalgia: Secondary | ICD-10-CM | POA: Diagnosis not present

## 2015-06-30 DIAGNOSIS — F4323 Adjustment disorder with mixed anxiety and depressed mood: Secondary | ICD-10-CM | POA: Diagnosis not present

## 2015-06-30 DIAGNOSIS — R51 Headache: Secondary | ICD-10-CM | POA: Diagnosis not present

## 2015-07-03 DIAGNOSIS — M797 Fibromyalgia: Secondary | ICD-10-CM | POA: Diagnosis not present

## 2015-07-03 DIAGNOSIS — F4323 Adjustment disorder with mixed anxiety and depressed mood: Secondary | ICD-10-CM | POA: Diagnosis not present

## 2015-07-03 DIAGNOSIS — R51 Headache: Secondary | ICD-10-CM | POA: Diagnosis not present

## 2015-07-04 DIAGNOSIS — R51 Headache: Secondary | ICD-10-CM | POA: Diagnosis not present

## 2015-07-04 DIAGNOSIS — F4323 Adjustment disorder with mixed anxiety and depressed mood: Secondary | ICD-10-CM | POA: Diagnosis not present

## 2015-07-04 DIAGNOSIS — M797 Fibromyalgia: Secondary | ICD-10-CM | POA: Diagnosis not present

## 2015-07-07 DIAGNOSIS — M797 Fibromyalgia: Secondary | ICD-10-CM | POA: Diagnosis not present

## 2015-07-07 DIAGNOSIS — R51 Headache: Secondary | ICD-10-CM | POA: Diagnosis not present

## 2015-07-07 DIAGNOSIS — F4323 Adjustment disorder with mixed anxiety and depressed mood: Secondary | ICD-10-CM | POA: Diagnosis not present

## 2015-07-08 DIAGNOSIS — F4323 Adjustment disorder with mixed anxiety and depressed mood: Secondary | ICD-10-CM | POA: Diagnosis not present

## 2015-07-08 DIAGNOSIS — R51 Headache: Secondary | ICD-10-CM | POA: Diagnosis not present

## 2015-07-08 DIAGNOSIS — M797 Fibromyalgia: Secondary | ICD-10-CM | POA: Diagnosis not present

## 2015-07-10 DIAGNOSIS — M797 Fibromyalgia: Secondary | ICD-10-CM | POA: Diagnosis not present

## 2015-07-10 DIAGNOSIS — F4323 Adjustment disorder with mixed anxiety and depressed mood: Secondary | ICD-10-CM | POA: Diagnosis not present

## 2015-07-10 DIAGNOSIS — R51 Headache: Secondary | ICD-10-CM | POA: Diagnosis not present

## 2015-07-14 DIAGNOSIS — F4323 Adjustment disorder with mixed anxiety and depressed mood: Secondary | ICD-10-CM | POA: Diagnosis not present

## 2015-07-14 DIAGNOSIS — M797 Fibromyalgia: Secondary | ICD-10-CM | POA: Diagnosis not present

## 2015-07-14 DIAGNOSIS — R51 Headache: Secondary | ICD-10-CM | POA: Diagnosis not present

## 2015-07-15 DIAGNOSIS — F4323 Adjustment disorder with mixed anxiety and depressed mood: Secondary | ICD-10-CM | POA: Diagnosis not present

## 2015-07-15 DIAGNOSIS — M797 Fibromyalgia: Secondary | ICD-10-CM | POA: Diagnosis not present

## 2015-07-15 DIAGNOSIS — R51 Headache: Secondary | ICD-10-CM | POA: Diagnosis not present

## 2015-07-17 DIAGNOSIS — R51 Headache: Secondary | ICD-10-CM | POA: Diagnosis not present

## 2015-07-17 DIAGNOSIS — F4323 Adjustment disorder with mixed anxiety and depressed mood: Secondary | ICD-10-CM | POA: Diagnosis not present

## 2015-07-17 DIAGNOSIS — M797 Fibromyalgia: Secondary | ICD-10-CM | POA: Diagnosis not present

## 2015-07-18 DIAGNOSIS — M797 Fibromyalgia: Secondary | ICD-10-CM | POA: Diagnosis not present

## 2015-07-18 DIAGNOSIS — F4323 Adjustment disorder with mixed anxiety and depressed mood: Secondary | ICD-10-CM | POA: Diagnosis not present

## 2015-07-18 DIAGNOSIS — R51 Headache: Secondary | ICD-10-CM | POA: Diagnosis not present

## 2015-07-24 DIAGNOSIS — W57XXXA Bitten or stung by nonvenomous insect and other nonvenomous arthropods, initial encounter: Secondary | ICD-10-CM | POA: Diagnosis not present

## 2015-07-24 DIAGNOSIS — D239 Other benign neoplasm of skin, unspecified: Secondary | ICD-10-CM | POA: Diagnosis not present

## 2015-08-01 DIAGNOSIS — F4323 Adjustment disorder with mixed anxiety and depressed mood: Secondary | ICD-10-CM | POA: Diagnosis not present

## 2015-08-01 DIAGNOSIS — M797 Fibromyalgia: Secondary | ICD-10-CM | POA: Diagnosis not present

## 2015-08-01 DIAGNOSIS — R51 Headache: Secondary | ICD-10-CM | POA: Diagnosis not present

## 2015-08-04 DIAGNOSIS — F4323 Adjustment disorder with mixed anxiety and depressed mood: Secondary | ICD-10-CM | POA: Diagnosis not present

## 2015-08-04 DIAGNOSIS — R51 Headache: Secondary | ICD-10-CM | POA: Diagnosis not present

## 2015-08-04 DIAGNOSIS — M797 Fibromyalgia: Secondary | ICD-10-CM | POA: Diagnosis not present

## 2015-08-05 DIAGNOSIS — M797 Fibromyalgia: Secondary | ICD-10-CM | POA: Diagnosis not present

## 2015-08-05 DIAGNOSIS — R51 Headache: Secondary | ICD-10-CM | POA: Diagnosis not present

## 2015-08-05 DIAGNOSIS — F4323 Adjustment disorder with mixed anxiety and depressed mood: Secondary | ICD-10-CM | POA: Diagnosis not present

## 2015-08-12 DIAGNOSIS — M797 Fibromyalgia: Secondary | ICD-10-CM | POA: Diagnosis not present

## 2015-08-12 DIAGNOSIS — F4323 Adjustment disorder with mixed anxiety and depressed mood: Secondary | ICD-10-CM | POA: Diagnosis not present

## 2015-08-12 DIAGNOSIS — R51 Headache: Secondary | ICD-10-CM | POA: Diagnosis not present

## 2015-08-14 DIAGNOSIS — F4323 Adjustment disorder with mixed anxiety and depressed mood: Secondary | ICD-10-CM | POA: Diagnosis not present

## 2015-08-14 DIAGNOSIS — M797 Fibromyalgia: Secondary | ICD-10-CM | POA: Diagnosis not present

## 2015-08-14 DIAGNOSIS — R51 Headache: Secondary | ICD-10-CM | POA: Diagnosis not present

## 2015-08-19 DIAGNOSIS — F4323 Adjustment disorder with mixed anxiety and depressed mood: Secondary | ICD-10-CM | POA: Diagnosis not present

## 2015-08-20 DIAGNOSIS — M797 Fibromyalgia: Secondary | ICD-10-CM | POA: Diagnosis not present

## 2015-08-20 DIAGNOSIS — R51 Headache: Secondary | ICD-10-CM | POA: Diagnosis not present

## 2015-08-20 DIAGNOSIS — F4323 Adjustment disorder with mixed anxiety and depressed mood: Secondary | ICD-10-CM | POA: Diagnosis not present

## 2015-08-25 DIAGNOSIS — R51 Headache: Secondary | ICD-10-CM | POA: Diagnosis not present

## 2015-08-25 DIAGNOSIS — M797 Fibromyalgia: Secondary | ICD-10-CM | POA: Diagnosis not present

## 2015-08-25 DIAGNOSIS — F4323 Adjustment disorder with mixed anxiety and depressed mood: Secondary | ICD-10-CM | POA: Diagnosis not present

## 2015-08-26 DIAGNOSIS — R51 Headache: Secondary | ICD-10-CM | POA: Diagnosis not present

## 2015-08-26 DIAGNOSIS — M797 Fibromyalgia: Secondary | ICD-10-CM | POA: Diagnosis not present

## 2015-08-26 DIAGNOSIS — F4323 Adjustment disorder with mixed anxiety and depressed mood: Secondary | ICD-10-CM | POA: Diagnosis not present

## 2015-08-27 DIAGNOSIS — F4323 Adjustment disorder with mixed anxiety and depressed mood: Secondary | ICD-10-CM | POA: Diagnosis not present

## 2015-08-27 DIAGNOSIS — M797 Fibromyalgia: Secondary | ICD-10-CM | POA: Diagnosis not present

## 2015-08-27 DIAGNOSIS — R51 Headache: Secondary | ICD-10-CM | POA: Diagnosis not present

## 2015-09-01 DIAGNOSIS — F4323 Adjustment disorder with mixed anxiety and depressed mood: Secondary | ICD-10-CM | POA: Diagnosis not present

## 2015-09-02 DIAGNOSIS — F4323 Adjustment disorder with mixed anxiety and depressed mood: Secondary | ICD-10-CM | POA: Diagnosis not present

## 2015-09-02 DIAGNOSIS — R51 Headache: Secondary | ICD-10-CM | POA: Diagnosis not present

## 2015-09-02 DIAGNOSIS — M797 Fibromyalgia: Secondary | ICD-10-CM | POA: Diagnosis not present

## 2015-09-03 DIAGNOSIS — F4323 Adjustment disorder with mixed anxiety and depressed mood: Secondary | ICD-10-CM | POA: Diagnosis not present

## 2015-09-03 DIAGNOSIS — R319 Hematuria, unspecified: Secondary | ICD-10-CM | POA: Diagnosis not present

## 2015-09-03 DIAGNOSIS — R51 Headache: Secondary | ICD-10-CM | POA: Diagnosis not present

## 2015-09-03 DIAGNOSIS — N760 Acute vaginitis: Secondary | ICD-10-CM | POA: Diagnosis not present

## 2015-09-03 DIAGNOSIS — M797 Fibromyalgia: Secondary | ICD-10-CM | POA: Diagnosis not present

## 2015-09-05 DIAGNOSIS — M797 Fibromyalgia: Secondary | ICD-10-CM | POA: Diagnosis not present

## 2015-09-05 DIAGNOSIS — F4323 Adjustment disorder with mixed anxiety and depressed mood: Secondary | ICD-10-CM | POA: Diagnosis not present

## 2015-09-05 DIAGNOSIS — R51 Headache: Secondary | ICD-10-CM | POA: Diagnosis not present

## 2015-09-08 DIAGNOSIS — F4323 Adjustment disorder with mixed anxiety and depressed mood: Secondary | ICD-10-CM | POA: Diagnosis not present

## 2015-09-08 DIAGNOSIS — M797 Fibromyalgia: Secondary | ICD-10-CM | POA: Diagnosis not present

## 2015-09-08 DIAGNOSIS — R51 Headache: Secondary | ICD-10-CM | POA: Diagnosis not present

## 2015-09-12 ENCOUNTER — Other Ambulatory Visit: Payer: Self-pay | Admitting: Family Medicine

## 2015-09-12 DIAGNOSIS — J309 Allergic rhinitis, unspecified: Secondary | ICD-10-CM

## 2015-09-12 MED ORDER — FLUTICASONE PROPIONATE 50 MCG/ACT NA SUSP: 1.0000 | Freq: Two times a day (BID) | NASAL | 3 refills | Status: DC

## 2015-09-12 NOTE — Telephone Encounter (Signed)
Pt can do a 90 day supply now on her fluticasone (FLONASE) 50 MCG/ACT nasal spray  And would [facility] [address]

## 2015-09-15 DIAGNOSIS — F4323 Adjustment disorder with mixed anxiety and depressed mood: Secondary | ICD-10-CM | POA: Diagnosis not present

## 2015-09-16 ENCOUNTER — Ambulatory Visit: Payer: BLUE CROSS/BLUE SHIELD | Admitting: Family Medicine

## 2015-09-16 DIAGNOSIS — F4323 Adjustment disorder with mixed anxiety and depressed mood: Secondary | ICD-10-CM | POA: Diagnosis not present

## 2015-09-16 DIAGNOSIS — R51 Headache: Secondary | ICD-10-CM | POA: Diagnosis not present

## 2015-09-16 DIAGNOSIS — M797 Fibromyalgia: Secondary | ICD-10-CM | POA: Diagnosis not present

## 2015-09-19 DIAGNOSIS — M797 Fibromyalgia: Secondary | ICD-10-CM | POA: Diagnosis not present

## 2015-09-19 DIAGNOSIS — F4323 Adjustment disorder with mixed anxiety and depressed mood: Secondary | ICD-10-CM | POA: Diagnosis not present

## 2015-09-19 DIAGNOSIS — R51 Headache: Secondary | ICD-10-CM | POA: Diagnosis not present

## 2015-09-24 DIAGNOSIS — M797 Fibromyalgia: Secondary | ICD-10-CM | POA: Diagnosis not present

## 2015-10-27 DIAGNOSIS — F4323 Adjustment disorder with mixed anxiety and depressed mood: Secondary | ICD-10-CM | POA: Diagnosis not present

## 2015-10-31 DIAGNOSIS — F4323 Adjustment disorder with mixed anxiety and depressed mood: Secondary | ICD-10-CM | POA: Diagnosis not present

## 2015-10-31 DIAGNOSIS — R51 Headache: Secondary | ICD-10-CM | POA: Diagnosis not present

## 2015-10-31 DIAGNOSIS — M797 Fibromyalgia: Secondary | ICD-10-CM | POA: Diagnosis not present

## 2015-11-13 DIAGNOSIS — F4323 Adjustment disorder with mixed anxiety and depressed mood: Secondary | ICD-10-CM | POA: Diagnosis not present

## 2015-11-13 DIAGNOSIS — M797 Fibromyalgia: Secondary | ICD-10-CM | POA: Diagnosis not present

## 2015-11-13 DIAGNOSIS — R51 Headache: Secondary | ICD-10-CM | POA: Diagnosis not present

## 2015-11-17 DIAGNOSIS — F4323 Adjustment disorder with mixed anxiety and depressed mood: Secondary | ICD-10-CM | POA: Diagnosis not present

## 2015-12-02 DIAGNOSIS — M797 Fibromyalgia: Secondary | ICD-10-CM | POA: Diagnosis not present

## 2015-12-02 DIAGNOSIS — R51 Headache: Secondary | ICD-10-CM | POA: Diagnosis not present

## 2015-12-02 DIAGNOSIS — F4323 Adjustment disorder with mixed anxiety and depressed mood: Secondary | ICD-10-CM | POA: Diagnosis not present

## 2015-12-03 DIAGNOSIS — F4323 Adjustment disorder with mixed anxiety and depressed mood: Secondary | ICD-10-CM | POA: Diagnosis not present

## 2015-12-09 DIAGNOSIS — R51 Headache: Secondary | ICD-10-CM | POA: Diagnosis not present

## 2015-12-09 DIAGNOSIS — F4323 Adjustment disorder with mixed anxiety and depressed mood: Secondary | ICD-10-CM | POA: Diagnosis not present

## 2015-12-09 DIAGNOSIS — M797 Fibromyalgia: Secondary | ICD-10-CM | POA: Diagnosis not present

## 2015-12-12 ENCOUNTER — Other Ambulatory Visit: Payer: Self-pay | Admitting: Family Medicine

## 2015-12-12 DIAGNOSIS — J309 Allergic rhinitis, unspecified: Secondary | ICD-10-CM

## 2015-12-22 DIAGNOSIS — F4323 Adjustment disorder with mixed anxiety and depressed mood: Secondary | ICD-10-CM | POA: Diagnosis not present

## 2015-12-23 DIAGNOSIS — M797 Fibromyalgia: Secondary | ICD-10-CM | POA: Diagnosis not present

## 2015-12-23 DIAGNOSIS — F4323 Adjustment disorder with mixed anxiety and depressed mood: Secondary | ICD-10-CM | POA: Diagnosis not present

## 2015-12-23 DIAGNOSIS — R51 Headache: Secondary | ICD-10-CM | POA: Diagnosis not present

## 2016-01-06 DIAGNOSIS — F4323 Adjustment disorder with mixed anxiety and depressed mood: Secondary | ICD-10-CM | POA: Diagnosis not present

## 2016-01-06 DIAGNOSIS — M797 Fibromyalgia: Secondary | ICD-10-CM | POA: Diagnosis not present

## 2016-01-06 DIAGNOSIS — R51 Headache: Secondary | ICD-10-CM | POA: Diagnosis not present

## 2016-01-20 DIAGNOSIS — R51 Headache: Secondary | ICD-10-CM | POA: Diagnosis not present

## 2016-01-20 DIAGNOSIS — F4323 Adjustment disorder with mixed anxiety and depressed mood: Secondary | ICD-10-CM | POA: Diagnosis not present

## 2016-01-20 DIAGNOSIS — M797 Fibromyalgia: Secondary | ICD-10-CM | POA: Diagnosis not present

## 2016-01-28 ENCOUNTER — Encounter: Payer: Self-pay | Admitting: Adult Health

## 2016-01-28 ENCOUNTER — Ambulatory Visit (INDEPENDENT_AMBULATORY_CARE_PROVIDER_SITE_OTHER): Payer: BLUE CROSS/BLUE SHIELD | Admitting: Adult Health

## 2016-01-28 VITALS — BP 118/66 | Temp 98.1°F | Ht 64.0 in | Wt 147.6 lb

## 2016-01-28 DIAGNOSIS — J0141 Acute recurrent pansinusitis: Secondary | ICD-10-CM | POA: Diagnosis not present

## 2016-01-28 MED ORDER — DOXYCYCLINE HYCLATE 100 MG PO CAPS: 100.0000 mg | ORAL_CAPSULE | Freq: Two times a day (BID) | ORAL | 0 refills | Status: DC

## 2016-01-28 NOTE — Progress Notes (Signed)
Subjective:    Patient ID: Joyce HuxleyStefanie Meinecke, female    DOB: 01/15/1981, 36 y.o.   MRN: [id number]  URI   This is a recurrent problem. Episode onset: unknown  There has been no fever. Associated symptoms include congestion, coughing, headaches, rhinorrhea and sinus pain. Pertinent negatives include no diarrhea, ear pain, nausea, sore throat or wheezing. Treatments tried: Mucinex. The treatment provided mild relief.    Review of Systems  Constitutional: Positive for activity change, fatigue and fever (subjective ). Negative for chills.  HENT: Positive for congestion, postnasal drip, rhinorrhea, sinus pain and sinus pressure. Negative for ear pain and sore throat.   Respiratory: Positive for cough and chest tightness. Negative for wheezing.   Cardiovascular: Negative.   Gastrointestinal: Negative for diarrhea and nausea.  Neurological: Positive for headaches.   Past Medical History:  Diagnosis Date  . ADHD (attention deficit hyperactivity disorder)   . Allergy   . Asthma   . Fibromyalgia     Social History   Social History  . Marital status: Single    Spouse name: N/A  . Number of children: 0  . Years of education: N/A   Occupational History  . Independent Contractor    Social History Main Topics  . Smoking status: Former Games developermoker  . Smokeless tobacco: Never Used  . Alcohol use No  . Drug use:     Types: Marijuana     Comment: occ  . Sexual activity: Not on file   Other Topics Concern  . Not on file   Social History Narrative  . No narrative on file    Past Surgical History:  Procedure Laterality Date  . WISDOM TOOTH EXTRACTION      Family History  Problem Relation Age of Onset  . Adopted: Yes    Allergies  Allergen Reactions  . Codeine Nausea Only    Current Outpatient Prescriptions on File Prior to Visit  Medication Sig Dispense Refill  . albuterol (PROVENTIL HFA;VENTOLIN HFA) 108 (90 Base) MCG/ACT inhaler Inhale 2 puffs into the lungs every 4 (four)  hours as needed for wheezing or shortness of breath. 1 Inhaler 2  . fexofenadine (ALLEGRA) 180 MG tablet Take 180 mg by mouth daily.    . fluticasone (FLONASE) 50 MCG/ACT nasal spray USE 1 SPRAY IN EACH NOSTRIL TWICE A DAY 16 g 2  . IUD's (PARAGARD INTRAUTERINE COPPER) IUD IUD 1 each by Intrauterine route continuous.      No current facility-administered medications on file prior to visit.     BP 118/66   Temp 98.1 F (36.7 C) (Oral)   Ht 5\' 4"  (1.626 m)   Wt 147 lb 9.6 oz (67 kg)   BMI 25.34 kg/m       Objective:   Physical Exam  Constitutional: She is oriented to person, place, and time. She appears well-developed and well-nourished. No distress.  HENT:  Head: Normocephalic and atraumatic.  Right Ear: Hearing, tympanic membrane, external ear and ear canal normal. Tympanic membrane is not erythematous and not bulging.  Left Ear: Hearing, tympanic membrane, external ear and ear canal normal. Tympanic membrane is not erythematous and not bulging.  Nose: Mucosal edema and rhinorrhea present. Right sinus exhibits maxillary sinus tenderness and frontal sinus tenderness. Left sinus exhibits maxillary sinus tenderness and frontal sinus tenderness.  Mouth/Throat: Uvula is midline. Oropharyngeal exudate present. No posterior oropharyngeal edema, posterior oropharyngeal erythema or tonsillar abscesses.  Eyes: Conjunctivae and EOM are normal. Pupils are equal, round, and reactive to light.  Right eye exhibits no discharge. Left eye exhibits no discharge. No scleral icterus.  Neck: Normal range of motion. Neck supple. No thyromegaly present.  Cardiovascular: Normal rate, regular rhythm, normal heart sounds and intact distal pulses.  Exam reveals no friction rub.   No murmur heard. Pulmonary/Chest: Effort normal and breath sounds normal. No respiratory distress. She has no wheezes. She has no rales. She exhibits no tenderness.  Lymphadenopathy:    She has no cervical adenopathy.  Neurological:  She is alert and oriented to person, place, and time.  Skin: Skin is warm and dry. No rash noted. She is not diaphoretic. No erythema. No pallor.  Psychiatric: She has a normal mood and affect. Her behavior is normal. Judgment and thought content normal.  Nursing note and vitals reviewed.     Assessment & Plan:  1. Acute recurrent pansinusitis - doxycycline (VIBRAMYCIN) 100 MG capsule; Take 1 capsule (100 mg total) by mouth 2 (two) times daily.  Dispense: 14 capsule; Refill: 0 - Flonase - Rest and hydration  - Follow up if no improvement in the next 2-3 days   Shirline Frees, NP

## 2016-01-29 ENCOUNTER — Other Ambulatory Visit: Payer: Self-pay

## 2016-01-29 ENCOUNTER — Telehealth: Payer: Self-pay | Admitting: Family Medicine

## 2016-01-29 MED ORDER — ONDANSETRON HCL 4 MG PO TABS: 4.0000 mg | ORAL_TABLET | Freq: Three times a day (TID) | ORAL | 0 refills | Status: DC | PRN

## 2016-01-29 MED ORDER — AZITHROMYCIN 250 MG PO TABS: ORAL_TABLET | ORAL | 0 refills | Status: DC

## 2016-01-29 NOTE — Telephone Encounter (Signed)
Please advise 

## 2016-01-29 NOTE — Telephone Encounter (Signed)
° ° ° °  [facility] the following med is making her nausea and is asking if something else can be called in    doxycycline (VIBRAMYCIN) 100 MG capsule

## 2016-01-29 NOTE — Telephone Encounter (Signed)
Verbal order from [facility] to send in Zpak and Zofran.  Zpak and Zofran have been sent in.

## 2016-01-29 NOTE — Telephone Encounter (Signed)
4 mg Q8H PRN 20 pills, 1 refill

## 2016-01-29 NOTE — Telephone Encounter (Signed)
Pt state that she has been taking doxycycline since yesterday and it has made her feel worse then the virus and would like to have a Zpak. Pt state that she has been throwing up and has a lot of discomfort.   [facility] [address]

## 2016-01-29 NOTE — Telephone Encounter (Signed)
Patient states she would be ok with taking Zofran.  What doseage would you like me to send in?

## 2016-01-29 NOTE — Telephone Encounter (Signed)
Any antibiotic she takes has the potential to make her nauseated. I am happy to change the antibiotic. If the nausea isn't too bad, I would recommend taking Zofran for nausea about 30 minutes before her current antibiotic . I can call this in as well.

## 2016-02-03 ENCOUNTER — Ambulatory Visit (INDEPENDENT_AMBULATORY_CARE_PROVIDER_SITE_OTHER): Payer: BLUE CROSS/BLUE SHIELD | Admitting: Family Medicine

## 2016-02-03 ENCOUNTER — Encounter: Payer: Self-pay | Admitting: Family Medicine

## 2016-02-03 VITALS — BP 112/88 | HR 75 | Temp 98.7°F | Ht 64.0 in | Wt 150.0 lb

## 2016-02-03 DIAGNOSIS — J209 Acute bronchitis, unspecified: Secondary | ICD-10-CM | POA: Diagnosis not present

## 2016-02-03 MED ORDER — LEVOFLOXACIN 500 MG PO TABS: 500.0000 mg | ORAL_TABLET | Freq: Every day | ORAL | 0 refills | Status: AC

## 2016-02-03 NOTE — Progress Notes (Signed)
   Subjective:    Patient ID: Lois HuxleyStefanie Soulier, female    DOB: 07/21/1980, 36 y.o.   MRN: [id number]  HPI Here for recurrent chest congestion and coughing up yellow sputum. No fever. She was seen here on 01-28-16 and was given Doxycycline. She had to stop this after just 2 days because of nausea and vomiting. She was then given a Zpack, and this helped her feel better for about a week. Now for the past 3 days the chest congestion and cough are back.   Review of Systems  Constitutional: Negative.   HENT: Negative.   Eyes: Negative.   Respiratory: Positive for cough and chest tightness. Negative for shortness of breath and wheezing.   Cardiovascular: Negative.        Objective:   Physical Exam  Constitutional: She appears well-developed and well-nourished.  HENT:  Right Ear: External ear normal.  Left Ear: External ear normal.  Nose: Nose normal.  Mouth/Throat: Oropharynx is clear and moist.  Eyes: Conjunctivae are normal.  Neck: No thyromegaly present.  Pulmonary/Chest: Effort normal. No respiratory distress. She has no wheezes. She has no rales.  Scattered rhonchi   Lymphadenopathy:    She has no cervical adenopathy.          Assessment & Plan:  Partially treated bronchitis, treat with Levaquin. Recheck prn. Gershon CraneStephen Fry, MD

## 2016-02-03 NOTE — Progress Notes (Signed)
Pre visit review using our clinic review tool, if applicable. No additional management support is needed unless otherwise documented below in the visit note. 

## 2016-02-16 DIAGNOSIS — F4323 Adjustment disorder with mixed anxiety and depressed mood: Secondary | ICD-10-CM | POA: Diagnosis not present

## 2016-02-17 DIAGNOSIS — R51 Headache: Secondary | ICD-10-CM | POA: Diagnosis not present

## 2016-02-17 DIAGNOSIS — M797 Fibromyalgia: Secondary | ICD-10-CM | POA: Diagnosis not present

## 2016-02-17 DIAGNOSIS — F4323 Adjustment disorder with mixed anxiety and depressed mood: Secondary | ICD-10-CM | POA: Diagnosis not present

## 2016-02-23 ENCOUNTER — Encounter: Payer: Self-pay | Admitting: Family Medicine

## 2016-02-23 ENCOUNTER — Ambulatory Visit (INDEPENDENT_AMBULATORY_CARE_PROVIDER_SITE_OTHER): Payer: BLUE CROSS/BLUE SHIELD | Admitting: Family Medicine

## 2016-02-23 VITALS — BP 112/82 | HR 82 | Temp 98.6°F | Ht 64.0 in | Wt 150.0 lb

## 2016-02-23 DIAGNOSIS — R531 Weakness: Secondary | ICD-10-CM

## 2016-02-23 LAB — CBC WITH DIFFERENTIAL/PLATELET
Basophils Absolute: 0 10*3/uL (ref 0.0–0.1)
Basophils Relative: 0.6 % (ref 0.0–3.0)
Eosinophils Absolute: 0 10*3/uL (ref 0.0–0.7)
Eosinophils Relative: 0.7 % (ref 0.0–5.0)
HCT: 43 % (ref 36.0–46.0)
Hemoglobin: 14.5 g/dL (ref 12.0–15.0)
Lymphocytes Relative: 33.7 % (ref 12.0–46.0)
Lymphs Abs: 1.8 10*3/uL (ref 0.7–4.0)
MCHC: 33.8 g/dL (ref 30.0–36.0)
MCV: 95.6 fl (ref 78.0–100.0)
MONO ABS: 0.3 10*3/uL (ref 0.1–1.0)
Monocytes Relative: 5.1 % (ref 3.0–12.0)
NEUTROS ABS: 3.2 10*3/uL (ref 1.4–7.7)
NEUTROS PCT: 59.9 % (ref 43.0–77.0)
Platelets: 195 10*3/uL (ref 150.0–400.0)
RBC: 4.49 Mil/uL (ref 3.87–5.11)
RDW: 13 % (ref 11.5–15.5)
WBC: 5.3 10*3/uL (ref 4.0–10.5)

## 2016-02-23 LAB — BASIC METABOLIC PANEL
BUN: 20 mg/dL (ref 6–23)
CHLORIDE: 102 meq/L (ref 96–112)
CO2: 30 meq/L (ref 19–32)
Calcium: 9.7 mg/dL (ref 8.4–10.5)
Creatinine, Ser: 0.73 mg/dL (ref 0.40–1.20)
GFR: 97.33 mL/min (ref >60.00)
Glucose, Bld: 93 mg/dL (ref 70–99)
Potassium: 4.1 mEq/L (ref 3.5–5.1)
Sodium: 137 mEq/L (ref 135–145)

## 2016-02-23 LAB — HEPATIC FUNCTION PANEL
ALK PHOS: 52 U/L (ref 39–117)
ALT: 10 U/L (ref 0–35)
AST: 14 U/L (ref 0–37)
Albumin: 4.9 g/dL (ref 3.5–5.2)
Bilirubin, Direct: 0.1 mg/dL (ref 0.0–0.3)
Total Bilirubin: 0.6 mg/dL (ref 0.2–1.2)
Total Protein: 7.2 g/dL (ref 6.0–8.3)

## 2016-02-23 LAB — VITAMIN B12: VITAMIN B 12: 407 pg/mL (ref 211–911)

## 2016-02-23 LAB — TSH: TSH: 1.04 u[IU]/mL (ref 0.35–4.50)

## 2016-02-23 NOTE — Progress Notes (Signed)
Pre visit review using our clinic review tool, if applicable. No additional management support is needed unless otherwise documented below in the visit note. 

## 2016-02-23 NOTE — Progress Notes (Signed)
   Subjective:    Patient ID: Joyce Cortez, female    DOB: 07/13/1980, 36 y.o.   MRN: [id number]  HPI Here to follow up a recent respiratory illness. We last saw her on 02-03-16 after she had taken courses of Doxycycline and a Zpack for chest congestion and coughing up green sputum. We gave her Levaquin that day, which she finished up about a week ago. In general she feels much better now. She still has a lingering cough but the congestion is gone and the cough is dry. No fever. Her main concern today is lingering fatigue. She feels very tired and weak all over, although this has also improved.    Review of Systems  Constitutional: Positive for fatigue. Negative for chills, diaphoresis and fever.  HENT: Negative.   Eyes: Negative.   Respiratory: Positive for cough. Negative for shortness of breath and wheezing.   Cardiovascular: Negative.   Gastrointestinal: Negative.   Neurological: Negative.        Objective:   Physical Exam  Constitutional: She is oriented to person, place, and time. She appears well-developed and well-nourished. No distress.  HENT:  Right Ear: External ear normal.  Left Ear: External ear normal.  Nose: Nose normal.  Mouth/Throat: Oropharynx is clear and moist.  Eyes: Conjunctivae are normal.  Neck: No thyromegaly present.  Cardiovascular: Normal rate, regular rhythm, normal heart sounds and intact distal pulses.   Pulmonary/Chest: Effort normal and breath sounds normal. No respiratory distress. She has no wheezes. She has no rales.  Lymphadenopathy:    She has no cervical adenopathy.  Neurological: She is alert and oriented to person, place, and time.          Assessment & Plan:  The bronchitis has resolved. I think she still has some fatigue from the infection, and I reassured her that this may take some weeks to get over. We will get some labs today to rule out anemia, etc.  Gershon CraneStephen Fry, MD

## 2016-03-09 DIAGNOSIS — M797 Fibromyalgia: Secondary | ICD-10-CM | POA: Diagnosis not present

## 2016-03-09 DIAGNOSIS — F4323 Adjustment disorder with mixed anxiety and depressed mood: Secondary | ICD-10-CM | POA: Diagnosis not present

## 2016-03-09 DIAGNOSIS — R51 Headache: Secondary | ICD-10-CM | POA: Diagnosis not present

## 2016-03-12 DIAGNOSIS — M797 Fibromyalgia: Secondary | ICD-10-CM | POA: Diagnosis not present

## 2016-03-15 DIAGNOSIS — M797 Fibromyalgia: Secondary | ICD-10-CM | POA: Diagnosis not present

## 2016-03-15 DIAGNOSIS — R51 Headache: Secondary | ICD-10-CM | POA: Diagnosis not present

## 2016-03-15 DIAGNOSIS — F4323 Adjustment disorder with mixed anxiety and depressed mood: Secondary | ICD-10-CM | POA: Diagnosis not present

## 2016-03-17 ENCOUNTER — Encounter: Payer: Self-pay | Admitting: Family Medicine

## 2016-03-17 ENCOUNTER — Ambulatory Visit (INDEPENDENT_AMBULATORY_CARE_PROVIDER_SITE_OTHER): Payer: BLUE CROSS/BLUE SHIELD | Admitting: Family Medicine

## 2016-03-17 VITALS — BP 107/82 | HR 99 | Temp 98.8°F | Ht 64.0 in | Wt 149.0 lb

## 2016-03-17 DIAGNOSIS — L309 Dermatitis, unspecified: Secondary | ICD-10-CM | POA: Diagnosis not present

## 2016-03-17 DIAGNOSIS — F419 Anxiety disorder, unspecified: Secondary | ICD-10-CM

## 2016-03-17 MED ORDER — LORAZEPAM 0.5 MG PO TABS: 0.5000 mg | ORAL_TABLET | Freq: Three times a day (TID) | ORAL | 2 refills | Status: DC | PRN

## 2016-03-17 MED ORDER — HALOBETASOL PROPIONATE 0.05 % EX CREA: TOPICAL_CREAM | Freq: Two times a day (BID) | CUTANEOUS | 2 refills | Status: DC

## 2016-03-17 NOTE — Patient Instructions (Signed)
**  Coming March 12th**  [facility]!!!  Same Day Appointments for Acute Care: Sprains, Injuries, cuts, abrasions Colds, flu, sore throats, cough, upset stomachs Fever, ear pain Sinus and eye infections Animal/insect bites  3 Easy Ways to Schedule: Walk-In Scheduling Call in scheduling Mychart Sign-up: [email or web address]         

## 2016-03-17 NOTE — Progress Notes (Signed)
Pre visit review using our clinic review tool, if applicable. No additional management support is needed unless otherwise documented below in the visit note. 

## 2016-03-17 NOTE — Progress Notes (Signed)
   Subjective:    Patient ID: Joyce HuxleyStefanie Zerbe, female    DOB: 08/12/1980, 36 y.o.   MRN: [id number]  HPI Here for 2 things. First she has been under a lot of stress with some family issues and she would like to try some Ativan again. This was helpful for her about 2 years ago. Also for one week she has had itchy rashes on the legs, hands, and trunk. Benadryl does not help. No fever or ST.    Review of Systems  Constitutional: Negative.   Respiratory: Negative.   Cardiovascular: Negative.   Skin: Positive for rash.  Psychiatric/Behavioral: Negative for agitation, confusion, decreased concentration, dysphoric mood, hallucinations and sleep disturbance. The patient is nervous/anxious.        Objective:   Physical Exam  Constitutional: She is oriented to person, place, and time. She appears well-developed and well-nourished.  Cardiovascular: Normal rate, regular rhythm, normal heart sounds and intact distal pulses.   Pulmonary/Chest: Effort normal and breath sounds normal.  Neurological: She is alert and oriented to person, place, and time.  Skin:  Scattered patches of macular or papular erythema as above, some scaly areas   Psychiatric: Her behavior is normal. Thought content normal.  Anxious           Assessment & Plan:  She has some eczema, and no doubt her recent increase in stress has added to this. Treat the eczema with Halobetasol cream prn. Treat the anxiety with Ativan 0.5 mg prn. Gershon CraneStephen Helma Argyle, MD

## 2016-03-22 DIAGNOSIS — F4323 Adjustment disorder with mixed anxiety and depressed mood: Secondary | ICD-10-CM | POA: Diagnosis not present

## 2016-03-30 ENCOUNTER — Ambulatory Visit (INDEPENDENT_AMBULATORY_CARE_PROVIDER_SITE_OTHER): Payer: BLUE CROSS/BLUE SHIELD | Admitting: Family Medicine

## 2016-03-30 VITALS — BP 122/90 | HR 93 | Temp 98.2°F | Ht 64.0 in | Wt 154.0 lb

## 2016-03-30 DIAGNOSIS — F4323 Adjustment disorder with mixed anxiety and depressed mood: Secondary | ICD-10-CM | POA: Diagnosis not present

## 2016-03-30 DIAGNOSIS — B86 Scabies: Secondary | ICD-10-CM

## 2016-03-30 DIAGNOSIS — R51 Headache: Secondary | ICD-10-CM | POA: Diagnosis not present

## 2016-03-30 DIAGNOSIS — M797 Fibromyalgia: Secondary | ICD-10-CM | POA: Diagnosis not present

## 2016-03-30 MED ORDER — PERMETHRIN 5 % EX CREA: 1.0000 "application " | TOPICAL_CREAM | Freq: Once | CUTANEOUS | 0 refills | Status: AC

## 2016-03-30 NOTE — Patient Instructions (Signed)
WE NOW OFFER   [facility]ST TR[facility]!!!  SAME DAY Appointments for ACUTE CARE  Such as: Sprains, Injuries, cuts, abrasions, rashes, muscle pain, joint pain, back pain Colds, flu, sore throats, headache, allergies, cough, fever  Ear pain, sinus and eye infections Abdominal pain, nausea, vomiting, diarrhea, upset stomach Animal/insect bites  3 Easy Ways to Schedule: Walk-In Scheduling Call in scheduling Mychart Sign-up: [email or web address]         

## 2016-03-30 NOTE — Progress Notes (Signed)
Pre visit review using our clinic review tool, if applicable. No additional management support is needed unless otherwise documented below in the visit note. 

## 2016-03-31 ENCOUNTER — Encounter: Payer: Self-pay | Admitting: Family Medicine

## 2016-03-31 NOTE — Progress Notes (Signed)
   Subjective:    Patient ID: Joyce HuxleyStefanie Cortez, female    DOB: 06/16/1980, 36 y.o.   MRN: [id number]  HPI Here for widespread itchy red bumps over the body that appeared a few days ago. She notes that she [place] the [place]. Using Benadryl with mixed results.    Review of Systems  Constitutional: Negative.   Respiratory: Negative.   Cardiovascular: Negative.   Skin: Positive for rash.       Objective:   Physical Exam  Constitutional: She appears well-developed and well-nourished. No distress.  Cardiovascular: Normal rate, regular rhythm, normal heart sounds and intact distal pulses.   Pulmonary/Chest: Effort normal and breath sounds normal.  Skin:  Widespread red slightly raised nodules over the trunk and extremities that appear to be insect bites           Assessment & Plan:  Scabies. Wash all bed sheets and clothing in hot water. Cover the body with 5% Permethrin overnight and shower it off in the morning.  Gershon CraneStephen Gustavus Haskin, MD

## 2016-04-07 ENCOUNTER — Other Ambulatory Visit: Payer: Self-pay | Admitting: Family Medicine

## 2016-04-07 DIAGNOSIS — J309 Allergic rhinitis, unspecified: Secondary | ICD-10-CM

## 2016-04-09 DIAGNOSIS — F4323 Adjustment disorder with mixed anxiety and depressed mood: Secondary | ICD-10-CM | POA: Diagnosis not present

## 2016-04-09 DIAGNOSIS — M797 Fibromyalgia: Secondary | ICD-10-CM | POA: Diagnosis not present

## 2016-04-09 DIAGNOSIS — R51 Headache: Secondary | ICD-10-CM | POA: Diagnosis not present

## 2016-04-13 DIAGNOSIS — R51 Headache: Secondary | ICD-10-CM | POA: Diagnosis not present

## 2016-04-13 DIAGNOSIS — F4323 Adjustment disorder with mixed anxiety and depressed mood: Secondary | ICD-10-CM | POA: Diagnosis not present

## 2016-04-13 DIAGNOSIS — M797 Fibromyalgia: Secondary | ICD-10-CM | POA: Diagnosis not present

## 2016-04-20 DIAGNOSIS — R51 Headache: Secondary | ICD-10-CM | POA: Diagnosis not present

## 2016-04-20 DIAGNOSIS — M797 Fibromyalgia: Secondary | ICD-10-CM | POA: Diagnosis not present

## 2016-04-20 DIAGNOSIS — F4323 Adjustment disorder with mixed anxiety and depressed mood: Secondary | ICD-10-CM | POA: Diagnosis not present

## 2016-04-27 DIAGNOSIS — R51 Headache: Secondary | ICD-10-CM | POA: Diagnosis not present

## 2016-04-27 DIAGNOSIS — F4323 Adjustment disorder with mixed anxiety and depressed mood: Secondary | ICD-10-CM | POA: Diagnosis not present

## 2016-04-27 DIAGNOSIS — M797 Fibromyalgia: Secondary | ICD-10-CM | POA: Diagnosis not present

## 2016-05-04 DIAGNOSIS — M797 Fibromyalgia: Secondary | ICD-10-CM | POA: Diagnosis not present

## 2016-05-04 DIAGNOSIS — R51 Headache: Secondary | ICD-10-CM | POA: Diagnosis not present

## 2016-05-04 DIAGNOSIS — F4323 Adjustment disorder with mixed anxiety and depressed mood: Secondary | ICD-10-CM | POA: Diagnosis not present

## 2016-05-11 DIAGNOSIS — R51 Headache: Secondary | ICD-10-CM | POA: Diagnosis not present

## 2016-05-11 DIAGNOSIS — F4323 Adjustment disorder with mixed anxiety and depressed mood: Secondary | ICD-10-CM | POA: Diagnosis not present

## 2016-05-11 DIAGNOSIS — M797 Fibromyalgia: Secondary | ICD-10-CM | POA: Diagnosis not present

## 2016-05-18 DIAGNOSIS — M797 Fibromyalgia: Secondary | ICD-10-CM | POA: Diagnosis not present

## 2016-05-18 DIAGNOSIS — F4323 Adjustment disorder with mixed anxiety and depressed mood: Secondary | ICD-10-CM | POA: Diagnosis not present

## 2016-05-18 DIAGNOSIS — R51 Headache: Secondary | ICD-10-CM | POA: Diagnosis not present

## 2016-05-19 ENCOUNTER — Ambulatory Visit: Payer: BLUE CROSS/BLUE SHIELD | Admitting: Family Medicine

## 2016-05-19 ENCOUNTER — Ambulatory Visit (INDEPENDENT_AMBULATORY_CARE_PROVIDER_SITE_OTHER): Payer: BLUE CROSS/BLUE SHIELD | Admitting: Family Medicine

## 2016-05-19 ENCOUNTER — Encounter: Payer: Self-pay | Admitting: Family Medicine

## 2016-05-19 ENCOUNTER — Encounter: Payer: Self-pay | Admitting: Neurology

## 2016-05-19 VITALS — BP 113/87 | HR 71 | Temp 98.9°F

## 2016-05-19 DIAGNOSIS — R29898 Other symptoms and signs involving the musculoskeletal system: Secondary | ICD-10-CM

## 2016-05-19 NOTE — Patient Instructions (Signed)
WE NOW OFFER   [facility] FAST TRACK!!!  SAME DAY Appointments for ACUTE CARE  Such as: Sprains, Injuries, cuts, abrasions, rashes, muscle pain, joint pain, back pain Colds, flu, sore throats, headache, allergies, cough, fever  Ear pain, sinus and eye infections Abdominal pain, nausea, vomiting, diarrhea, upset stomach Animal/insect bites  3 Easy Ways to Schedule: Walk-In Scheduling Call in scheduling Mychart Sign-up: [email or web address]         

## 2016-05-19 NOTE — Progress Notes (Signed)
Pre visit review using our clinic review tool, if applicable. No additional management support is needed unless otherwise documented below in the visit note. Pt unable to stand and weigh.   

## 2016-05-19 NOTE — Progress Notes (Signed)
   Subjective:    Patient ID: Joyce Cortez, female    DOB: [birth year], 36 y.o.   MRN: [id number]  HPI Here with her boyfriend for weakness and pain in both lower extremities. She asks if we can arrange for her to get a wheelchair to use. She was diagnosed with fibromyalgia several years ago when she described intermittent pains all over the body. However over the past 2 weeks she has had more pain in the legs and especially the bottoms of her feet, and this makes walking on them quite painful. She also describes more weakness in the legs to where she cannot stand at times without her legs buckling beneath her. There is no numbness. The upper body has no such weakness. She admits to having a very busy schedule lately and has been working 15 hour days.    Review of Systems  Constitutional: Positive for fatigue. Negative for chills, diaphoresis and fever.  Respiratory: Negative.   Cardiovascular: Negative.   Musculoskeletal: Positive for myalgias.  Neurological: Positive for weakness. Negative for dizziness, tremors, seizures, syncope, facial asymmetry, speech difficulty, light-headedness, numbness and headaches.       Objective:   Physical Exam  Constitutional: She is oriented to person, place, and time.  Alert, in a wheelchair  Cardiovascular: Normal rate, regular rhythm, normal heart sounds and intact distal pulses.   Pulmonary/Chest: Effort normal and breath sounds normal.  Neurological: She is alert and oriented to person, place, and time. No cranial nerve deficit.  She cannot stand on her own. With assistance she can stand and take a few steps, but her legs tend to buckle and give way          Assessment & Plan:  Lower extremity weakness and pain. This is not typical for simple fibromyalgia. We will write for her to get a wheelchair to use. Refer to Neurology for further evaluation.  Gershon Crane, MD

## 2016-05-20 DIAGNOSIS — F4323 Adjustment disorder with mixed anxiety and depressed mood: Secondary | ICD-10-CM | POA: Diagnosis not present

## 2016-05-24 DIAGNOSIS — F4323 Adjustment disorder with mixed anxiety and depressed mood: Secondary | ICD-10-CM | POA: Diagnosis not present

## 2016-05-25 DIAGNOSIS — M797 Fibromyalgia: Secondary | ICD-10-CM | POA: Diagnosis not present

## 2016-05-25 DIAGNOSIS — F4323 Adjustment disorder with mixed anxiety and depressed mood: Secondary | ICD-10-CM | POA: Diagnosis not present

## 2016-05-25 DIAGNOSIS — R51 Headache: Secondary | ICD-10-CM | POA: Diagnosis not present

## 2016-05-26 ENCOUNTER — Telehealth: Payer: Self-pay | Admitting: Family Medicine

## 2016-05-26 DIAGNOSIS — F4323 Adjustment disorder with mixed anxiety and depressed mood: Secondary | ICD-10-CM | POA: Diagnosis not present

## 2016-05-26 DIAGNOSIS — M797 Fibromyalgia: Secondary | ICD-10-CM | POA: Diagnosis not present

## 2016-05-26 DIAGNOSIS — R51 Headache: Secondary | ICD-10-CM | POA: Diagnosis not present

## 2016-05-26 NOTE — Telephone Encounter (Signed)
Pt has an appt in July to see neurology

## 2016-05-26 NOTE — Telephone Encounter (Signed)
Per Dr. Clent RidgesFry this should come from neurology.

## 2016-05-26 NOTE — Telephone Encounter (Signed)
Pt was seen on 05-19-16 for weakness of lower extremities and would like md to give her handicap placard

## 2016-05-27 NOTE — Telephone Encounter (Signed)
Form is ready for pick up here at front office and I left a voice message with this information.

## 2016-05-27 NOTE — Telephone Encounter (Signed)
I will approve a temporary handicapped permit for 3 months until she can see Neurology

## 2016-05-31 ENCOUNTER — Telehealth: Payer: Self-pay | Admitting: Family Medicine

## 2016-05-31 NOTE — Telephone Encounter (Signed)
Pt has found a neurologist that can see her sooner, Dr Ruthann [facility]Ferarou [facility]  [address], [place], [place]  [phone] Ok to refer?  Pt states they have an appt first week in June, so pt states it is time sensitive in order to get this appt.

## 2016-05-31 NOTE — Telephone Encounter (Signed)
See my new referral to Neurology please

## 2016-05-31 NOTE — Addendum Note (Signed)
Addended by: Gershon CraneFRY, Tobe Kervin A on: 05/31/2016 01:10 PM   Modules accepted: Orders

## 2016-06-01 ENCOUNTER — Encounter: Payer: Self-pay | Admitting: Family Medicine

## 2016-06-01 ENCOUNTER — Ambulatory Visit (INDEPENDENT_AMBULATORY_CARE_PROVIDER_SITE_OTHER): Payer: BLUE CROSS/BLUE SHIELD | Admitting: Family Medicine

## 2016-06-01 VITALS — BP 107/91 | HR 79 | Temp 98.5°F

## 2016-06-01 DIAGNOSIS — J019 Acute sinusitis, unspecified: Secondary | ICD-10-CM

## 2016-06-01 MED ORDER — LEVOFLOXACIN 500 MG PO TABS: 500.0000 mg | ORAL_TABLET | Freq: Every day | ORAL | 0 refills | Status: DC

## 2016-06-01 NOTE — Patient Instructions (Signed)
WE NOW OFFER   [facility] FAST TRACK!!!  SAME DAY Appointments for ACUTE CARE  Such as: Sprains, Injuries, cuts, abrasions, rashes, muscle pain, joint pain, back pain Colds, flu, sore throats, headache, allergies, cough, fever  Ear pain, sinus and eye infections Abdominal pain, nausea, vomiting, diarrhea, upset stomach Animal/insect bites  3 Easy Ways to Schedule: Walk-In Scheduling Call in scheduling Mychart Sign-up: [email or web address]         

## 2016-06-01 NOTE — Progress Notes (Signed)
   Subjective:    Patient ID: Joyce Cortez, female    DOB: 09/20/1980, 36 y.o.   MRN: [id number]  HPI Here for 3 days of low grade fevers, sinus pressure, PND, and ST. No cough.    Review of Systems  Constitutional: Positive for fever.  HENT: Positive for congestion, postnasal drip, sinus pain, sinus pressure and sore throat.   Eyes: Negative.   Respiratory: Negative.        Objective:   Physical Exam  Constitutional: She appears well-developed and well-nourished.  HENT:  Right Ear: External ear normal.  Left Ear: External ear normal.  Nose: Nose normal.  Mouth/Throat: Oropharynx is clear and moist.  Eyes: Conjunctivae are normal.  Neck: No thyromegaly present.  Pulmonary/Chest: Effort normal and breath sounds normal. No respiratory distress. She has no rales.  Lymphadenopathy:    She has no cervical adenopathy.          Assessment & Plan:  Sinusitis, treat with Levaquin and Mucinex.  Gershon CraneStephen Mcarthur Ivins, MD

## 2016-06-04 ENCOUNTER — Telehealth: Payer: Self-pay

## 2016-06-04 MED ORDER — AMOXICILLIN-POT CLAVULANATE 875-125 MG PO TABS: 1.0000 | ORAL_TABLET | Freq: Two times a day (BID) | ORAL | 0 refills | Status: DC

## 2016-06-04 NOTE — Telephone Encounter (Signed)
The foot pain is probably from the Levaquin. Stop this and switch to Augmentin 875. Take this bid and call in #20

## 2016-06-04 NOTE — Telephone Encounter (Signed)
Spoke with pt's boyfriend and advised. He will have pt call us if symptoms do not improve. Nothing further needed at this time.

## 2016-06-04 NOTE — Telephone Encounter (Signed)
Pt's boyfriend called to report pt is c/o increased foot pain since being on Levaquin. She is reporting "pins and needles" type feeling in her feet and wants to know if this could be a side effect of the medication. She does have some improvement in her sinus congestion but is still coughing.   Dr. Clent RidgesFry - Please advise. Thanks!

## 2016-06-15 DIAGNOSIS — F4323 Adjustment disorder with mixed anxiety and depressed mood: Secondary | ICD-10-CM | POA: Diagnosis not present

## 2016-06-15 DIAGNOSIS — M797 Fibromyalgia: Secondary | ICD-10-CM | POA: Diagnosis not present

## 2016-06-15 DIAGNOSIS — R51 Headache: Secondary | ICD-10-CM | POA: Diagnosis not present

## 2016-06-24 DIAGNOSIS — R200 Anesthesia of skin: Secondary | ICD-10-CM | POA: Diagnosis not present

## 2016-06-24 DIAGNOSIS — R202 Paresthesia of skin: Secondary | ICD-10-CM | POA: Diagnosis not present

## 2016-06-24 DIAGNOSIS — M79604 Pain in right leg: Secondary | ICD-10-CM | POA: Diagnosis not present

## 2016-06-24 DIAGNOSIS — R55 Syncope and collapse: Secondary | ICD-10-CM | POA: Diagnosis not present

## 2016-06-24 DIAGNOSIS — R29898 Other symptoms and signs involving the musculoskeletal system: Secondary | ICD-10-CM | POA: Diagnosis not present

## 2016-06-28 ENCOUNTER — Telehealth: Payer: Self-pay | Admitting: Family Medicine

## 2016-06-28 DIAGNOSIS — R569 Unspecified convulsions: Secondary | ICD-10-CM

## 2016-06-28 NOTE — Telephone Encounter (Signed)
Pt would like a referral to neurologist who specialize in fibromyalgia and seizures. Pt states dr patel does not specialized in those areas

## 2016-06-28 NOTE — Telephone Encounter (Signed)
Pt agree's to see [facility].

## 2016-06-28 NOTE — Telephone Encounter (Signed)
Referral was done  

## 2016-06-28 NOTE — Telephone Encounter (Signed)
I do not know of any neurologist that specializes in fibromyalgia, but we can find one for seizures. I would suggest referral to a [facility] neurologist if she wishes

## 2016-06-29 DIAGNOSIS — F4323 Adjustment disorder with mixed anxiety and depressed mood: Secondary | ICD-10-CM | POA: Diagnosis not present

## 2016-06-29 NOTE — Telephone Encounter (Signed)
I spoke with pt and went over below information. 

## 2016-07-02 ENCOUNTER — Emergency Department (HOSPITAL_COMMUNITY): Payer: BLUE CROSS/BLUE SHIELD

## 2016-07-02 ENCOUNTER — Encounter (HOSPITAL_COMMUNITY): Payer: Self-pay | Admitting: Emergency Medicine

## 2016-07-02 ENCOUNTER — Emergency Department (HOSPITAL_COMMUNITY)
Admission: EM | Admit: 2016-07-02 | Discharge: 2016-07-02 | Disposition: A | Discharge status: Home / Self Care | Payer: BLUE CROSS/BLUE SHIELD | Attending: Emergency Medicine | Admitting: Emergency Medicine

## 2016-07-02 DIAGNOSIS — Z791 Long term (current) use of non-steroidal anti-inflammatories (NSAID): Secondary | ICD-10-CM | POA: Insufficient documentation

## 2016-07-02 DIAGNOSIS — Z793 Long term (current) use of hormonal contraceptives: Secondary | ICD-10-CM | POA: Insufficient documentation

## 2016-07-02 DIAGNOSIS — R29898 Other symptoms and signs involving the musculoskeletal system: Secondary | ICD-10-CM

## 2016-07-02 DIAGNOSIS — R51 Headache: Secondary | ICD-10-CM | POA: Diagnosis not present

## 2016-07-02 DIAGNOSIS — F809 Developmental disorder of speech and language, unspecified: Secondary | ICD-10-CM | POA: Diagnosis not present

## 2016-07-02 DIAGNOSIS — J45909 Unspecified asthma, uncomplicated: Secondary | ICD-10-CM | POA: Diagnosis not present

## 2016-07-02 DIAGNOSIS — M79671 Pain in right foot: Secondary | ICD-10-CM | POA: Diagnosis not present

## 2016-07-02 DIAGNOSIS — M6281 Muscle weakness (generalized): Secondary | ICD-10-CM | POA: Diagnosis not present

## 2016-07-02 DIAGNOSIS — Z87891 Personal history of nicotine dependence: Secondary | ICD-10-CM | POA: Diagnosis not present

## 2016-07-02 DIAGNOSIS — Z79899 Other long term (current) drug therapy: Secondary | ICD-10-CM | POA: Diagnosis not present

## 2016-07-02 DIAGNOSIS — M797 Fibromyalgia: Secondary | ICD-10-CM | POA: Diagnosis not present

## 2016-07-02 DIAGNOSIS — M542 Cervicalgia: Secondary | ICD-10-CM | POA: Diagnosis not present

## 2016-07-02 DIAGNOSIS — M79672 Pain in left foot: Secondary | ICD-10-CM | POA: Diagnosis not present

## 2016-07-02 DIAGNOSIS — F4323 Adjustment disorder with mixed anxiety and depressed mood: Secondary | ICD-10-CM | POA: Diagnosis not present

## 2016-07-02 DIAGNOSIS — R531 Weakness: Secondary | ICD-10-CM | POA: Diagnosis not present

## 2016-07-02 LAB — COMPREHENSIVE METABOLIC PANEL
ALK PHOS: 50 U/L (ref 38–126)
ALT: 16 U/L (ref 14–54)
AST: 18 U/L (ref 15–41)
Albumin: 4.6 g/dL (ref 3.5–5.0)
Anion gap: 9 (ref 5–15)
BILIRUBIN TOTAL: 0.7 mg/dL (ref 0.3–1.2)
BUN: 9 mg/dL (ref 6–20)
CALCIUM: 9.5 mg/dL (ref 8.9–10.3)
CO2: 26 mmol/L (ref 22–32)
Chloride: 104 mmol/L (ref 101–111)
Creatinine, Ser: 0.64 mg/dL (ref 0.44–1.00)
GFR calc non Af Amer: >60 mL/min (ref >60)
Glucose, Bld: 93 mg/dL (ref 65–99)
Potassium: 3.7 mmol/L (ref 3.5–5.1)
Sodium: 139 mmol/L (ref 135–145)
TOTAL PROTEIN: 7.1 g/dL (ref 6.5–8.1)

## 2016-07-02 LAB — CBC
HCT: 38.9 % (ref 36.0–46.0)
Hemoglobin: 13.6 g/dL (ref 12.0–15.0)
MCH: 32.3 pg (ref 26.0–34.0)
MCHC: 35 g/dL (ref 30.0–36.0)
MCV: 92.4 fL (ref 78.0–100.0)
Platelets: 176 10*3/uL (ref 150–400)
RBC: 4.21 MIL/uL (ref 3.87–5.11)
RDW: 12.5 % (ref 11.5–15.5)
WBC: 6.1 10*3/uL (ref 4.0–10.5)

## 2016-07-02 LAB — POC URINE PREG, ED: Preg Test, Ur: NEGATIVE

## 2016-07-02 NOTE — Discharge Instructions (Signed)
Please call Dr. Shelbie HutchingLindzen's office to schedule a follow up appointment. You can return to the emergency department if he develop new or worsening symptoms including slurred speech, severe headache, loss of control your bowels or bladder.

## 2016-07-02 NOTE — ED Provider Notes (Signed)
[facility]-EMERGENCY DEPT Provider Note   CSN: [id number] Arrival date & time: 07/02/16  1352     History   Chief Complaint Chief Complaint  Patient presents with  . Foot Pain  . Weakness    HPI Joyce Cortez is a 36 y.o. female with a h/o of fibromyalgia and partial seizures who presents to the Emergency Department with generalized weakness. She denies urinary or fecal incontinence. She states that she was at a PT appointment around 13:00 when her bilateral upper and lower extremities suddenly went weak with associated numbness and tingling. She denies seizure activity today. She reports a h/o of 2 similar episodes, the most recent occurring around 2-3 weeks ago, that occurred before she went to bed and resolved when she awoke the next morning.   She reports that she has been unable to walk since around Apr. 28-29. She states that she can remember the date because she reports two extremely stressful situations occurred with close friends within 24-48 hours prior. She states that it is extremely painful to apply pressure to the soles of her feet so she was having to walk on the lateral aspect. She reports associated pins and needle sensation that typically stays in the lower legs, but has intermittently extended upward into her thigh, but only on the right side.  She reports that she was seen by Dr. Alton RevereFeraru with [facility] on 6/7 and lab work completed, but they were supposed to call and schedule a follow up appointment to schedule her for an EMG and EEG, but didn't call her back for over a week. She reports there is a complication with a 24-hour urine collection, and it has not yet been processed. She reports that she has been seeking a neurologist specializes in fibromyalgia.   The history is provided by the patient. No language interpreter was used.    Past Medical History:  Diagnosis Date  . ADHD (attention deficit hyperactivity disorder)   . Allergy   . Asthma   .  Fibromyalgia     Patient Active Problem List   Diagnosis Date Noted  . Anxiety disorder 03/17/2016  . Eczema 03/17/2016  . Acne vulgaris 11/13/2012  . Fibromyalgia 10/17/2012  . Constipation 10/15/2011  . ANAL FISSURE 01/05/2010  . CORNS AND CALLUSES 01/05/2010  . Weakness of both lower extremities 03/25/2009  . SHOULDER PAIN 12/16/2008  . ABDOMINAL PAIN, LOWER 07/12/2008  . MYALGIA 06/14/2008  . HEADACHE 01/26/2008  . INFLUENZA 11/15/2007  . GERD 06/30/2007  . NECK PAIN 06/30/2007  . DEPRESSION 10/27/2006  . ADHD 10/27/2006  . Allergic rhinitis 10/27/2006  . ASTHMA 10/27/2006  . UTI'S, CHRONIC 10/27/2006  . SYNCOPE, HX OF 10/27/2006  . CHICKENPOX, HX OF 10/27/2006    Past Surgical History:  Procedure Laterality Date  . WISDOM TOOTH EXTRACTION      OB History    No data available       Home Medications    Prior to Admission medications   Medication Sig Start Date End Date Taking? Authorizing Provider  fexofenadine (ALLEGRA) 180 MG tablet Take 180 mg by mouth daily as needed.    Yes [provider]  ibuprofen (ADVIL,MOTRIN) 200 MG tablet Take 200 mg by mouth every 6 (six) hours as needed.   Yes [provider]  IUD's (PARAGARD INTRAUTERINE COPPER) IUD IUD 1 each by Intrauterine route continuous.    Yes [provider]    Family History Family History  Problem Relation Age of Onset  . Adopted:  Yes    Social History Social History  Substance Use Topics  . Smoking status: Former Games developer  . Smokeless tobacco: Never Used  . Alcohol use No     Allergies   Codeine; Doxycycline; and Pregabalin   Review of Systems Review of Systems  Constitutional: Negative for activity change, chills and fever.  HENT: Negative for congestion and ear pain.   Respiratory: Negative for shortness of breath.   Cardiovascular: Negative for chest pain.  Gastrointestinal: Negative for abdominal pain, diarrhea, nausea and vomiting.  Musculoskeletal:  Negative for back pain, neck pain and neck stiffness.  Skin: Negative for rash.  Neurological: Positive for seizures and weakness. Negative for dizziness, syncope and facial asymmetry.  Psychiatric/Behavioral: Negative for confusion. The patient is not nervous/anxious.    Physical Exam Updated Vital Signs BP 120/74 (BP Location: Left Arm)   Pulse 72   Temp 98.3 F (36.8 C) (Oral)   Resp 16   SpO2 100%   Physical Exam  Constitutional: No distress.  HENT:  Head: Normocephalic.  Eyes: Conjunctivae are normal.  Neck: Neck supple.  Cardiovascular: Normal rate, regular rhythm, normal heart sounds and intact distal pulses.  Exam reveals no gallop and no friction rub.   No murmur heard. Pulmonary/Chest: Effort normal. No respiratory distress.  Abdominal: Soft. She exhibits no distension.  Neurological: She is alert.  Patient is alert and oriented. Speaks in clear: Oriented sentences. 5 out of 5 strength of the bilateral lower extremities against resistance. 5 out of 5 grip strength bilaterally of the upper extremities. Sensation is intact and equal throughout. Cranial nerves II through XII are intact; of note, cranial nerve VII with decreased smile, but symmetric. Finger to nose is abnormal secondary to weakness of the upper shoulders. Patient is able to raise the bilateral lower extremities off the bed with tremendous effort. The bilateral upper extremities are held against her abdomen within muscle tone intact; however with passive adduction of the upper extremities, muscle tone is flaccid and her arms falls against the bed. Intermittent tremor noted to the right hand and forearm, which resolves on its own.   Skin: Skin is warm. No rash noted.  Psychiatric: Her behavior is normal.  Nursing note and vitals reviewed.    ED Treatments / Results  Labs (all labs ordered are listed, but only abnormal results are displayed) Labs Reviewed  COMPREHENSIVE METABOLIC PANEL  CBC  POC URINE PREG,  ED    EKG  EKG Interpretation None       Radiology Ct Head Wo Contrast  Result Date: 07/02/2016 CLINICAL DATA:  Weakness and slow speech. Tingling bilaterally, onset today at 1300 hours. History of fibromyalgia EXAM: CT HEAD WITHOUT CONTRAST TECHNIQUE: Contiguous axial images were obtained from the base of the skull through the vertex without intravenous contrast. COMPARISON:  None. FINDINGS: Brain: No evidence of acute infarction, hemorrhage, hydrocephalus, extra-axial collection or mass lesion/mass effect. Vascular: No hyperdense vessel or unexpected calcification. Skull: Normal. Negative for fracture or focal lesion. Sinuses/Orbits: No acute finding. Other: None IMPRESSION: No acute intracranial abnormality. Electronically Signed   By: Tollie Eth M.D.   On: 07/02/2016 16:39   Ct Cervical Spine Wo Contrast  Result Date: 07/02/2016 CLINICAL DATA:  Neck pain and weakness. History of fibromyalgia. Pain during physical therapy today. EXAM: CT CERVICAL SPINE WITHOUT CONTRAST TECHNIQUE: Multidetector CT imaging of the cervical spine was performed without intravenous contrast. Multiplanar CT image reconstructions were also generated. COMPARISON:  None. FINDINGS: Alignment: Negative for listhesis. Skull base  and vertebrae: Negative for acute fracture. Chronic/corticated lucency in the left first rib head. No evidence of bone lesion or endplate erosion. Incidental T1 limbus vertebra. Soft tissues and spinal canal: No prevertebral fluid or swelling. No visible canal hematoma. Disc levels: No focal or notable degenerative changes. No evidence of cord impingement. Upper chest: Negative IMPRESSION: No acute finding.  No evidence of cord impingement. Electronically Signed   By: Marnee Spring M.D.   On: 07/02/2016 18:04    Procedures Procedures (including critical care time)  Medications Ordered in ED Medications - No data to display   Initial Impression / Assessment and Plan / ED Course  I have  reviewed the triage vital signs and the nursing notes.  Pertinent labs & imaging results that were available during my care of the patient were reviewed by me and considered in my medical decision making (see chart for details).     Patient with acute bilateral upper and lower extremity weakness that began at approximately 1 PM today. Discussed the patient with Dr. Linwood Dibbles, attending physician. She reports 2 previous episodes similar that resolved on their own. Cranial nerves II through XII intact; CN VII slightly decreased on initial exam (limited ability to smile; however her smile is symmetric). No focal neurologic deficits. CT head negative for focal lesion, acute infarction, or hemorrhage. CT cervical spine does not demonstrate a spinal cord lesion or cord compression. No metabolic causes of weakness noted on CMP. Reviewed the patient's recent labs from her visit with neurology, including TSH, B12, or hepatic function panel, which were unremarkable. During observation in the emergency department, the patient has slowly regained use of the extremities and is able to move herself from a supine to a sitting position. At this time, I feel the patient is safe for discharge. No evidence of CVA, Guillain-Barr, or cauda equina. Will provide a referral for outpatient neurology follow-up. Strict return precautions given. The patient is stable for discharge at this time.    Final Clinical Impressions(s) / ED Diagnoses   Final diagnoses:  Weakness of both lower extremities  Weakness of both upper extremities    New Prescriptions Discharge Medication List as of 07/02/2016  7:06 PM       Barkley Boards, PA-C 07/03/16 4098    Linwood Dibbles, MD 07/04/16 1004

## 2016-07-02 NOTE — ED Notes (Signed)
While taking pt's vital signs pt stated " I am feeling better, look I can move my arm". Pt moved her arm on her own to allow bp cuff to be placed on it.

## 2016-07-02 NOTE — ED Triage Notes (Signed)
Pt with hx of fibromyalgia c/o severe pain to plantar surfaces of feet during physical therapy today, after which she collapsed on table, now feels weak bilaterally throughout body, slowed speech, tingling bilaterally onset today at about 1300. Neck, upper and lower extremities weak symmetrically bilaterally. Hx of 2 other similar episodes lasting about 10 minutes, resolved spontaneously. 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 Pt had infrared therapy on her feet and toes today.

## 2016-07-02 NOTE — ED Notes (Signed)
Patient transported to CT 

## 2016-07-05 ENCOUNTER — Telehealth: Payer: Self-pay | Admitting: Family Medicine

## 2016-07-05 DIAGNOSIS — M791 Myalgia, unspecified site: Secondary | ICD-10-CM

## 2016-07-05 DIAGNOSIS — R29898 Other symptoms and signs involving the musculoskeletal system: Secondary | ICD-10-CM

## 2016-07-05 NOTE — Telephone Encounter (Signed)
Pt has decided she would like to go to [facility] instead of local neuro. Pt needs a referral to  [facility]  Phone:[phone] Fax [phone]  Pt lost all motor skills for about 5 hours on Friday.  Pt went to the ED.  Pt would like Dr Clent RidgesFry to send as "Urgent" and they will get her in sooner.

## 2016-07-05 NOTE — Telephone Encounter (Signed)
We cannot keep doing multiple referrals everywhere. She already has an appt with [facility] for 08-13-16. I suggest she ask to get on their cancellation list so she can be seen sooner

## 2016-07-06 NOTE — Telephone Encounter (Signed)
the referral was done to [facility]

## 2016-07-06 NOTE — Telephone Encounter (Signed)
I spoke with pt and she has already called and cancelled the appointment with [facility].

## 2016-07-06 NOTE — Telephone Encounter (Signed)
I spoke with pt and gave message about referral.

## 2016-07-13 DIAGNOSIS — F4323 Adjustment disorder with mixed anxiety and depressed mood: Secondary | ICD-10-CM | POA: Diagnosis not present

## 2016-07-26 ENCOUNTER — Ambulatory Visit: Payer: BLUE CROSS/BLUE SHIELD | Admitting: Neurology

## 2016-07-27 DIAGNOSIS — M9901 Segmental and somatic dysfunction of cervical region: Secondary | ICD-10-CM | POA: Diagnosis not present

## 2016-07-27 DIAGNOSIS — M9902 Segmental and somatic dysfunction of thoracic region: Secondary | ICD-10-CM | POA: Diagnosis not present

## 2016-07-27 DIAGNOSIS — M9903 Segmental and somatic dysfunction of lumbar region: Secondary | ICD-10-CM | POA: Diagnosis not present

## 2016-08-10 DIAGNOSIS — M797 Fibromyalgia: Secondary | ICD-10-CM | POA: Diagnosis not present

## 2016-08-10 DIAGNOSIS — R55 Syncope and collapse: Secondary | ICD-10-CM | POA: Diagnosis not present

## 2016-08-10 DIAGNOSIS — R6889 Other general symptoms and signs: Secondary | ICD-10-CM | POA: Diagnosis not present

## 2016-08-10 DIAGNOSIS — G629 Polyneuropathy, unspecified: Secondary | ICD-10-CM | POA: Diagnosis not present

## 2016-08-17 ENCOUNTER — Telehealth: Payer: Self-pay | Admitting: Family Medicine

## 2016-08-17 NOTE — Telephone Encounter (Signed)
Pt temporarily handicap placard has expired and pt would like another one. Pt is going [facility]

## 2016-08-17 NOTE — Telephone Encounter (Signed)
done

## 2016-08-18 NOTE — Telephone Encounter (Signed)
Form is ready for pick up here at front office, I left a voice message for pt with this information.

## 2016-08-30 DIAGNOSIS — F4323 Adjustment disorder with mixed anxiety and depressed mood: Secondary | ICD-10-CM | POA: Diagnosis not present

## 2016-09-01 DIAGNOSIS — R51 Headache: Secondary | ICD-10-CM | POA: Diagnosis not present

## 2016-09-01 DIAGNOSIS — F4323 Adjustment disorder with mixed anxiety and depressed mood: Secondary | ICD-10-CM | POA: Diagnosis not present

## 2016-09-01 DIAGNOSIS — M797 Fibromyalgia: Secondary | ICD-10-CM | POA: Diagnosis not present

## 2016-09-03 DIAGNOSIS — R51 Headache: Secondary | ICD-10-CM | POA: Diagnosis not present

## 2016-09-03 DIAGNOSIS — F4323 Adjustment disorder with mixed anxiety and depressed mood: Secondary | ICD-10-CM | POA: Diagnosis not present

## 2016-09-03 DIAGNOSIS — M797 Fibromyalgia: Secondary | ICD-10-CM | POA: Diagnosis not present

## 2016-09-13 DIAGNOSIS — R55 Syncope and collapse: Secondary | ICD-10-CM | POA: Diagnosis not present

## 2016-09-13 DIAGNOSIS — R404 Transient alteration of awareness: Secondary | ICD-10-CM | POA: Diagnosis not present

## 2016-09-13 DIAGNOSIS — R6889 Other general symptoms and signs: Secondary | ICD-10-CM | POA: Diagnosis not present

## 2016-09-24 DIAGNOSIS — G629 Polyneuropathy, unspecified: Secondary | ICD-10-CM | POA: Diagnosis not present

## 2016-09-29 DIAGNOSIS — M797 Fibromyalgia: Secondary | ICD-10-CM | POA: Diagnosis not present

## 2016-09-29 DIAGNOSIS — F4323 Adjustment disorder with mixed anxiety and depressed mood: Secondary | ICD-10-CM | POA: Diagnosis not present

## 2016-09-29 DIAGNOSIS — R51 Headache: Secondary | ICD-10-CM | POA: Diagnosis not present

## 2016-10-05 DIAGNOSIS — F4323 Adjustment disorder with mixed anxiety and depressed mood: Secondary | ICD-10-CM | POA: Diagnosis not present

## 2016-10-05 DIAGNOSIS — R51 Headache: Secondary | ICD-10-CM | POA: Diagnosis not present

## 2016-10-05 DIAGNOSIS — M797 Fibromyalgia: Secondary | ICD-10-CM | POA: Diagnosis not present

## 2016-10-08 DIAGNOSIS — M797 Fibromyalgia: Secondary | ICD-10-CM | POA: Diagnosis not present

## 2016-10-08 DIAGNOSIS — F4323 Adjustment disorder with mixed anxiety and depressed mood: Secondary | ICD-10-CM | POA: Diagnosis not present

## 2016-10-08 DIAGNOSIS — R51 Headache: Secondary | ICD-10-CM | POA: Diagnosis not present

## 2016-10-13 DIAGNOSIS — M797 Fibromyalgia: Secondary | ICD-10-CM | POA: Diagnosis not present

## 2016-10-13 DIAGNOSIS — F4323 Adjustment disorder with mixed anxiety and depressed mood: Secondary | ICD-10-CM | POA: Diagnosis not present

## 2016-10-13 DIAGNOSIS — R51 Headache: Secondary | ICD-10-CM | POA: Diagnosis not present

## 2016-10-26 DIAGNOSIS — M797 Fibromyalgia: Secondary | ICD-10-CM | POA: Diagnosis not present

## 2016-10-26 DIAGNOSIS — R51 Headache: Secondary | ICD-10-CM | POA: Diagnosis not present

## 2016-10-26 DIAGNOSIS — F4323 Adjustment disorder with mixed anxiety and depressed mood: Secondary | ICD-10-CM | POA: Diagnosis not present

## 2016-11-02 DIAGNOSIS — F4323 Adjustment disorder with mixed anxiety and depressed mood: Secondary | ICD-10-CM | POA: Diagnosis not present

## 2016-11-09 DIAGNOSIS — F4323 Adjustment disorder with mixed anxiety and depressed mood: Secondary | ICD-10-CM | POA: Diagnosis not present

## 2016-11-09 DIAGNOSIS — M797 Fibromyalgia: Secondary | ICD-10-CM | POA: Diagnosis not present

## 2016-11-09 DIAGNOSIS — R51 Headache: Secondary | ICD-10-CM | POA: Diagnosis not present

## 2016-11-11 DIAGNOSIS — M797 Fibromyalgia: Secondary | ICD-10-CM | POA: Diagnosis not present

## 2016-11-11 DIAGNOSIS — M79605 Pain in left leg: Secondary | ICD-10-CM | POA: Diagnosis not present

## 2016-11-11 DIAGNOSIS — G43909 Migraine, unspecified, not intractable, without status migrainosus: Secondary | ICD-10-CM | POA: Diagnosis not present

## 2016-11-11 DIAGNOSIS — M79604 Pain in right leg: Secondary | ICD-10-CM | POA: Diagnosis not present

## 2016-11-12 ENCOUNTER — Encounter: Payer: Self-pay | Admitting: Family Medicine

## 2016-11-12 ENCOUNTER — Ambulatory Visit (INDEPENDENT_AMBULATORY_CARE_PROVIDER_SITE_OTHER): Payer: BLUE CROSS/BLUE SHIELD | Admitting: Family Medicine

## 2016-11-12 ENCOUNTER — Telehealth: Payer: Self-pay | Admitting: Family Medicine

## 2016-11-12 VITALS — BP 120/80 | Temp 98.0°F | Ht 64.0 in | Wt 163.0 lb

## 2016-11-12 DIAGNOSIS — J018 Other acute sinusitis: Secondary | ICD-10-CM

## 2016-11-12 MED ORDER — ESOMEPRAZOLE MAGNESIUM 40 MG PO CPDR: 40.0000 mg | DELAYED_RELEASE_CAPSULE | Freq: Every day | ORAL | 3 refills | Status: DC

## 2016-11-12 MED ORDER — LEVOFLOXACIN 500 MG PO TABS: 500.0000 mg | ORAL_TABLET | Freq: Every day | ORAL | 0 refills | Status: AC

## 2016-11-12 NOTE — Telephone Encounter (Signed)
Boyfriend called to advise pt has not started the nortriptyline yet. This has been prescribed by neurologist  Pt advised pharmacist she will not start until after the abx.  Pharmacy will wait to hear form the dr.   Pt also states Dr Clent RidgesFry was to prescribe Nexium, but it was not at the pharamcy.

## 2016-11-12 NOTE — Telephone Encounter (Signed)
[facility] [address] called to advise drug interaction between the  levofloxacin (LEVAQUIN) 500 MG tablet  And Nortriptyline  (?) (i do not see this med on list)  [facility] [address]

## 2016-11-12 NOTE — Telephone Encounter (Signed)
I spoke with pharmacy and went over below information, also sent script e-scribe.

## 2016-11-12 NOTE — Patient Instructions (Signed)
WE NOW OFFER   [facility] FAST TRACK!!!  SAME DAY Appointments for ACUTE CARE  Such as: Sprains, Injuries, cuts, abrasions, rashes, muscle pain, joint pain, back pain Colds, flu, sore throats, headache, allergies, cough, fever  Ear pain, sinus and eye infections Abdominal pain, nausea, vomiting, diarrhea, upset stomach Animal/insect bites  3 Easy Ways to Schedule: Walk-In Scheduling Call in scheduling Mychart Sign-up: [email or web address]         

## 2016-11-12 NOTE — Progress Notes (Signed)
   Subjective:    Patient ID: Joyce Cortez, female    DOB: 03/21/1980, 36 y.o.   MRN: [id number]  HPI Here for 3 days of sinus pressure, headache, PND, and a ST. No cough or fever. Using Nyquil. She saw her neurologist at [facility] yesterday to discuss migraines and fibromyalgia.    Review of Systems  Constitutional: Negative.   HENT: Positive for congestion, postnasal drip, sinus pain, sinus pressure and sore throat.   Eyes: Negative.   Respiratory: Negative.        Objective:   Physical Exam  Constitutional: She appears well-developed and well-nourished.  In her wheelchair   HENT:  Right Ear: External ear normal.  Left Ear: External ear normal.  Nose: Nose normal.  Mouth/Throat: Oropharynx is clear and moist.  Eyes: Conjunctivae are normal.  Neck: No thyromegaly present.  Pulmonary/Chest: Effort normal and breath sounds normal. No respiratory distress. She has no wheezes. She has no rales.  Lymphadenopathy:    She has no cervical adenopathy.          Assessment & Plan:  Sinusitis, treat with Levaquin. Written out of work today.  Gershon CraneStephen Jannett Schmall, MD

## 2016-11-12 NOTE — Telephone Encounter (Signed)
Tell the pharmacist as long as she does not start the Nortriptyline until after the Levaquin is gone, she should be fine. Call in Nexium 40 mg daily #90 with 3 rf

## 2016-11-16 DIAGNOSIS — F4323 Adjustment disorder with mixed anxiety and depressed mood: Secondary | ICD-10-CM | POA: Diagnosis not present

## 2016-11-19 DIAGNOSIS — M797 Fibromyalgia: Secondary | ICD-10-CM | POA: Diagnosis not present

## 2016-11-19 DIAGNOSIS — R51 Headache: Secondary | ICD-10-CM | POA: Diagnosis not present

## 2016-11-19 DIAGNOSIS — F4323 Adjustment disorder with mixed anxiety and depressed mood: Secondary | ICD-10-CM | POA: Diagnosis not present

## 2016-11-23 ENCOUNTER — Encounter: Payer: Self-pay | Admitting: Family Medicine

## 2016-11-23 ENCOUNTER — Ambulatory Visit: Payer: BLUE CROSS/BLUE SHIELD | Admitting: Family Medicine

## 2016-11-23 VITALS — BP 100/68 | Temp 98.7°F

## 2016-11-23 DIAGNOSIS — J018 Other acute sinusitis: Secondary | ICD-10-CM

## 2016-11-23 DIAGNOSIS — R51 Headache: Secondary | ICD-10-CM | POA: Diagnosis not present

## 2016-11-23 DIAGNOSIS — M797 Fibromyalgia: Secondary | ICD-10-CM | POA: Diagnosis not present

## 2016-11-23 DIAGNOSIS — F4323 Adjustment disorder with mixed anxiety and depressed mood: Secondary | ICD-10-CM | POA: Diagnosis not present

## 2016-11-23 MED ORDER — LEVOFLOXACIN 500 MG PO TABS: 500.0000 mg | ORAL_TABLET | Freq: Every day | ORAL | 0 refills | Status: AC

## 2016-11-23 NOTE — Progress Notes (Signed)
   Subjective:    Patient ID: Joyce Cortez, female    DOB: 05/31/1980, 36 y.o.   MRN: [id number]  HPI Here for continued sinus symptoms like pressure, PND, and a dry cough. She took 10 days of Levaquin and felt somewhat better. No fever.    Review of Systems  Constitutional: Negative.   HENT: Positive for congestion, postnasal drip, sinus pressure and sinus pain. Negative for sore throat.   Eyes: Negative.   Respiratory: Positive for cough.        Objective:   Physical Exam  Constitutional: She appears well-developed and well-nourished.  HENT:  Right Ear: External ear normal.  Left Ear: External ear normal.  Nose: Nose normal.  Mouth/Throat: Oropharynx is clear and moist.  Eyes: Conjunctivae are normal.  Neck: No thyromegaly present.  Pulmonary/Chest: Effort normal and breath sounds normal. No respiratory distress. She has no wheezes. She has no rales.  Lymphadenopathy:    She has no cervical adenopathy.          Assessment & Plan:  Partially treated sinusitis. We will give her another 10 days of Levaquin. Recheck prn.  Joyce CraneStephen Lupita Rosales, MD

## 2016-11-23 NOTE — Patient Instructions (Signed)
WE NOW OFFER   [facility] FAST TRACK!!!  SAME DAY Appointments for ACUTE CARE  Such as: Sprains, Injuries, cuts, abrasions, rashes, muscle pain, joint pain, back pain Colds, flu, sore throats, headache, allergies, cough, fever  Ear pain, sinus and eye infections Abdominal pain, nausea, vomiting, diarrhea, upset stomach Animal/insect bites  3 Easy Ways to Schedule: Walk-In Scheduling Call in scheduling Mychart Sign-up: [email or web address]         

## 2016-11-24 DIAGNOSIS — F4323 Adjustment disorder with mixed anxiety and depressed mood: Secondary | ICD-10-CM | POA: Diagnosis not present

## 2016-11-24 DIAGNOSIS — M797 Fibromyalgia: Secondary | ICD-10-CM | POA: Diagnosis not present

## 2016-11-24 DIAGNOSIS — R51 Headache: Secondary | ICD-10-CM | POA: Diagnosis not present

## 2016-11-29 DIAGNOSIS — N946 Dysmenorrhea, unspecified: Secondary | ICD-10-CM | POA: Diagnosis not present

## 2016-12-01 DIAGNOSIS — R51 Headache: Secondary | ICD-10-CM | POA: Diagnosis not present

## 2016-12-01 DIAGNOSIS — F4323 Adjustment disorder with mixed anxiety and depressed mood: Secondary | ICD-10-CM | POA: Diagnosis not present

## 2016-12-01 DIAGNOSIS — M797 Fibromyalgia: Secondary | ICD-10-CM | POA: Diagnosis not present

## 2016-12-07 DIAGNOSIS — F4323 Adjustment disorder with mixed anxiety and depressed mood: Secondary | ICD-10-CM | POA: Diagnosis not present

## 2016-12-08 ENCOUNTER — Ambulatory Visit: Payer: BLUE CROSS/BLUE SHIELD | Admitting: Family Medicine

## 2016-12-08 ENCOUNTER — Encounter: Payer: Self-pay | Admitting: Family Medicine

## 2016-12-08 VITALS — BP 104/60 | HR 82 | Temp 98.5°F | Wt 164.6 lb

## 2016-12-08 DIAGNOSIS — J0101 Acute recurrent maxillary sinusitis: Secondary | ICD-10-CM

## 2016-12-08 MED ORDER — CLARITHROMYCIN 500 MG PO TABS: 500.0000 mg | ORAL_TABLET | Freq: Two times a day (BID) | ORAL | 0 refills | Status: DC

## 2016-12-08 NOTE — Progress Notes (Signed)
   Subjective:    Patient ID: Joyce Cortez, female    DOB: 11/12/1980, 36 y.o.   MRN: [id number]  HPI Here for recurrent sinus problems. She was seen on 11-12-16 and again on 11-23-16 for sinus infections and each time was treated with Levaquin. Each time she improved for awhile but then the symptoms returned. Today she has sinus pressure, PND, and a ST. No cough or fever. On Mucinex.    Review of Systems  Constitutional: Negative.   HENT: Positive for congestion, postnasal drip, sinus pressure, sinus pain and sore throat.   Eyes: Negative.   Respiratory: Negative.        Objective:   Physical Exam  Constitutional: She appears well-developed and well-nourished.  HENT:  Right Ear: External ear normal.  Left Ear: External ear normal.  Nose: Nose normal.  Mouth/Throat: Oropharynx is clear and moist.  Eyes: Conjunctivae are normal.  Neck: No thyromegaly present.  Pulmonary/Chest: Effort normal and breath sounds normal. No respiratory distress. She has no wheezes. She has no rales.  Lymphadenopathy:    She has no cervical adenopathy.          Assessment & Plan:  Recurrent sinusitis, treat with Biaxin.  Gershon CraneStephen Fry, MD

## 2016-12-13 DIAGNOSIS — M545 Low back pain: Secondary | ICD-10-CM | POA: Diagnosis not present

## 2016-12-13 DIAGNOSIS — M797 Fibromyalgia: Secondary | ICD-10-CM | POA: Diagnosis not present

## 2016-12-13 DIAGNOSIS — F4323 Adjustment disorder with mixed anxiety and depressed mood: Secondary | ICD-10-CM | POA: Diagnosis not present

## 2016-12-13 DIAGNOSIS — G609 Hereditary and idiopathic neuropathy, unspecified: Secondary | ICD-10-CM | POA: Diagnosis not present

## 2016-12-14 DIAGNOSIS — F4323 Adjustment disorder with mixed anxiety and depressed mood: Secondary | ICD-10-CM | POA: Diagnosis not present

## 2016-12-20 ENCOUNTER — Other Ambulatory Visit: Payer: Self-pay | Admitting: Family Medicine

## 2016-12-20 NOTE — Telephone Encounter (Signed)
Last refilled 12/07/2016 disp 20 with no refills. Sent to PCP for approval.

## 2016-12-20 NOTE — Telephone Encounter (Signed)
Copied from CRM [id number]. Topic: Quick Communication - Rx Refill/Question >> Dec 20, 2016  2:48 PM Crist InfanteHarrald, Kathy J wrote: Has the patient contacted their pharmacy? {yes  Boyfriend calling for status of the refill clarithromycin (BIAXIN) 500 MG tablet He states pt is feeling pretty bad.  Preferred Pharmacy (with phone number or street name): [facility] [id number] Ginette Otto- [place], [place] - [address] [phone] (Phone) [phone] (Fax)

## 2016-12-23 ENCOUNTER — Ambulatory Visit: Payer: BLUE CROSS/BLUE SHIELD | Admitting: Family Medicine

## 2016-12-28 ENCOUNTER — Other Ambulatory Visit: Payer: Self-pay | Admitting: Family Medicine

## 2016-12-28 NOTE — Telephone Encounter (Signed)
Copied from CRM [id number]. Topic: Quick Communication - See Telephone Encounter >> Dec 28, 2016  1:56 PM Landry MellowFoltz, Melissa J wrote: CRM for notification. See Telephone encounter for:   12/28/16. Pt called to let us know that she is having fax sent over from pharmacy for abx,she is not feeling better.  Cb number is [phone]

## 2016-12-29 ENCOUNTER — Other Ambulatory Visit: Payer: Self-pay | Admitting: Family Medicine

## 2016-12-29 DIAGNOSIS — F4323 Adjustment disorder with mixed anxiety and depressed mood: Secondary | ICD-10-CM | POA: Diagnosis not present

## 2016-12-29 DIAGNOSIS — M797 Fibromyalgia: Secondary | ICD-10-CM | POA: Diagnosis not present

## 2016-12-29 DIAGNOSIS — G609 Hereditary and idiopathic neuropathy, unspecified: Secondary | ICD-10-CM | POA: Diagnosis not present

## 2016-12-29 DIAGNOSIS — M545 Low back pain: Secondary | ICD-10-CM | POA: Diagnosis not present

## 2016-12-30 ENCOUNTER — Encounter: Payer: Self-pay | Admitting: Adult Health

## 2016-12-30 ENCOUNTER — Ambulatory Visit: Payer: BLUE CROSS/BLUE SHIELD | Admitting: Adult Health

## 2016-12-30 VITALS — BP 118/80 | HR 81 | Temp 98.3°F

## 2016-12-30 DIAGNOSIS — J0181 Other acute recurrent sinusitis: Secondary | ICD-10-CM | POA: Diagnosis not present

## 2016-12-30 NOTE — Telephone Encounter (Signed)
Call placed to pt. At 2:00 PM to discuss symptoms.  The pt. Voiced frustration that she just needs an antibiotic.  C/o watery eyes, scratchy throat, intermittent coughing with thick clear to cloudy mucus expectorated, continued sinus drainage and sinus headache, and intermittently feeling hot/cold.  Reported due to her Fibromyalgia, her immune system is different than someone else, and she needs to get on an antibiotic "today".  Stated she has been sick since Oct. And just wants to get better.  Noted pt has been on Biaxin 500 mg BID for 2 rounds.  Advised since her symptoms are not improving she will need to be seen in the office.  Pt. Agreed.  Appt. Given for 3:15 PM today.

## 2016-12-30 NOTE — Telephone Encounter (Signed)
Left message for pt. To call  Back to discuss symptoms.

## 2016-12-30 NOTE — Telephone Encounter (Signed)
Sent to PCP for approval.  

## 2016-12-30 NOTE — Progress Notes (Signed)
Subjective:    Patient ID: Joyce Cortez, female    DOB: 02/18/1980, 36 y.o.   MRN: [id number]  HPI  36 year old female who  has a past medical history of ADHD (attention deficit hyperactivity disorder), Allergy, Asthma, and Fibromyalgia. She is a patient of Dr. Clent RidgesFry who I am seeing today for the issue of continued sinusitis. Her current symptoms include that of rhinorrhea, sore throat, right ear pain and fatigue. Today she finished a 10 day course of Biaxin that was called in by her PCP. Previous to that she was treated in November for a sinusitis with Biaxin and previous to that a 10 day course of Levaquin. She would like " a refill of an antibiotic that I can take three times a day to that it stays in my system" She has been managing her symptoms with Nyquil at home   She would also like to be written out of work for the rest of the month so she can recover.    Review of Systems See HPI   Past Medical History:  Diagnosis Date  . ADHD (attention deficit hyperactivity disorder)   . Allergy   . Asthma   . Fibromyalgia     Social History   Socioeconomic History  . Marital status: Single    Spouse name: Not on file  . Number of children: 0  . Years of education: Not on file  . Highest education level: Not on file  Social Needs  . Financial resource strain: Not on file  . Food insecurity - worry: Not on file  . Food insecurity - inability: Not on file  . Transportation needs - medical: Not on file  . Transportation needs - non-medical: Not on file  Occupational History  . Occupation: Event organiserndependent Contractor  Tobacco Use  . Smoking status: Former Games developermoker  . Smokeless tobacco: Never Used  Substance and Sexual Activity  . Alcohol use: No    Alcohol/week: 0.0 oz  . Drug use: Yes    Types: Marijuana    Comment: occ  . Sexual activity: Not on file  Other Topics Concern  . Not on file  Social History Narrative  . Not on file    Past Surgical History:  Procedure  Laterality Date  . WISDOM TOOTH EXTRACTION      Family History  Adopted: Yes    Allergies  Allergen Reactions  . Codeine Nausea Only  . Doxycycline Nausea And Vomiting  . Pregabalin Other (See Comments)    Current Outpatient Medications on File Prior to Visit  Medication Sig Dispense Refill  . clarithromycin (BIAXIN) 500 MG tablet TAKE 1 TABLET BY MOUTH TWICE A DAY 20 tablet 0  . esomeprazole (NEXIUM) 40 MG capsule Take 1 capsule (40 mg total) by mouth daily. 90 capsule 3  . IUD's (PARAGARD INTRAUTERINE COPPER) IUD IUD 1 each by Intrauterine route continuous.     . fexofenadine (ALLEGRA) 180 MG tablet Take 180 mg by mouth daily as needed.     Marland Kitchen. ibuprofen (ADVIL,MOTRIN) 200 MG tablet Take 200 mg by mouth every 6 (six) hours as needed.     No current facility-administered medications on file prior to visit.     BP 118/80 (BP Location: Left Arm)   Pulse 81   Temp 98.3 F (36.8 C) (Oral)   SpO2 97%       Objective:   Physical Exam  Constitutional: She is oriented to person, place, and time. She appears well-developed and well-nourished. No  distress.  HENT:  Head: Normocephalic and atraumatic.  Right Ear: Hearing, tympanic membrane, external ear and ear canal normal.  Left Ear: Hearing, tympanic membrane, external ear and ear canal normal.  Nose: Nose normal. No mucosal edema or rhinorrhea. Right sinus exhibits no maxillary sinus tenderness and no frontal sinus tenderness. Left sinus exhibits no maxillary sinus tenderness and no frontal sinus tenderness.  Mouth/Throat: Uvula is midline, oropharynx is clear and moist and mucous membranes are normal. No oropharyngeal exudate.  Eyes: Conjunctivae and EOM are normal. Pupils are equal, round, and reactive to light. Right eye exhibits no discharge. Left eye exhibits no discharge. No scleral icterus.  Neck: Normal range of motion. Neck supple.  Cardiovascular: Normal rate, regular rhythm, normal heart sounds and intact distal pulses.  Exam reveals no gallop and no friction rub.  No murmur heard. Pulmonary/Chest: Effort normal and breath sounds normal. No respiratory distress. She has no wheezes. She has no rales. She exhibits no tenderness.  Lymphadenopathy:    She has no cervical adenopathy.  Neurological: She is alert and oriented to person, place, and time.  Skin: Skin is warm and dry. No rash noted. She is not diaphoretic. No erythema. No pallor.  Psychiatric: She has a normal mood and affect. Her behavior is normal. Judgment and thought content normal.  Nursing note and vitals reviewed.     Assessment & Plan:  1. Other acute recurrent sinusitis - No signs of sinusitis. She was advised that I would not write for any additional antibiotics at this time as she did not have any signs. We discussed that additional antibiotics may cause more harm then good. We discussed C diff. She was not happy that I would not prescribe her antibiotics at this time. She states " I will just go to the ER because I cannot survive this".   - Ambulatory referral to ENT - Work note written for 2 days  - Follow up as needed  Joyce Freesory Vanesha Athens, NP

## 2017-01-03 DIAGNOSIS — J3089 Other allergic rhinitis: Secondary | ICD-10-CM | POA: Diagnosis not present

## 2017-01-03 DIAGNOSIS — J329 Chronic sinusitis, unspecified: Secondary | ICD-10-CM | POA: Diagnosis not present

## 2017-01-04 DIAGNOSIS — M797 Fibromyalgia: Secondary | ICD-10-CM | POA: Diagnosis not present

## 2017-01-04 DIAGNOSIS — M545 Low back pain: Secondary | ICD-10-CM | POA: Diagnosis not present

## 2017-01-04 DIAGNOSIS — F4323 Adjustment disorder with mixed anxiety and depressed mood: Secondary | ICD-10-CM | POA: Diagnosis not present

## 2017-01-04 DIAGNOSIS — G609 Hereditary and idiopathic neuropathy, unspecified: Secondary | ICD-10-CM | POA: Diagnosis not present

## 2017-01-12 DIAGNOSIS — M9901 Segmental and somatic dysfunction of cervical region: Secondary | ICD-10-CM | POA: Diagnosis not present

## 2017-01-12 DIAGNOSIS — M9902 Segmental and somatic dysfunction of thoracic region: Secondary | ICD-10-CM | POA: Diagnosis not present

## 2017-01-12 DIAGNOSIS — M9903 Segmental and somatic dysfunction of lumbar region: Secondary | ICD-10-CM | POA: Diagnosis not present

## 2017-01-13 DIAGNOSIS — M9901 Segmental and somatic dysfunction of cervical region: Secondary | ICD-10-CM | POA: Diagnosis not present

## 2017-01-13 DIAGNOSIS — M797 Fibromyalgia: Secondary | ICD-10-CM | POA: Diagnosis not present

## 2017-01-13 DIAGNOSIS — F4323 Adjustment disorder with mixed anxiety and depressed mood: Secondary | ICD-10-CM | POA: Diagnosis not present

## 2017-01-13 DIAGNOSIS — G609 Hereditary and idiopathic neuropathy, unspecified: Secondary | ICD-10-CM | POA: Diagnosis not present

## 2017-01-13 DIAGNOSIS — M545 Low back pain: Secondary | ICD-10-CM | POA: Diagnosis not present

## 2017-01-13 DIAGNOSIS — M9902 Segmental and somatic dysfunction of thoracic region: Secondary | ICD-10-CM | POA: Diagnosis not present

## 2017-01-13 DIAGNOSIS — M9903 Segmental and somatic dysfunction of lumbar region: Secondary | ICD-10-CM | POA: Diagnosis not present

## 2017-01-17 DIAGNOSIS — G609 Hereditary and idiopathic neuropathy, unspecified: Secondary | ICD-10-CM | POA: Diagnosis not present

## 2017-01-17 DIAGNOSIS — M797 Fibromyalgia: Secondary | ICD-10-CM | POA: Diagnosis not present

## 2017-01-17 DIAGNOSIS — M545 Low back pain: Secondary | ICD-10-CM | POA: Diagnosis not present

## 2017-01-17 DIAGNOSIS — F4323 Adjustment disorder with mixed anxiety and depressed mood: Secondary | ICD-10-CM | POA: Diagnosis not present

## 2017-01-24 DIAGNOSIS — F4323 Adjustment disorder with mixed anxiety and depressed mood: Secondary | ICD-10-CM | POA: Diagnosis not present

## 2017-01-26 DIAGNOSIS — M797 Fibromyalgia: Secondary | ICD-10-CM | POA: Diagnosis not present

## 2017-01-26 DIAGNOSIS — M545 Low back pain: Secondary | ICD-10-CM | POA: Diagnosis not present

## 2017-01-26 DIAGNOSIS — F4323 Adjustment disorder with mixed anxiety and depressed mood: Secondary | ICD-10-CM | POA: Diagnosis not present

## 2017-01-26 DIAGNOSIS — G609 Hereditary and idiopathic neuropathy, unspecified: Secondary | ICD-10-CM | POA: Diagnosis not present

## 2017-01-28 DIAGNOSIS — F4323 Adjustment disorder with mixed anxiety and depressed mood: Secondary | ICD-10-CM | POA: Diagnosis not present

## 2017-01-28 DIAGNOSIS — G609 Hereditary and idiopathic neuropathy, unspecified: Secondary | ICD-10-CM | POA: Diagnosis not present

## 2017-01-28 DIAGNOSIS — M797 Fibromyalgia: Secondary | ICD-10-CM | POA: Diagnosis not present

## 2017-01-28 DIAGNOSIS — M545 Low back pain: Secondary | ICD-10-CM | POA: Diagnosis not present

## 2017-01-28 DIAGNOSIS — M542 Cervicalgia: Secondary | ICD-10-CM | POA: Diagnosis not present

## 2017-01-31 DIAGNOSIS — F4323 Adjustment disorder with mixed anxiety and depressed mood: Secondary | ICD-10-CM | POA: Diagnosis not present

## 2017-02-01 DIAGNOSIS — M797 Fibromyalgia: Secondary | ICD-10-CM | POA: Diagnosis not present

## 2017-02-01 DIAGNOSIS — G609 Hereditary and idiopathic neuropathy, unspecified: Secondary | ICD-10-CM | POA: Diagnosis not present

## 2017-02-01 DIAGNOSIS — M545 Low back pain: Secondary | ICD-10-CM | POA: Diagnosis not present

## 2017-02-01 DIAGNOSIS — F4323 Adjustment disorder with mixed anxiety and depressed mood: Secondary | ICD-10-CM | POA: Diagnosis not present

## 2017-02-03 DIAGNOSIS — G609 Hereditary and idiopathic neuropathy, unspecified: Secondary | ICD-10-CM | POA: Diagnosis not present

## 2017-02-03 DIAGNOSIS — M545 Low back pain: Secondary | ICD-10-CM | POA: Diagnosis not present

## 2017-02-03 DIAGNOSIS — M797 Fibromyalgia: Secondary | ICD-10-CM | POA: Diagnosis not present

## 2017-02-03 DIAGNOSIS — F4323 Adjustment disorder with mixed anxiety and depressed mood: Secondary | ICD-10-CM | POA: Diagnosis not present

## 2017-02-08 DIAGNOSIS — F4323 Adjustment disorder with mixed anxiety and depressed mood: Secondary | ICD-10-CM | POA: Diagnosis not present

## 2017-02-09 DIAGNOSIS — F4323 Adjustment disorder with mixed anxiety and depressed mood: Secondary | ICD-10-CM | POA: Diagnosis not present

## 2017-02-09 DIAGNOSIS — M797 Fibromyalgia: Secondary | ICD-10-CM | POA: Diagnosis not present

## 2017-02-09 DIAGNOSIS — G609 Hereditary and idiopathic neuropathy, unspecified: Secondary | ICD-10-CM | POA: Diagnosis not present

## 2017-02-09 DIAGNOSIS — M545 Low back pain: Secondary | ICD-10-CM | POA: Diagnosis not present

## 2017-02-14 DIAGNOSIS — F4323 Adjustment disorder with mixed anxiety and depressed mood: Secondary | ICD-10-CM | POA: Diagnosis not present

## 2017-02-14 DIAGNOSIS — M797 Fibromyalgia: Secondary | ICD-10-CM | POA: Diagnosis not present

## 2017-02-14 DIAGNOSIS — G609 Hereditary and idiopathic neuropathy, unspecified: Secondary | ICD-10-CM | POA: Diagnosis not present

## 2017-02-14 DIAGNOSIS — M545 Low back pain: Secondary | ICD-10-CM | POA: Diagnosis not present

## 2017-02-17 DIAGNOSIS — M545 Low back pain: Secondary | ICD-10-CM | POA: Diagnosis not present

## 2017-02-17 DIAGNOSIS — F4323 Adjustment disorder with mixed anxiety and depressed mood: Secondary | ICD-10-CM | POA: Diagnosis not present

## 2017-02-17 DIAGNOSIS — M797 Fibromyalgia: Secondary | ICD-10-CM | POA: Diagnosis not present

## 2017-02-17 DIAGNOSIS — G609 Hereditary and idiopathic neuropathy, unspecified: Secondary | ICD-10-CM | POA: Diagnosis not present

## 2017-02-21 ENCOUNTER — Telehealth: Payer: Self-pay | Admitting: Family Medicine

## 2017-02-21 NOTE — Telephone Encounter (Signed)
Copied from CRM [id number]. Topic: Quick Communication - See Telephone Encounter >> Feb 21, 2017  5:06 PM Terisa Starraylor, Brittany L wrote: CRM for notification. See Telephone encounter for:   02/21/17.  Patient is requesting a renewal on her handicap sticker. Call back is [phone]

## 2017-02-23 NOTE — Telephone Encounter (Signed)
Done

## 2017-02-23 NOTE — Telephone Encounter (Signed)
Placed for Dr. Fry to fill out  

## 2017-02-24 DIAGNOSIS — F4323 Adjustment disorder with mixed anxiety and depressed mood: Secondary | ICD-10-CM | POA: Diagnosis not present

## 2017-02-24 DIAGNOSIS — M545 Low back pain: Secondary | ICD-10-CM | POA: Diagnosis not present

## 2017-02-24 DIAGNOSIS — M797 Fibromyalgia: Secondary | ICD-10-CM | POA: Diagnosis not present

## 2017-02-24 DIAGNOSIS — G609 Hereditary and idiopathic neuropathy, unspecified: Secondary | ICD-10-CM | POA: Diagnosis not present

## 2017-02-24 NOTE — Telephone Encounter (Signed)
Called pt and left a VM that form is ready would she like this mailed or placed for pick up?

## 2017-02-25 NOTE — Telephone Encounter (Signed)
Called pt and left a VM to call back. Would pt like to pick up or have this mailed?

## 2017-02-28 NOTE — Telephone Encounter (Signed)
Called pt and left a VM that form has been completed. Placed a copy to be scanned into pt's chart. Mail to pt. OK to close out message.

## 2017-03-02 DIAGNOSIS — F4323 Adjustment disorder with mixed anxiety and depressed mood: Secondary | ICD-10-CM | POA: Diagnosis not present

## 2017-03-02 DIAGNOSIS — M797 Fibromyalgia: Secondary | ICD-10-CM | POA: Diagnosis not present

## 2017-03-02 DIAGNOSIS — M545 Low back pain: Secondary | ICD-10-CM | POA: Diagnosis not present

## 2017-03-02 DIAGNOSIS — M791 Myalgia, unspecified site: Secondary | ICD-10-CM | POA: Diagnosis not present

## 2017-03-02 DIAGNOSIS — G609 Hereditary and idiopathic neuropathy, unspecified: Secondary | ICD-10-CM | POA: Diagnosis not present

## 2017-03-03 DIAGNOSIS — F4323 Adjustment disorder with mixed anxiety and depressed mood: Secondary | ICD-10-CM | POA: Diagnosis not present

## 2017-03-03 DIAGNOSIS — M797 Fibromyalgia: Secondary | ICD-10-CM | POA: Diagnosis not present

## 2017-03-03 DIAGNOSIS — G609 Hereditary and idiopathic neuropathy, unspecified: Secondary | ICD-10-CM | POA: Diagnosis not present

## 2017-03-03 DIAGNOSIS — M545 Low back pain: Secondary | ICD-10-CM | POA: Diagnosis not present

## 2017-03-07 DIAGNOSIS — M545 Low back pain: Secondary | ICD-10-CM | POA: Diagnosis not present

## 2017-03-07 DIAGNOSIS — G609 Hereditary and idiopathic neuropathy, unspecified: Secondary | ICD-10-CM | POA: Diagnosis not present

## 2017-03-07 DIAGNOSIS — M797 Fibromyalgia: Secondary | ICD-10-CM | POA: Diagnosis not present

## 2017-03-07 DIAGNOSIS — F4323 Adjustment disorder with mixed anxiety and depressed mood: Secondary | ICD-10-CM | POA: Diagnosis not present

## 2017-03-08 ENCOUNTER — Ambulatory Visit (INDEPENDENT_AMBULATORY_CARE_PROVIDER_SITE_OTHER): Payer: BLUE CROSS/BLUE SHIELD | Admitting: Psychology

## 2017-03-08 DIAGNOSIS — J322 Chronic ethmoidal sinusitis: Secondary | ICD-10-CM | POA: Diagnosis not present

## 2017-03-08 DIAGNOSIS — F320 Major depressive disorder, single episode, mild: Secondary | ICD-10-CM

## 2017-03-14 DIAGNOSIS — M545 Low back pain: Secondary | ICD-10-CM | POA: Diagnosis not present

## 2017-03-14 DIAGNOSIS — F4323 Adjustment disorder with mixed anxiety and depressed mood: Secondary | ICD-10-CM | POA: Diagnosis not present

## 2017-03-14 DIAGNOSIS — G609 Hereditary and idiopathic neuropathy, unspecified: Secondary | ICD-10-CM | POA: Diagnosis not present

## 2017-03-14 DIAGNOSIS — M797 Fibromyalgia: Secondary | ICD-10-CM | POA: Diagnosis not present

## 2017-03-15 DIAGNOSIS — M797 Fibromyalgia: Secondary | ICD-10-CM | POA: Diagnosis not present

## 2017-03-15 DIAGNOSIS — M545 Low back pain: Secondary | ICD-10-CM | POA: Diagnosis not present

## 2017-03-15 DIAGNOSIS — R238 Other skin changes: Secondary | ICD-10-CM | POA: Diagnosis not present

## 2017-03-15 DIAGNOSIS — F4323 Adjustment disorder with mixed anxiety and depressed mood: Secondary | ICD-10-CM | POA: Diagnosis not present

## 2017-03-15 DIAGNOSIS — L219 Seborrheic dermatitis, unspecified: Secondary | ICD-10-CM | POA: Diagnosis not present

## 2017-03-15 DIAGNOSIS — L309 Dermatitis, unspecified: Secondary | ICD-10-CM | POA: Diagnosis not present

## 2017-03-15 DIAGNOSIS — G609 Hereditary and idiopathic neuropathy, unspecified: Secondary | ICD-10-CM | POA: Diagnosis not present

## 2017-03-18 DIAGNOSIS — M25552 Pain in left hip: Secondary | ICD-10-CM | POA: Diagnosis not present

## 2017-03-18 DIAGNOSIS — M25551 Pain in right hip: Secondary | ICD-10-CM | POA: Diagnosis not present

## 2017-03-18 DIAGNOSIS — M25562 Pain in left knee: Secondary | ICD-10-CM | POA: Diagnosis not present

## 2017-03-22 DIAGNOSIS — M545 Low back pain: Secondary | ICD-10-CM | POA: Diagnosis not present

## 2017-03-22 DIAGNOSIS — G609 Hereditary and idiopathic neuropathy, unspecified: Secondary | ICD-10-CM | POA: Diagnosis not present

## 2017-03-22 DIAGNOSIS — M797 Fibromyalgia: Secondary | ICD-10-CM | POA: Diagnosis not present

## 2017-03-22 DIAGNOSIS — F4323 Adjustment disorder with mixed anxiety and depressed mood: Secondary | ICD-10-CM | POA: Diagnosis not present

## 2017-03-24 ENCOUNTER — Other Ambulatory Visit: Payer: Self-pay | Admitting: Family Medicine

## 2017-03-24 DIAGNOSIS — J309 Allergic rhinitis, unspecified: Secondary | ICD-10-CM

## 2017-03-28 DIAGNOSIS — M545 Low back pain: Secondary | ICD-10-CM | POA: Diagnosis not present

## 2017-03-28 DIAGNOSIS — M797 Fibromyalgia: Secondary | ICD-10-CM | POA: Diagnosis not present

## 2017-03-28 DIAGNOSIS — F4323 Adjustment disorder with mixed anxiety and depressed mood: Secondary | ICD-10-CM | POA: Diagnosis not present

## 2017-03-28 DIAGNOSIS — G609 Hereditary and idiopathic neuropathy, unspecified: Secondary | ICD-10-CM | POA: Diagnosis not present

## 2017-03-30 DIAGNOSIS — M797 Fibromyalgia: Secondary | ICD-10-CM | POA: Diagnosis not present

## 2017-03-31 DIAGNOSIS — F4323 Adjustment disorder with mixed anxiety and depressed mood: Secondary | ICD-10-CM | POA: Diagnosis not present

## 2017-03-31 DIAGNOSIS — M545 Low back pain: Secondary | ICD-10-CM | POA: Diagnosis not present

## 2017-03-31 DIAGNOSIS — M797 Fibromyalgia: Secondary | ICD-10-CM | POA: Diagnosis not present

## 2017-03-31 DIAGNOSIS — G609 Hereditary and idiopathic neuropathy, unspecified: Secondary | ICD-10-CM | POA: Diagnosis not present

## 2017-04-04 DIAGNOSIS — M545 Low back pain: Secondary | ICD-10-CM | POA: Diagnosis not present

## 2017-04-04 DIAGNOSIS — G609 Hereditary and idiopathic neuropathy, unspecified: Secondary | ICD-10-CM | POA: Diagnosis not present

## 2017-04-04 DIAGNOSIS — M797 Fibromyalgia: Secondary | ICD-10-CM | POA: Diagnosis not present

## 2017-04-04 DIAGNOSIS — F4323 Adjustment disorder with mixed anxiety and depressed mood: Secondary | ICD-10-CM | POA: Diagnosis not present

## 2017-04-05 DIAGNOSIS — F4323 Adjustment disorder with mixed anxiety and depressed mood: Secondary | ICD-10-CM | POA: Diagnosis not present

## 2017-04-07 ENCOUNTER — Ambulatory Visit (INDEPENDENT_AMBULATORY_CARE_PROVIDER_SITE_OTHER): Payer: BLUE CROSS/BLUE SHIELD | Admitting: Psychology

## 2017-04-07 DIAGNOSIS — F320 Major depressive disorder, single episode, mild: Secondary | ICD-10-CM

## 2017-04-11 DIAGNOSIS — G609 Hereditary and idiopathic neuropathy, unspecified: Secondary | ICD-10-CM | POA: Diagnosis not present

## 2017-04-11 DIAGNOSIS — F4323 Adjustment disorder with mixed anxiety and depressed mood: Secondary | ICD-10-CM | POA: Diagnosis not present

## 2017-04-11 DIAGNOSIS — M545 Low back pain: Secondary | ICD-10-CM | POA: Diagnosis not present

## 2017-04-11 DIAGNOSIS — M797 Fibromyalgia: Secondary | ICD-10-CM | POA: Diagnosis not present

## 2017-04-15 DIAGNOSIS — M546 Pain in thoracic spine: Secondary | ICD-10-CM | POA: Diagnosis not present

## 2017-04-18 ENCOUNTER — Ambulatory Visit (INDEPENDENT_AMBULATORY_CARE_PROVIDER_SITE_OTHER): Payer: BLUE CROSS/BLUE SHIELD | Admitting: Psychology

## 2017-04-18 DIAGNOSIS — G609 Hereditary and idiopathic neuropathy, unspecified: Secondary | ICD-10-CM | POA: Diagnosis not present

## 2017-04-18 DIAGNOSIS — F320 Major depressive disorder, single episode, mild: Secondary | ICD-10-CM | POA: Diagnosis not present

## 2017-04-18 DIAGNOSIS — F4323 Adjustment disorder with mixed anxiety and depressed mood: Secondary | ICD-10-CM | POA: Diagnosis not present

## 2017-04-18 DIAGNOSIS — M545 Low back pain: Secondary | ICD-10-CM | POA: Diagnosis not present

## 2017-04-18 DIAGNOSIS — M797 Fibromyalgia: Secondary | ICD-10-CM | POA: Diagnosis not present

## 2017-04-20 DIAGNOSIS — L910 Hypertrophic scar: Secondary | ICD-10-CM | POA: Diagnosis not present

## 2017-04-20 DIAGNOSIS — L309 Dermatitis, unspecified: Secondary | ICD-10-CM | POA: Diagnosis not present

## 2017-04-20 DIAGNOSIS — G609 Hereditary and idiopathic neuropathy, unspecified: Secondary | ICD-10-CM | POA: Diagnosis not present

## 2017-04-20 DIAGNOSIS — M797 Fibromyalgia: Secondary | ICD-10-CM | POA: Diagnosis not present

## 2017-04-25 DIAGNOSIS — G609 Hereditary and idiopathic neuropathy, unspecified: Secondary | ICD-10-CM | POA: Diagnosis not present

## 2017-04-25 DIAGNOSIS — M545 Low back pain: Secondary | ICD-10-CM | POA: Diagnosis not present

## 2017-04-25 DIAGNOSIS — M797 Fibromyalgia: Secondary | ICD-10-CM | POA: Diagnosis not present

## 2017-04-25 DIAGNOSIS — F4323 Adjustment disorder with mixed anxiety and depressed mood: Secondary | ICD-10-CM | POA: Diagnosis not present

## 2017-04-26 DIAGNOSIS — L309 Dermatitis, unspecified: Secondary | ICD-10-CM | POA: Diagnosis not present

## 2017-05-02 ENCOUNTER — Ambulatory Visit: Payer: BLUE CROSS/BLUE SHIELD | Admitting: Psychology

## 2017-05-02 DIAGNOSIS — F4323 Adjustment disorder with mixed anxiety and depressed mood: Secondary | ICD-10-CM | POA: Diagnosis not present

## 2017-05-02 DIAGNOSIS — G609 Hereditary and idiopathic neuropathy, unspecified: Secondary | ICD-10-CM | POA: Diagnosis not present

## 2017-05-02 DIAGNOSIS — M797 Fibromyalgia: Secondary | ICD-10-CM | POA: Diagnosis not present

## 2017-05-02 DIAGNOSIS — M545 Low back pain: Secondary | ICD-10-CM | POA: Diagnosis not present

## 2017-05-03 DIAGNOSIS — F4323 Adjustment disorder with mixed anxiety and depressed mood: Secondary | ICD-10-CM | POA: Diagnosis not present

## 2017-05-09 DIAGNOSIS — F4323 Adjustment disorder with mixed anxiety and depressed mood: Secondary | ICD-10-CM | POA: Diagnosis not present

## 2017-05-09 DIAGNOSIS — G609 Hereditary and idiopathic neuropathy, unspecified: Secondary | ICD-10-CM | POA: Diagnosis not present

## 2017-05-09 DIAGNOSIS — M6281 Muscle weakness (generalized): Secondary | ICD-10-CM | POA: Diagnosis not present

## 2017-05-09 DIAGNOSIS — M21962 Unspecified acquired deformity of left lower leg: Secondary | ICD-10-CM | POA: Diagnosis not present

## 2017-05-09 DIAGNOSIS — M21961 Unspecified acquired deformity of right lower leg: Secondary | ICD-10-CM | POA: Diagnosis not present

## 2017-05-09 DIAGNOSIS — M205X2 Other deformities of toe(s) (acquired), left foot: Secondary | ICD-10-CM | POA: Diagnosis not present

## 2017-05-10 DIAGNOSIS — G609 Hereditary and idiopathic neuropathy, unspecified: Secondary | ICD-10-CM | POA: Diagnosis not present

## 2017-05-10 DIAGNOSIS — M545 Low back pain: Secondary | ICD-10-CM | POA: Diagnosis not present

## 2017-05-10 DIAGNOSIS — F4323 Adjustment disorder with mixed anxiety and depressed mood: Secondary | ICD-10-CM | POA: Diagnosis not present

## 2017-05-10 DIAGNOSIS — M797 Fibromyalgia: Secondary | ICD-10-CM | POA: Diagnosis not present

## 2017-05-16 DIAGNOSIS — F4323 Adjustment disorder with mixed anxiety and depressed mood: Secondary | ICD-10-CM | POA: Diagnosis not present

## 2017-05-16 DIAGNOSIS — M797 Fibromyalgia: Secondary | ICD-10-CM | POA: Diagnosis not present

## 2017-05-16 DIAGNOSIS — M545 Low back pain: Secondary | ICD-10-CM | POA: Diagnosis not present

## 2017-05-16 DIAGNOSIS — G609 Hereditary and idiopathic neuropathy, unspecified: Secondary | ICD-10-CM | POA: Diagnosis not present

## 2017-05-17 DIAGNOSIS — F4323 Adjustment disorder with mixed anxiety and depressed mood: Secondary | ICD-10-CM | POA: Diagnosis not present

## 2017-05-18 ENCOUNTER — Ambulatory Visit (INDEPENDENT_AMBULATORY_CARE_PROVIDER_SITE_OTHER): Payer: BLUE CROSS/BLUE SHIELD

## 2017-05-18 ENCOUNTER — Encounter: Payer: Self-pay | Admitting: Podiatry

## 2017-05-18 ENCOUNTER — Ambulatory Visit: Payer: BLUE CROSS/BLUE SHIELD | Admitting: Podiatry

## 2017-05-18 ENCOUNTER — Other Ambulatory Visit: Payer: Self-pay | Admitting: Podiatry

## 2017-05-18 VITALS — BP 103/59 | HR 80

## 2017-05-18 DIAGNOSIS — M79674 Pain in right toe(s): Secondary | ICD-10-CM

## 2017-05-18 DIAGNOSIS — G629 Polyneuropathy, unspecified: Secondary | ICD-10-CM

## 2017-05-18 DIAGNOSIS — M79675 Pain in left toe(s): Secondary | ICD-10-CM | POA: Diagnosis not present

## 2017-05-18 NOTE — Progress Notes (Signed)
Subjective:   Patient ID: Joyce Cortez, female   DOB: 37 y.o.   MRN: [id number]   HPI Patient presents stating that she gets an electric feeling in her feet and she is being seen by a neurologist and she is being seen by rheumatologist but she want to see if we had any other ideas.  Patient does not smoke likes to be active   Review of Systems  All other systems reviewed and are negative.       Objective:  Physical Exam  Constitutional: She appears well-developed and well-nourished.  Cardiovascular: Intact distal pulses.  Pulmonary/Chest: Effort normal.  Musculoskeletal: Normal range of motion.  Neurological: She is alert.  Skin: Skin is warm.  Nursing note and vitals reviewed.   Neurovascular status was found to be intact muscle strength was adequate and I was unable to ascertain any areas of pain with palpation.  Patient is on a walker and was in a wheelchair for a year with what she stated was forefoot pain bilateral but no one is been able to figure anything out and she is had numerous lab tests done numerous nerve conduction studies with no pathology that indicated with patient given tentative diagnosis of fibromyalgia     Assessment:  Cannot judge on this patient what possibly could be causing this but will get x-rays to rule out pathology     Plan:  H&P x-rays reviewed and at this point I do not have anything else to add for her over what she is doing but I did encourage her to try to be active and continue physical therapy  X-rays did not indicate any bone injury any kind of arthritis or other bone pathology

## 2017-05-19 DIAGNOSIS — F4323 Adjustment disorder with mixed anxiety and depressed mood: Secondary | ICD-10-CM | POA: Diagnosis not present

## 2017-05-19 DIAGNOSIS — G609 Hereditary and idiopathic neuropathy, unspecified: Secondary | ICD-10-CM | POA: Diagnosis not present

## 2017-05-19 DIAGNOSIS — M545 Low back pain: Secondary | ICD-10-CM | POA: Diagnosis not present

## 2017-05-19 DIAGNOSIS — M797 Fibromyalgia: Secondary | ICD-10-CM | POA: Diagnosis not present

## 2017-05-23 DIAGNOSIS — M797 Fibromyalgia: Secondary | ICD-10-CM | POA: Diagnosis not present

## 2017-05-23 DIAGNOSIS — G609 Hereditary and idiopathic neuropathy, unspecified: Secondary | ICD-10-CM | POA: Diagnosis not present

## 2017-05-23 DIAGNOSIS — F4323 Adjustment disorder with mixed anxiety and depressed mood: Secondary | ICD-10-CM | POA: Diagnosis not present

## 2017-05-23 DIAGNOSIS — M545 Low back pain: Secondary | ICD-10-CM | POA: Diagnosis not present

## 2017-05-30 DIAGNOSIS — F4323 Adjustment disorder with mixed anxiety and depressed mood: Secondary | ICD-10-CM | POA: Diagnosis not present

## 2017-06-06 DIAGNOSIS — F4323 Adjustment disorder with mixed anxiety and depressed mood: Secondary | ICD-10-CM | POA: Diagnosis not present

## 2017-06-08 DIAGNOSIS — G609 Hereditary and idiopathic neuropathy, unspecified: Secondary | ICD-10-CM | POA: Diagnosis not present

## 2017-06-16 ENCOUNTER — Telehealth: Payer: Self-pay | Admitting: Family Medicine

## 2017-06-16 DIAGNOSIS — S20422A Blister (nonthermal) of left back wall of thorax, initial encounter: Secondary | ICD-10-CM | POA: Diagnosis not present

## 2017-06-16 DIAGNOSIS — R1084 Generalized abdominal pain: Secondary | ICD-10-CM

## 2017-06-16 NOTE — Telephone Encounter (Signed)
Copied from CRM [id number]. Topic: General - Other >> Jun 16, 2017  2:01 PM Windy Kalata, NT wrote: Reason for CRM: patient is calling and states that she is having gastro issues. She states her stomach has been uneasy for the past couple of days. She states whether she eats or doesn't eat that her stomach feels like she has not ate at all. She states she does not have a appetite. She states she is taking probiotic 2x a times and is using the bathroom well. She would like to know does she need to come in and see Dr. Clent Ridges or be referred to GI. Please advise.

## 2017-06-17 NOTE — Telephone Encounter (Signed)
Sent to PCP to advise 

## 2017-06-20 NOTE — Telephone Encounter (Signed)
Called pt and left a detailed VM that the referall was placed and that they should be getting a call from the GI office or our office with appt date and time within 1-2 weeks. Advised pt to call back if they have not heard from anyone within that time frame.

## 2017-06-20 NOTE — Telephone Encounter (Signed)
I went ahead and did a referral to GI

## 2017-06-21 DIAGNOSIS — F4323 Adjustment disorder with mixed anxiety and depressed mood: Secondary | ICD-10-CM | POA: Diagnosis not present

## 2017-06-24 DIAGNOSIS — G609 Hereditary and idiopathic neuropathy, unspecified: Secondary | ICD-10-CM | POA: Diagnosis not present

## 2017-06-24 DIAGNOSIS — F4323 Adjustment disorder with mixed anxiety and depressed mood: Secondary | ICD-10-CM | POA: Diagnosis not present

## 2017-06-24 DIAGNOSIS — M797 Fibromyalgia: Secondary | ICD-10-CM | POA: Diagnosis not present

## 2017-06-24 DIAGNOSIS — M545 Low back pain: Secondary | ICD-10-CM | POA: Diagnosis not present

## 2017-06-27 DIAGNOSIS — F4323 Adjustment disorder with mixed anxiety and depressed mood: Secondary | ICD-10-CM | POA: Diagnosis not present

## 2017-06-29 DIAGNOSIS — M545 Low back pain: Secondary | ICD-10-CM | POA: Diagnosis not present

## 2017-06-29 DIAGNOSIS — F4323 Adjustment disorder with mixed anxiety and depressed mood: Secondary | ICD-10-CM | POA: Diagnosis not present

## 2017-06-29 DIAGNOSIS — M797 Fibromyalgia: Secondary | ICD-10-CM | POA: Diagnosis not present

## 2017-06-29 DIAGNOSIS — G609 Hereditary and idiopathic neuropathy, unspecified: Secondary | ICD-10-CM | POA: Diagnosis not present

## 2017-07-04 DIAGNOSIS — F4323 Adjustment disorder with mixed anxiety and depressed mood: Secondary | ICD-10-CM | POA: Diagnosis not present

## 2017-07-04 DIAGNOSIS — R079 Chest pain, unspecified: Secondary | ICD-10-CM | POA: Diagnosis not present

## 2017-07-04 DIAGNOSIS — M25512 Pain in left shoulder: Secondary | ICD-10-CM | POA: Diagnosis not present

## 2017-07-05 DIAGNOSIS — R350 Frequency of micturition: Secondary | ICD-10-CM | POA: Diagnosis not present

## 2017-07-05 DIAGNOSIS — N760 Acute vaginitis: Secondary | ICD-10-CM | POA: Diagnosis not present

## 2017-07-11 DIAGNOSIS — F4323 Adjustment disorder with mixed anxiety and depressed mood: Secondary | ICD-10-CM | POA: Diagnosis not present

## 2017-07-14 DIAGNOSIS — Z113 Encounter for screening for infections with a predominantly sexual mode of transmission: Secondary | ICD-10-CM | POA: Diagnosis not present

## 2017-07-14 DIAGNOSIS — B373 Candidiasis of vulva and vagina: Secondary | ICD-10-CM | POA: Diagnosis not present

## 2017-07-14 DIAGNOSIS — G609 Hereditary and idiopathic neuropathy, unspecified: Secondary | ICD-10-CM | POA: Diagnosis not present

## 2017-07-14 DIAGNOSIS — Z1159 Encounter for screening for other viral diseases: Secondary | ICD-10-CM | POA: Diagnosis not present

## 2017-07-14 DIAGNOSIS — Z118 Encounter for screening for other infectious and parasitic diseases: Secondary | ICD-10-CM | POA: Diagnosis not present

## 2017-07-14 DIAGNOSIS — F4323 Adjustment disorder with mixed anxiety and depressed mood: Secondary | ICD-10-CM | POA: Diagnosis not present

## 2017-07-14 DIAGNOSIS — M797 Fibromyalgia: Secondary | ICD-10-CM | POA: Diagnosis not present

## 2017-07-14 DIAGNOSIS — Z114 Encounter for screening for human immunodeficiency virus [HIV]: Secondary | ICD-10-CM | POA: Diagnosis not present

## 2017-07-14 DIAGNOSIS — M545 Low back pain: Secondary | ICD-10-CM | POA: Diagnosis not present

## 2017-07-25 DIAGNOSIS — F4323 Adjustment disorder with mixed anxiety and depressed mood: Secondary | ICD-10-CM | POA: Diagnosis not present

## 2017-07-26 DIAGNOSIS — M545 Low back pain: Secondary | ICD-10-CM | POA: Diagnosis not present

## 2017-07-26 DIAGNOSIS — F4323 Adjustment disorder with mixed anxiety and depressed mood: Secondary | ICD-10-CM | POA: Diagnosis not present

## 2017-07-26 DIAGNOSIS — G609 Hereditary and idiopathic neuropathy, unspecified: Secondary | ICD-10-CM | POA: Diagnosis not present

## 2017-07-26 DIAGNOSIS — M797 Fibromyalgia: Secondary | ICD-10-CM | POA: Diagnosis not present

## 2017-08-01 DIAGNOSIS — F4323 Adjustment disorder with mixed anxiety and depressed mood: Secondary | ICD-10-CM | POA: Diagnosis not present

## 2017-08-03 DIAGNOSIS — Z6825 Body mass index (BMI) 25.0-25.9, adult: Secondary | ICD-10-CM | POA: Diagnosis not present

## 2017-08-03 DIAGNOSIS — N898 Other specified noninflammatory disorders of vagina: Secondary | ICD-10-CM | POA: Diagnosis not present

## 2017-08-03 DIAGNOSIS — Z01419 Encounter for gynecological examination (general) (routine) without abnormal findings: Secondary | ICD-10-CM | POA: Diagnosis not present

## 2017-08-03 DIAGNOSIS — R300 Dysuria: Secondary | ICD-10-CM | POA: Diagnosis not present

## 2017-08-05 ENCOUNTER — Ambulatory Visit: Payer: Self-pay | Admitting: Family Medicine

## 2017-08-05 NOTE — Telephone Encounter (Signed)
Pt called with symptoms she believes are side effects from medicines prescribed by her gynecologist. Pt stated she was prescribed; sulfamethoxazole-TMP-DS and Fluconazole. She stated after 2 days of taking meds she is having blurred vision with migraine(pt has h/o migraines) rash to right thigh and left knee, nauseated and red rash to right thigh and left knee. Pt has a fever to 101.6. Pt advised to take acetaminophen or ibuprofen for the headache and body aches. Pt has a h/o fibromyalgia. Pt asked to take 1-2 doses of benadryl for the rash. Pt stated that she was prescribed the medicine for vaginal infection. Pt stated that she called Dr Billy Coastaavon and he discontinued it. Pt was wanting to know what she could take for her infection.  Offered a Saturday appointment but pt declined. Pt stated that she will go to a urgent care for the vaginal infection. Pt encourage to drink water to try and get the medication out of her system. Pt states she is drinking and is able to keep the fluids down. Pt stated that she took a warm shower and that helped with the body aches and migraine.  Answer Assessment - Initial Assessment Questions 1. SYMPTOMS: "Do you have any symptoms?"     Fever to 101.6, SOB. Dizzy migraine rash right thigh left knee, nausea, stomach hot and cold chills vision blurred  2. SEVERITY: If symptoms are present, ask "Are they mild, moderate or severe?"     Mild to moderate  Protocols used: MEDICATION QUESTION CALL-A-AH

## 2017-08-06 DIAGNOSIS — T7840XA Allergy, unspecified, initial encounter: Secondary | ICD-10-CM | POA: Diagnosis not present

## 2017-08-06 DIAGNOSIS — N390 Urinary tract infection, site not specified: Secondary | ICD-10-CM | POA: Diagnosis not present

## 2017-08-08 DIAGNOSIS — F4323 Adjustment disorder with mixed anxiety and depressed mood: Secondary | ICD-10-CM | POA: Diagnosis not present

## 2017-08-09 ENCOUNTER — Ambulatory Visit: Payer: BLUE CROSS/BLUE SHIELD | Admitting: Family Medicine

## 2017-08-09 ENCOUNTER — Encounter: Payer: Self-pay | Admitting: Family Medicine

## 2017-08-09 VITALS — BP 106/78 | HR 80 | Temp 98.8°F | Wt 139.0 lb

## 2017-08-09 DIAGNOSIS — T7840XA Allergy, unspecified, initial encounter: Secondary | ICD-10-CM

## 2017-08-09 NOTE — Progress Notes (Signed)
Subjective:    Patient ID: Joyce HuxleyStefanie Cortez, female    DOB: 11/14/1980, 37 y.o.   MRN: [id number]  No chief complaint on file.   HPI Patient was seen today for follow-up on acute concern.  Pt recently given Bactrim and Diflucan by OB/GYN for yeast infection and suspected UTI.  Pt took the medis on Wednesday at 9 PM and woke up Thursday am with a migraine.  Pt  developed a rash on her legs, blurred vision, fever 101.6.  Pt is unsure how high her fever actually was as she was taking ibuprofen prior to checking.  Pt states she stopped taking the bactrim and is feeling better but thinks the rxn caused a fibromyalgia flare. Pt told she had increased tension at her dry needling appt this wk.  Pt has also been taking Benadryl.  Notes sore throat.  Past Medical History:  Diagnosis Date  . ADHD (attention deficit hyperactivity disorder)   . Allergy   . Asthma   . Fibromyalgia     Allergies  Allergen Reactions  . Sulfa Antibiotics     RASH, FEVER AND HEADACHE   . Codeine Nausea Only  . Doxycycline Nausea And Vomiting  . Pregabalin Other (See Comments)    ROS General: Denies chills, night sweats, changes in weight, changes in appetite  +fever HEENT: Denies ear pain, changes in vision, rhinorrhea   + migraine, sore throat, blurred vision CV: Denies CP, palpitations, SOB, orthopnea Pulm: Denies SOB, cough, wheezing GI: Denies abdominal pain, nausea, vomiting, diarrhea, constipation GU: Denies dysuria, hematuria, frequency, vaginal discharge Msk: Denies muscle cramps, joint pains Neuro: Denies weakness, numbness, tingling Skin: Denies bruising  +rash on legs Psych: Denies depression, anxiety, hallucinations     Objective:    Blood pressure 106/78, pulse 80, temperature 98.8 F (37.1 C), temperature source Oral, weight 139 lb (63 kg), last menstrual period 07/16/2017, SpO2 96 %.   Gen. Pleasant, well-nourished, in no distress, normal affect   HEENT: Wearing glasses, [place]/AT, face  symmetric, no scleral icterus, PERRLA, nares patent without drainage, pharynx without erythema or exudate. Lungs: no accessory muscle use, CTAB, no wheezes or rales Cardiovascular: RRR, no m/r/g, no peripheral edema Neuro:  A&Ox3, CN II-XII intact, normal gait Skin:  Warm, dry, intact.  Improving rash on bilateral lower extremities.  Mildly erythematous papules (viewed pictures of initial rash on pt's phone- lesions less erythematous and raised than before)   Wt Readings from Last 3 Encounters:  08/09/17 139 lb (63 kg)  12/08/16 164 lb 9.6 oz (74.7 kg)  11/12/16 163 lb (73.9 kg)    Lab Results  Component Value Date   WBC 6.1 07/02/2016   HGB 13.6 07/02/2016   HCT 38.9 07/02/2016   PLT 176 07/02/2016   GLUCOSE 93 07/02/2016   ALT 16 07/02/2016   AST 18 07/02/2016   NA 139 07/02/2016   K 3.7 07/02/2016   CL 104 07/02/2016   CREATININE 0.64 07/02/2016   BUN 9 07/02/2016   CO2 26 07/02/2016   TSH 1.04 02/23/2016    Assessment/Plan:  Allergic reaction to drug, initial encounter  -improving -Sulfa added to pt's allergy list -pt encouraged to continue drinking plenty of fluids -can gargle with warm salt water for sore throat. -ok to use benadryl prn -reassured that rash is improving.   F/u prn for worsening or continued symptoms.  Abbe AmsterdamShannon Banks, MD

## 2017-08-15 DIAGNOSIS — F4323 Adjustment disorder with mixed anxiety and depressed mood: Secondary | ICD-10-CM | POA: Diagnosis not present

## 2017-08-17 DIAGNOSIS — F4323 Adjustment disorder with mixed anxiety and depressed mood: Secondary | ICD-10-CM | POA: Diagnosis not present

## 2017-08-17 DIAGNOSIS — M797 Fibromyalgia: Secondary | ICD-10-CM | POA: Diagnosis not present

## 2017-08-17 DIAGNOSIS — G609 Hereditary and idiopathic neuropathy, unspecified: Secondary | ICD-10-CM | POA: Diagnosis not present

## 2017-08-17 DIAGNOSIS — M545 Low back pain: Secondary | ICD-10-CM | POA: Diagnosis not present

## 2017-08-22 ENCOUNTER — Ambulatory Visit: Payer: BLUE CROSS/BLUE SHIELD | Admitting: Family Medicine

## 2017-08-22 ENCOUNTER — Encounter: Payer: Self-pay | Admitting: Family Medicine

## 2017-08-22 VITALS — BP 122/68 | HR 83 | Temp 98.3°F | Ht 64.0 in | Wt 141.4 lb

## 2017-08-22 DIAGNOSIS — R739 Hyperglycemia, unspecified: Secondary | ICD-10-CM | POA: Diagnosis not present

## 2017-08-22 DIAGNOSIS — Z0000 Encounter for general adult medical examination without abnormal findings: Secondary | ICD-10-CM

## 2017-08-22 DIAGNOSIS — R5383 Other fatigue: Secondary | ICD-10-CM | POA: Diagnosis not present

## 2017-08-22 LAB — POCT URINALYSIS DIPSTICK
Bilirubin, UA: NEGATIVE
GLUCOSE UA: NEGATIVE
Ketones, UA: NEGATIVE
LEUKOCYTES UA: NEGATIVE
Nitrite, UA: NEGATIVE
Protein, UA: NEGATIVE
RBC UA: NEGATIVE
Spec Grav, UA: <=1.005 — AB (ref 1.010–1.025)
Urobilinogen, UA: 0.2 E.U./dL
pH, UA: 6 (ref 5.0–8.0)

## 2017-08-22 LAB — POCT URINE PREGNANCY: PREG TEST UR: NEGATIVE

## 2017-08-22 NOTE — Progress Notes (Signed)
   Subjective:    Patient ID: Lois HuxleyStefanie Teague, female    DOB: 04/23/1980, 37 y.o.   MRN: [id number]  HPI Here asking to be checked for diabetes and other possible problems that can cause fatigue. She has days where she feels fairly well but on other days she is extremely fatigued. She tries to get good nutrition. Her weight is stable.    Review of Systems  Constitutional: Positive for fatigue.  Respiratory: Negative.   Cardiovascular: Negative.   Gastrointestinal: Negative.   Genitourinary: Negative.   Neurological: Positive for weakness.       Objective:   Physical Exam  Constitutional: She appears well-developed and well-nourished.  She walks unassisted   Neck: No thyromegaly present.  Cardiovascular: Normal rate, regular rhythm, normal heart sounds and intact distal pulses.  Pulmonary/Chest: Effort normal and breath sounds normal. No stridor. No respiratory distress. She has no wheezes. She has no rales.  Musculoskeletal: She exhibits no edema.  Neurological: She exhibits normal muscle tone. Coordination normal.          Assessment & Plan:  She complains of fatigue and I suspect her fibromyalgia may be playing a role. Get labs today to investigate.  Gershon CraneStephen Marjoria Mancillas, MD

## 2017-08-23 DIAGNOSIS — F4323 Adjustment disorder with mixed anxiety and depressed mood: Secondary | ICD-10-CM | POA: Diagnosis not present

## 2017-08-23 DIAGNOSIS — G609 Hereditary and idiopathic neuropathy, unspecified: Secondary | ICD-10-CM | POA: Diagnosis not present

## 2017-08-23 DIAGNOSIS — M797 Fibromyalgia: Secondary | ICD-10-CM | POA: Diagnosis not present

## 2017-08-23 DIAGNOSIS — M545 Low back pain: Secondary | ICD-10-CM | POA: Diagnosis not present

## 2017-08-23 LAB — CBC WITH DIFFERENTIAL/PLATELET
BASOS ABS: 0.1 10*3/uL (ref 0.0–0.1)
Basophils Relative: 1 % (ref 0.0–3.0)
Eosinophils Absolute: 0 10*3/uL (ref 0.0–0.7)
Eosinophils Relative: 0.4 % (ref 0.0–5.0)
HEMATOCRIT: 38.3 % (ref 36.0–46.0)
Hemoglobin: 12.8 g/dL (ref 12.0–15.0)
LYMPHS PCT: 33.6 % (ref 12.0–46.0)
Lymphs Abs: 1.8 10*3/uL (ref 0.7–4.0)
MCHC: 33.5 g/dL (ref 30.0–36.0)
MCV: 94.8 fl (ref 78.0–100.0)
MONOS PCT: 6.2 % (ref 3.0–12.0)
Monocytes Absolute: 0.3 10*3/uL (ref 0.1–1.0)
Neutro Abs: 3.1 10*3/uL (ref 1.4–7.7)
Neutrophils Relative %: 58.8 % (ref 43.0–77.0)
Platelets: 230 10*3/uL (ref 150.0–400.0)
RBC: 4.04 Mil/uL (ref 3.87–5.11)
RDW: 13.3 % (ref 11.5–15.5)
WBC: 5.2 10*3/uL (ref 4.0–10.5)

## 2017-08-23 LAB — LIPID PANEL
CHOL/HDL RATIO: 3
Cholesterol: 187 mg/dL (ref 0–200)
HDL: 59.6 mg/dL (ref >39.00)
LDL CALC: 117 mg/dL — AB (ref 0–99)
NonHDL: 127.55
TRIGLYCERIDES: 53 mg/dL (ref 0.0–149.0)
VLDL: 10.6 mg/dL (ref 0.0–40.0)

## 2017-08-23 LAB — BASIC METABOLIC PANEL
BUN: 10 mg/dL (ref 6–23)
CO2: 30 mEq/L (ref 19–32)
CREATININE: 0.75 mg/dL (ref 0.40–1.20)
Calcium: 9.9 mg/dL (ref 8.4–10.5)
Chloride: 103 mEq/L (ref 96–112)
GFR: 93.5 mL/min (ref >60.00)
Glucose, Bld: 98 mg/dL (ref 70–99)
POTASSIUM: 3.8 meq/L (ref 3.5–5.1)
Sodium: 140 mEq/L (ref 135–145)

## 2017-08-23 LAB — HEPATIC FUNCTION PANEL
ALT: 7 U/L (ref 0–35)
AST: 11 U/L (ref 0–37)
Albumin: 4.7 g/dL (ref 3.5–5.2)
Alkaline Phosphatase: 45 U/L (ref 39–117)
BILIRUBIN TOTAL: 0.8 mg/dL (ref 0.2–1.2)
Bilirubin, Direct: 0.2 mg/dL (ref 0.0–0.3)
Total Protein: 7.1 g/dL (ref 6.0–8.3)

## 2017-08-23 LAB — VITAMIN B12: Vitamin B-12: 256 pg/mL (ref 211–911)

## 2017-08-23 LAB — HEMOGLOBIN A1C: HEMOGLOBIN A1C: 5.4 % (ref 4.6–6.5)

## 2017-08-23 LAB — TSH: TSH: 1.12 u[IU]/mL (ref 0.35–4.50)

## 2017-08-24 ENCOUNTER — Encounter: Payer: Self-pay | Admitting: Family Medicine

## 2017-08-24 DIAGNOSIS — Z118 Encounter for screening for other infectious and parasitic diseases: Secondary | ICD-10-CM | POA: Diagnosis not present

## 2017-08-24 DIAGNOSIS — Z113 Encounter for screening for infections with a predominantly sexual mode of transmission: Secondary | ICD-10-CM | POA: Diagnosis not present

## 2017-08-24 DIAGNOSIS — Z1159 Encounter for screening for other viral diseases: Secondary | ICD-10-CM | POA: Diagnosis not present

## 2017-08-24 DIAGNOSIS — R350 Frequency of micturition: Secondary | ICD-10-CM | POA: Diagnosis not present

## 2017-08-24 DIAGNOSIS — Z30431 Encounter for routine checking of intrauterine contraceptive device: Secondary | ICD-10-CM | POA: Diagnosis not present

## 2017-08-24 DIAGNOSIS — Z114 Encounter for screening for human immunodeficiency virus [HIV]: Secondary | ICD-10-CM | POA: Diagnosis not present

## 2017-08-24 DIAGNOSIS — B373 Candidiasis of vulva and vagina: Secondary | ICD-10-CM | POA: Diagnosis not present

## 2017-08-30 DIAGNOSIS — F4323 Adjustment disorder with mixed anxiety and depressed mood: Secondary | ICD-10-CM | POA: Diagnosis not present

## 2017-08-31 DIAGNOSIS — M797 Fibromyalgia: Secondary | ICD-10-CM | POA: Diagnosis not present

## 2017-08-31 DIAGNOSIS — M545 Low back pain: Secondary | ICD-10-CM | POA: Diagnosis not present

## 2017-08-31 DIAGNOSIS — G609 Hereditary and idiopathic neuropathy, unspecified: Secondary | ICD-10-CM | POA: Diagnosis not present

## 2017-08-31 DIAGNOSIS — F4323 Adjustment disorder with mixed anxiety and depressed mood: Secondary | ICD-10-CM | POA: Diagnosis not present

## 2017-09-06 DIAGNOSIS — F4323 Adjustment disorder with mixed anxiety and depressed mood: Secondary | ICD-10-CM | POA: Diagnosis not present

## 2017-09-07 ENCOUNTER — Telehealth: Payer: Self-pay | Admitting: Family Medicine

## 2017-09-07 NOTE — Telephone Encounter (Signed)
Placed in Dr. Fry's RED folder  

## 2017-09-07 NOTE — Telephone Encounter (Signed)
Patient dropped of a disability placard  Mail the form to the patients address:  [address] [place]  [place] [phone]  Disposition: Dr's folder

## 2017-09-07 NOTE — Telephone Encounter (Signed)
Copied from CRM [id number]. Topic: General - Other >> Sep 07, 2017  3:30 PM Leafy Roobinson, Norma J wrote: Reason for CRM: pt lost handicap form and would like dr fry to fill out another handicap form for permanent disability due to fibromyalgia

## 2017-09-08 NOTE — Telephone Encounter (Signed)
The patient picked up form and would like to have a permanent placard instead of a 6 month placard.   She said that she can try and come back in 6 months to get another placard but would really like for Dr. Clent RidgesFry to change the placard to permanent  Please advise

## 2017-09-08 NOTE — Telephone Encounter (Signed)
The form is ready  

## 2017-09-08 NOTE — Telephone Encounter (Signed)
Called pt and left a VM that form has been completed and placed up front for pick up.

## 2017-09-09 NOTE — Telephone Encounter (Signed)
Dr.Fry stated that he is not making this permanent. He will only make this temporary.

## 2017-09-13 DIAGNOSIS — F4323 Adjustment disorder with mixed anxiety and depressed mood: Secondary | ICD-10-CM | POA: Diagnosis not present

## 2017-09-20 DIAGNOSIS — M797 Fibromyalgia: Secondary | ICD-10-CM | POA: Diagnosis not present

## 2017-09-20 DIAGNOSIS — M545 Low back pain: Secondary | ICD-10-CM | POA: Diagnosis not present

## 2017-09-20 DIAGNOSIS — F4323 Adjustment disorder with mixed anxiety and depressed mood: Secondary | ICD-10-CM | POA: Diagnosis not present

## 2017-09-20 DIAGNOSIS — G609 Hereditary and idiopathic neuropathy, unspecified: Secondary | ICD-10-CM | POA: Diagnosis not present

## 2017-09-26 DIAGNOSIS — F4323 Adjustment disorder with mixed anxiety and depressed mood: Secondary | ICD-10-CM | POA: Diagnosis not present

## 2017-09-29 DIAGNOSIS — M545 Low back pain: Secondary | ICD-10-CM | POA: Diagnosis not present

## 2017-09-29 DIAGNOSIS — M797 Fibromyalgia: Secondary | ICD-10-CM | POA: Diagnosis not present

## 2017-09-29 DIAGNOSIS — G609 Hereditary and idiopathic neuropathy, unspecified: Secondary | ICD-10-CM | POA: Diagnosis not present

## 2017-09-29 DIAGNOSIS — F4323 Adjustment disorder with mixed anxiety and depressed mood: Secondary | ICD-10-CM | POA: Diagnosis not present

## 2017-10-03 DIAGNOSIS — F4323 Adjustment disorder with mixed anxiety and depressed mood: Secondary | ICD-10-CM | POA: Diagnosis not present

## 2017-10-04 DIAGNOSIS — G609 Hereditary and idiopathic neuropathy, unspecified: Secondary | ICD-10-CM | POA: Diagnosis not present

## 2017-10-04 DIAGNOSIS — M545 Low back pain: Secondary | ICD-10-CM | POA: Diagnosis not present

## 2017-10-04 DIAGNOSIS — F4323 Adjustment disorder with mixed anxiety and depressed mood: Secondary | ICD-10-CM | POA: Diagnosis not present

## 2017-10-04 DIAGNOSIS — M797 Fibromyalgia: Secondary | ICD-10-CM | POA: Diagnosis not present

## 2017-10-10 DIAGNOSIS — F4323 Adjustment disorder with mixed anxiety and depressed mood: Secondary | ICD-10-CM | POA: Diagnosis not present

## 2017-10-11 DIAGNOSIS — M545 Low back pain: Secondary | ICD-10-CM | POA: Diagnosis not present

## 2017-10-11 DIAGNOSIS — M797 Fibromyalgia: Secondary | ICD-10-CM | POA: Diagnosis not present

## 2017-10-11 DIAGNOSIS — G609 Hereditary and idiopathic neuropathy, unspecified: Secondary | ICD-10-CM | POA: Diagnosis not present

## 2017-10-11 DIAGNOSIS — F4323 Adjustment disorder with mixed anxiety and depressed mood: Secondary | ICD-10-CM | POA: Diagnosis not present

## 2017-10-12 DIAGNOSIS — M797 Fibromyalgia: Secondary | ICD-10-CM | POA: Diagnosis not present

## 2017-10-12 DIAGNOSIS — G609 Hereditary and idiopathic neuropathy, unspecified: Secondary | ICD-10-CM | POA: Diagnosis not present

## 2017-10-12 DIAGNOSIS — M545 Low back pain: Secondary | ICD-10-CM | POA: Diagnosis not present

## 2017-10-12 DIAGNOSIS — F4323 Adjustment disorder with mixed anxiety and depressed mood: Secondary | ICD-10-CM | POA: Diagnosis not present

## 2017-10-17 DIAGNOSIS — F4323 Adjustment disorder with mixed anxiety and depressed mood: Secondary | ICD-10-CM | POA: Diagnosis not present

## 2017-10-18 DIAGNOSIS — M545 Low back pain: Secondary | ICD-10-CM | POA: Diagnosis not present

## 2017-10-18 DIAGNOSIS — F4323 Adjustment disorder with mixed anxiety and depressed mood: Secondary | ICD-10-CM | POA: Diagnosis not present

## 2017-10-18 DIAGNOSIS — M797 Fibromyalgia: Secondary | ICD-10-CM | POA: Diagnosis not present

## 2017-10-18 DIAGNOSIS — G609 Hereditary and idiopathic neuropathy, unspecified: Secondary | ICD-10-CM | POA: Diagnosis not present

## 2017-10-25 DIAGNOSIS — F4323 Adjustment disorder with mixed anxiety and depressed mood: Secondary | ICD-10-CM | POA: Diagnosis not present

## 2017-10-31 DIAGNOSIS — F4323 Adjustment disorder with mixed anxiety and depressed mood: Secondary | ICD-10-CM | POA: Diagnosis not present

## 2017-11-07 DIAGNOSIS — F4323 Adjustment disorder with mixed anxiety and depressed mood: Secondary | ICD-10-CM | POA: Diagnosis not present

## 2017-11-08 DIAGNOSIS — G609 Hereditary and idiopathic neuropathy, unspecified: Secondary | ICD-10-CM | POA: Diagnosis not present

## 2017-11-08 DIAGNOSIS — M545 Low back pain: Secondary | ICD-10-CM | POA: Diagnosis not present

## 2017-11-08 DIAGNOSIS — M797 Fibromyalgia: Secondary | ICD-10-CM | POA: Diagnosis not present

## 2017-11-08 DIAGNOSIS — F4323 Adjustment disorder with mixed anxiety and depressed mood: Secondary | ICD-10-CM | POA: Diagnosis not present

## 2017-11-14 DIAGNOSIS — F4323 Adjustment disorder with mixed anxiety and depressed mood: Secondary | ICD-10-CM | POA: Diagnosis not present

## 2017-11-15 DIAGNOSIS — F4323 Adjustment disorder with mixed anxiety and depressed mood: Secondary | ICD-10-CM | POA: Diagnosis not present

## 2017-11-15 DIAGNOSIS — G609 Hereditary and idiopathic neuropathy, unspecified: Secondary | ICD-10-CM | POA: Diagnosis not present

## 2017-11-15 DIAGNOSIS — M797 Fibromyalgia: Secondary | ICD-10-CM | POA: Diagnosis not present

## 2017-11-15 DIAGNOSIS — M545 Low back pain: Secondary | ICD-10-CM | POA: Diagnosis not present

## 2017-11-16 ENCOUNTER — Ambulatory Visit: Payer: BLUE CROSS/BLUE SHIELD | Admitting: Family Medicine

## 2017-11-16 ENCOUNTER — Encounter: Payer: Self-pay | Admitting: Family Medicine

## 2017-11-16 VITALS — BP 106/62 | HR 81 | Temp 98.2°F | Wt 141.0 lb

## 2017-11-16 DIAGNOSIS — L659 Nonscarring hair loss, unspecified: Secondary | ICD-10-CM

## 2017-11-16 LAB — CBC WITH DIFFERENTIAL/PLATELET
BASOS PCT: 0.4 % (ref 0.0–3.0)
Basophils Absolute: 0 10*3/uL (ref 0.0–0.1)
Eosinophils Absolute: 0 10*3/uL (ref 0.0–0.7)
Eosinophils Relative: 0.5 % (ref 0.0–5.0)
HCT: 40.8 % (ref 36.0–46.0)
Hemoglobin: 13.7 g/dL (ref 12.0–15.0)
LYMPHS ABS: 1.5 10*3/uL (ref 0.7–4.0)
Lymphocytes Relative: 25.1 % (ref 12.0–46.0)
MCHC: 33.7 g/dL (ref 30.0–36.0)
MCV: 96 fl (ref 78.0–100.0)
MONO ABS: 0.2 10*3/uL (ref 0.1–1.0)
Monocytes Relative: 4 % (ref 3.0–12.0)
NEUTROS ABS: 4.3 10*3/uL (ref 1.4–7.7)
Neutrophils Relative %: 70 % (ref 43.0–77.0)
PLATELETS: 193 10*3/uL (ref 150.0–400.0)
RBC: 4.25 Mil/uL (ref 3.87–5.11)
RDW: 13.3 % (ref 11.5–15.5)
WBC: 6.1 10*3/uL (ref 4.0–10.5)

## 2017-11-16 LAB — TSH: TSH: 1.35 u[IU]/mL (ref 0.35–4.50)

## 2017-11-16 LAB — T4, FREE: Free T4: 0.69 ng/dL (ref 0.60–1.60)

## 2017-11-16 LAB — T3, FREE: T3, Free: 3 pg/mL (ref 2.3–4.2)

## 2017-11-16 NOTE — Progress Notes (Signed)
   Subjective:    Patient ID: Joyce Cortez, female    DOB: [birth year], 37 y.o.   MRN: [id number]  HPI Here for 2 months of losing hair. It shows generalized thinning and is not patchy. She admits to being under a lot of stress lately because she is worried about whether her boyfriend needs to leave the country or not. Otherwise no complaints.    Review of Systems  Constitutional: Negative.   Respiratory: Negative.   Cardiovascular: Negative.   Neurological: Negative.   Psychiatric/Behavioral: Negative for dysphoric mood and sleep disturbance. The patient is nervous/anxious.        Objective:   Physical Exam  Constitutional: She is oriented to person, place, and time. She appears well-developed and well-nourished.  Neck: No thyromegaly present.  Cardiovascular: Normal rate, regular rhythm, normal heart sounds and intact distal pulses.  Pulmonary/Chest: Effort normal and breath sounds normal.  Lymphadenopathy:    She has no cervical adenopathy.  Neurological: She is alert and oriented to person, place, and time.  Skin:  She has obvious thinning of her hair, mostly in the temples           Assessment & Plan:  Hair loss, likely the result of stress. We will check a CBC and a thyroid panel. She will see her dermatologist, Dr. Sharyn Lull, as well.  Gershon Crane, MD

## 2017-11-23 DIAGNOSIS — F4323 Adjustment disorder with mixed anxiety and depressed mood: Secondary | ICD-10-CM | POA: Diagnosis not present

## 2017-11-28 DIAGNOSIS — F4323 Adjustment disorder with mixed anxiety and depressed mood: Secondary | ICD-10-CM | POA: Diagnosis not present

## 2017-11-29 DIAGNOSIS — M545 Low back pain: Secondary | ICD-10-CM | POA: Diagnosis not present

## 2017-11-29 DIAGNOSIS — D219 Benign neoplasm of connective and other soft tissue, unspecified: Secondary | ICD-10-CM | POA: Diagnosis not present

## 2017-11-29 DIAGNOSIS — L409 Psoriasis, unspecified: Secondary | ICD-10-CM | POA: Diagnosis not present

## 2017-11-29 DIAGNOSIS — M797 Fibromyalgia: Secondary | ICD-10-CM | POA: Diagnosis not present

## 2017-11-29 DIAGNOSIS — F4323 Adjustment disorder with mixed anxiety and depressed mood: Secondary | ICD-10-CM | POA: Diagnosis not present

## 2017-11-29 DIAGNOSIS — G609 Hereditary and idiopathic neuropathy, unspecified: Secondary | ICD-10-CM | POA: Diagnosis not present

## 2017-12-01 DIAGNOSIS — N9089 Other specified noninflammatory disorders of vulva and perineum: Secondary | ICD-10-CM | POA: Diagnosis not present

## 2017-12-05 ENCOUNTER — Other Ambulatory Visit: Payer: Self-pay | Admitting: Family Medicine

## 2017-12-05 DIAGNOSIS — J309 Allergic rhinitis, unspecified: Secondary | ICD-10-CM

## 2017-12-06 DIAGNOSIS — M545 Low back pain: Secondary | ICD-10-CM | POA: Diagnosis not present

## 2017-12-06 DIAGNOSIS — F4323 Adjustment disorder with mixed anxiety and depressed mood: Secondary | ICD-10-CM | POA: Diagnosis not present

## 2017-12-06 DIAGNOSIS — M797 Fibromyalgia: Secondary | ICD-10-CM | POA: Diagnosis not present

## 2017-12-06 DIAGNOSIS — G609 Hereditary and idiopathic neuropathy, unspecified: Secondary | ICD-10-CM | POA: Diagnosis not present

## 2017-12-13 DIAGNOSIS — F4323 Adjustment disorder with mixed anxiety and depressed mood: Secondary | ICD-10-CM | POA: Diagnosis not present

## 2017-12-13 DIAGNOSIS — G609 Hereditary and idiopathic neuropathy, unspecified: Secondary | ICD-10-CM | POA: Diagnosis not present

## 2017-12-13 DIAGNOSIS — L739 Follicular disorder, unspecified: Secondary | ICD-10-CM | POA: Diagnosis not present

## 2017-12-13 DIAGNOSIS — M797 Fibromyalgia: Secondary | ICD-10-CM | POA: Diagnosis not present

## 2017-12-13 DIAGNOSIS — M545 Low back pain: Secondary | ICD-10-CM | POA: Diagnosis not present

## 2017-12-21 DIAGNOSIS — G609 Hereditary and idiopathic neuropathy, unspecified: Secondary | ICD-10-CM | POA: Diagnosis not present

## 2017-12-21 DIAGNOSIS — F4323 Adjustment disorder with mixed anxiety and depressed mood: Secondary | ICD-10-CM | POA: Diagnosis not present

## 2017-12-21 DIAGNOSIS — M797 Fibromyalgia: Secondary | ICD-10-CM | POA: Diagnosis not present

## 2017-12-21 DIAGNOSIS — M545 Low back pain: Secondary | ICD-10-CM | POA: Diagnosis not present

## 2018-01-20 DIAGNOSIS — N941 Unspecified dyspareunia: Secondary | ICD-10-CM | POA: Diagnosis not present

## 2018-03-19 IMAGING — CT CT HEAD W/O CM
3 of 5 series · 15 of 47 positions shown, 18 images · non-contrast
Comparison: None.

CLINICAL DATA: Weakness and slow speech. Tingling bilaterally,
onset today at 7699 hours. History of fibromyalgia

EXAM:
CT HEAD WITHOUT CONTRAST
TECHNIQUE: Contiguous axial images were obtained from the base of the skull
through the vertex without intravenous contrast.

[Series 4: coronal · coronal · 0.34mm/px · 3 of 59 slices shown]
[im 20/59  brain]
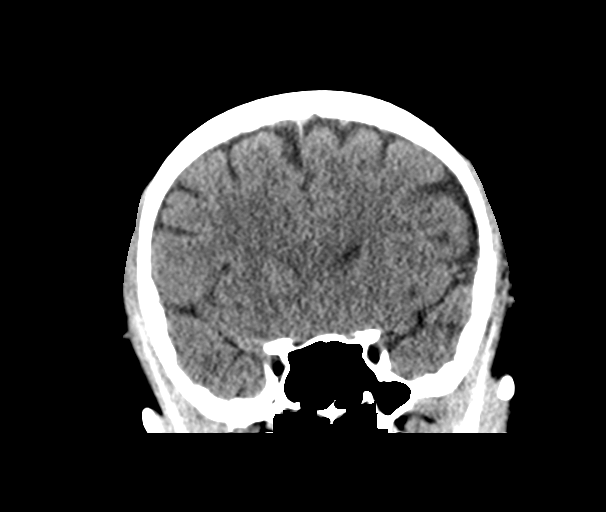
[im 26/59  brain]
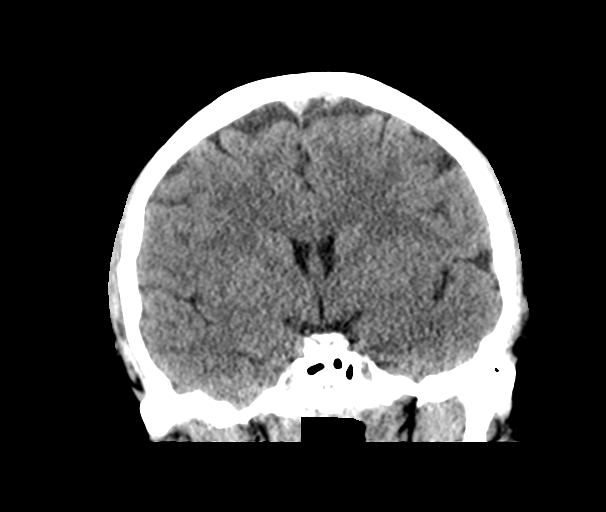
[im 33/59  brain]
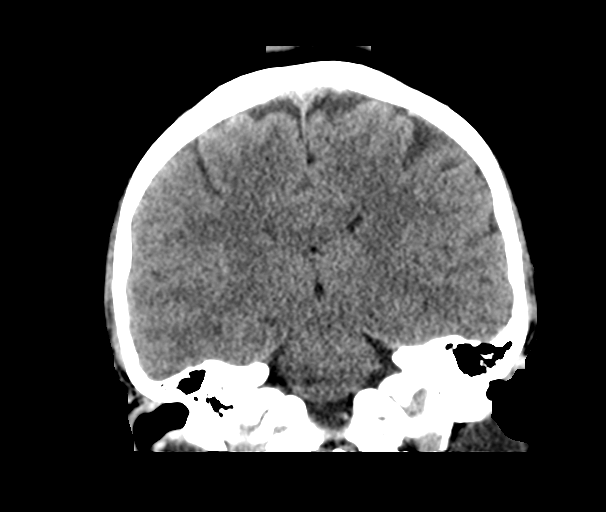

[Series 5: sagittal · sagittal · 0.31mm/px · 3 of 52 slices shown]
[im 18/52  brain]
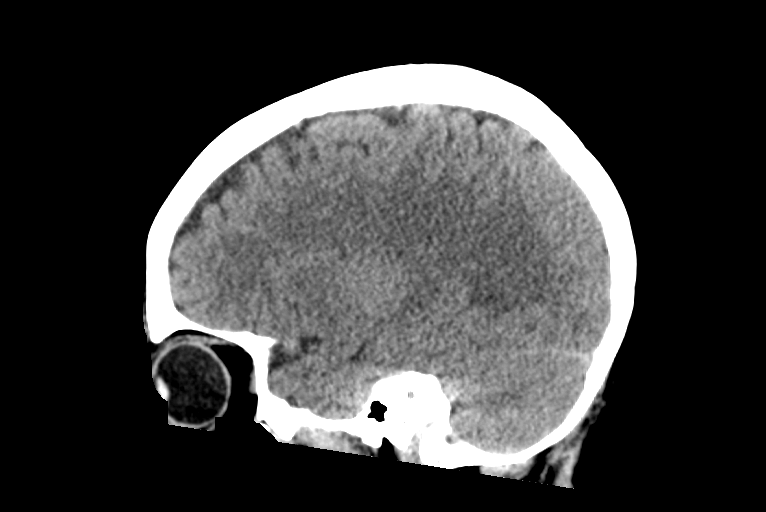
[im 26/52  brain]
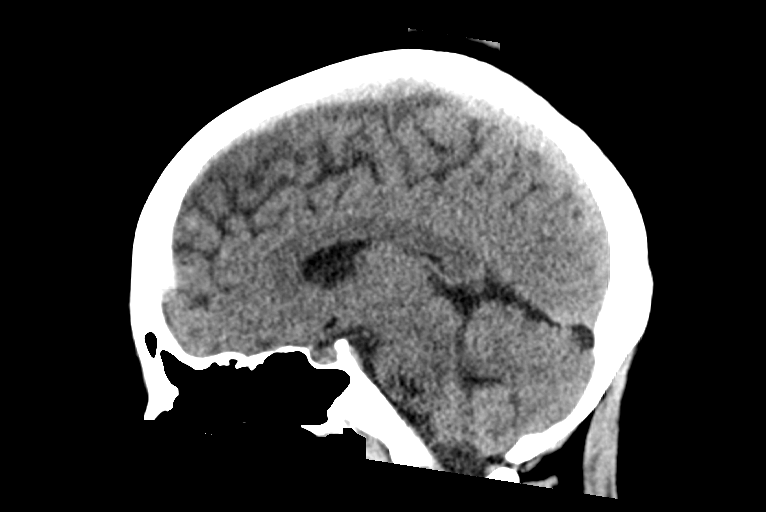
[im 35/52  brain]
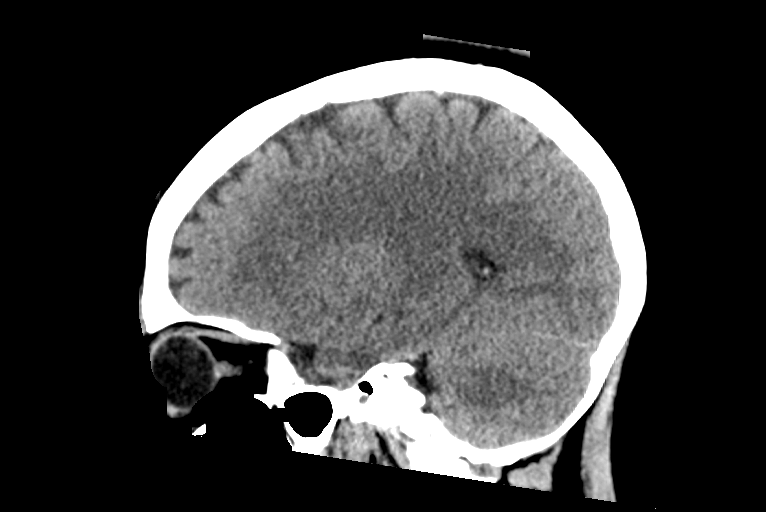

[Series 7: ang.axials · axial · 0.41mm/px · z∈[-208,-97]mm · 9 of 45 slices shown, 12 images]
[im 4/45  brain]
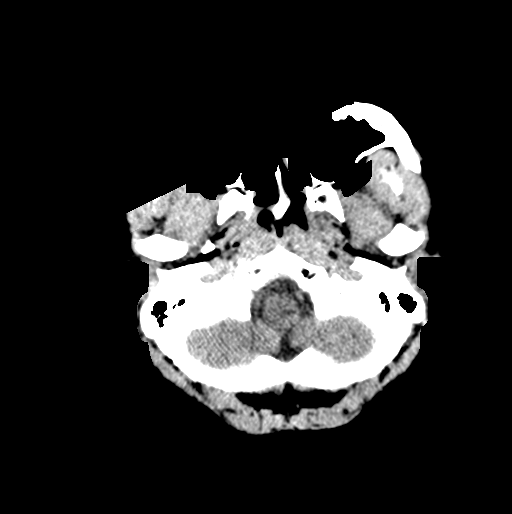
[im 4/45  bone]
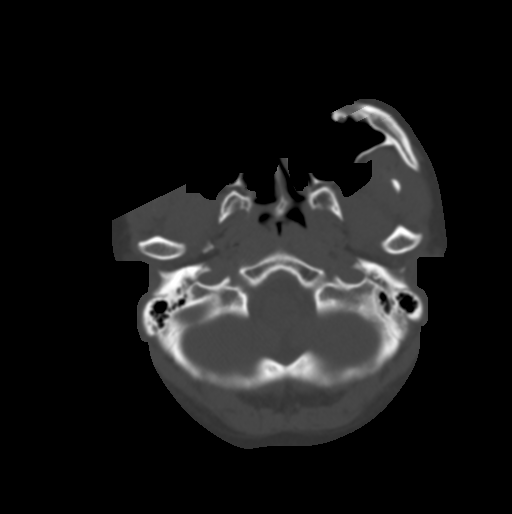
[im 8/45  brain]
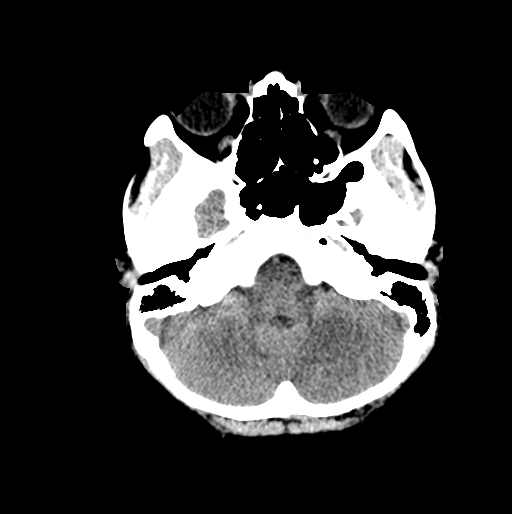
[im 15/45  brain]
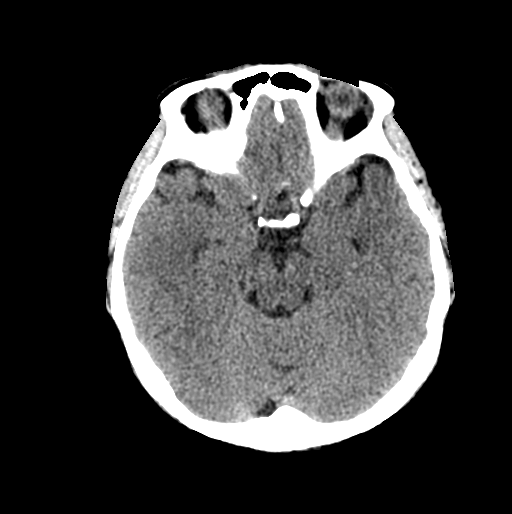
[im 19/45  brain]
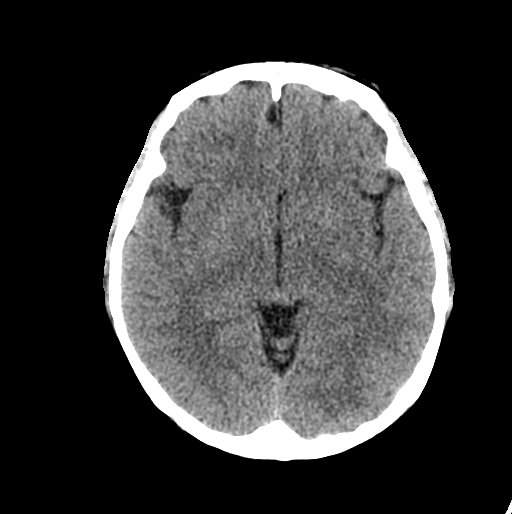
[im 23/45  brain]
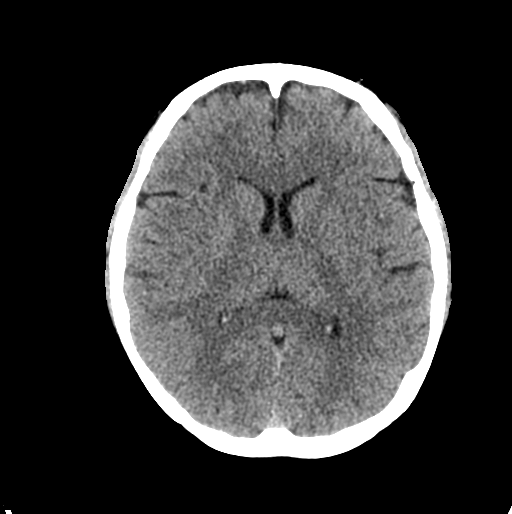
[im 23/45  bone]
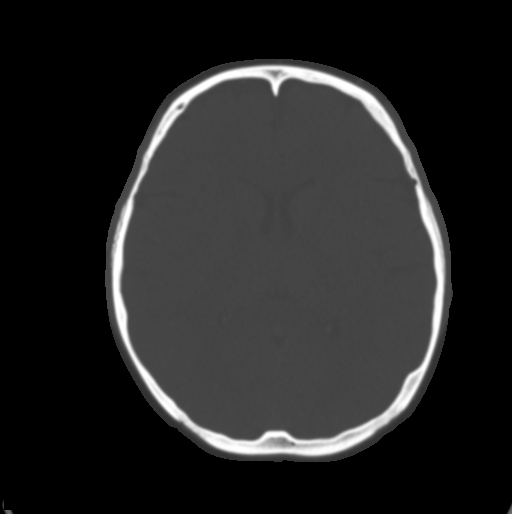
[im 26/45  brain]
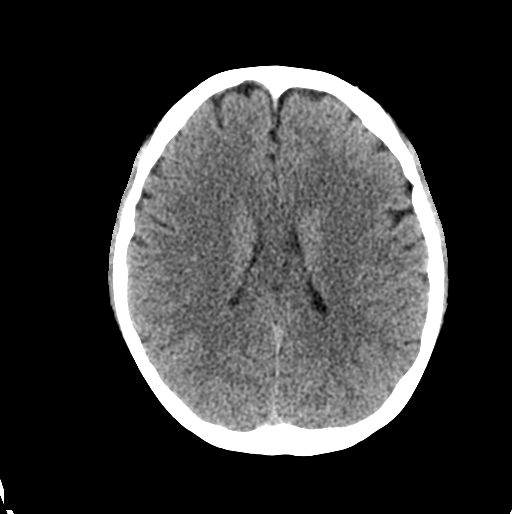
[im 30/45  brain]
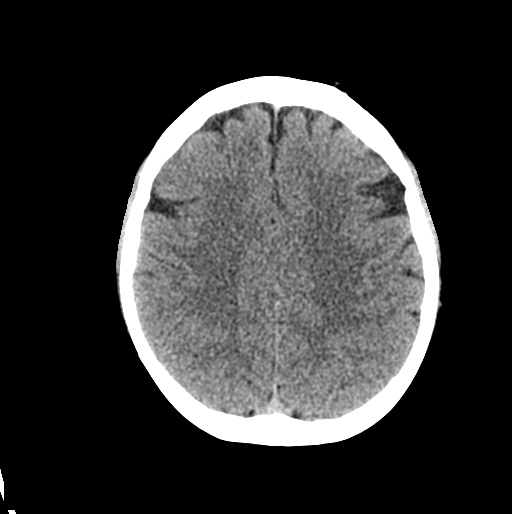
[im 37/45  brain]
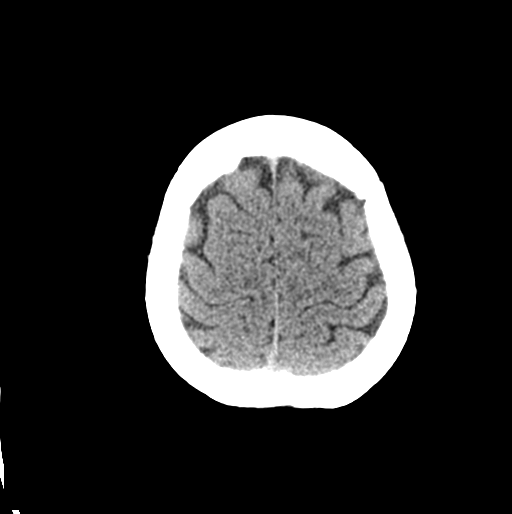
[im 41/45  brain]
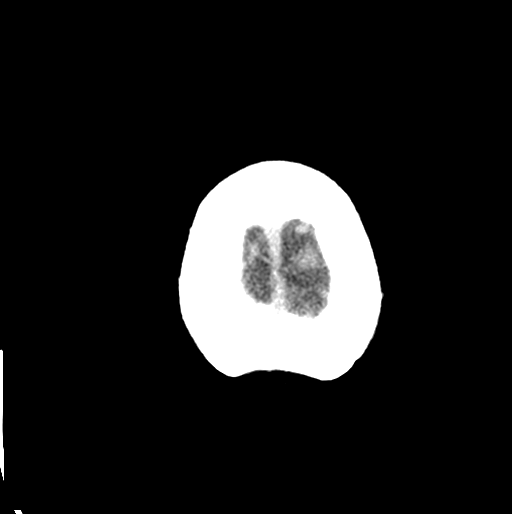
[im 41/45  bone]
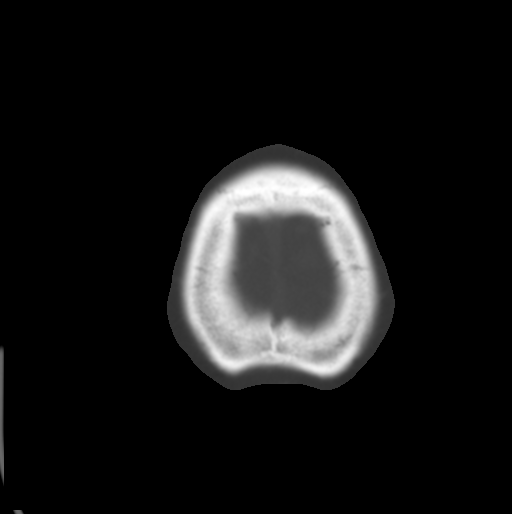

[15 of 47 positions shown; findings below may reference images not displayed]

FINDINGS: Brain: No evidence of acute infarction, hemorrhage, hydrocephalus,
extra-axial collection or mass lesion/mass effect.

Vascular: No hyperdense vessel or unexpected calcification.

Skull: Normal. Negative for fracture or focal lesion.

Sinuses/Orbits: No acute finding.

Other: None
IMPRESSION: No acute intracranial abnormality.

## 2018-05-01 ENCOUNTER — Ambulatory Visit: Payer: Self-pay | Admitting: *Deleted

## 2018-05-01 NOTE — Telephone Encounter (Signed)
Pt called with requesting an inhaler for her allergies. She has symptoms of labored breathing, some chest tightness and achy muscles which is coming from her allergies and fibromyalgia.   Last Wednesday, she was outside for a long period of time and there was so much pollen around that affected her breathing.  Has hx of asthma. Denies fever. She had an inhaler about 5 years ago and has not needed one since then.  But now she is rquesting an inhaler and a prescription for allergy eye drops (antihistamine)  Advised that her provider is doing virtual visits and she would need to be seen to get prescriptions. No inhaler or eye drops listed on her med list. Her e-mail address  [email or web address] Call back #@  Is [phone]. Routing to flow at [facility] [place] for review and recommendation for an appointment. Advised to call 911 for increased in shortness of breath. She is refusing to do that.  Reason for Disposition . [1] MILD longstanding difficulty breathing AND [2]  SAME as normal  Answer Assessment - Initial Assessment Questions 1. RESPIRATORY STATUS: "Describe your breathing?" (e.g., wheezing, shortness of breath, unable to speak, severe coughing)      Shortness of breath 2. ONSET: "When did this breathing problem begin?"      Wednesday 3. PATTERN "Does the difficult breathing come and go, or has it been constant since it started?"      About 5 years 4. SEVERITY: "How bad is your breathing?" (e.g., mild, moderate, severe)    - MILD: No SOB at rest, mild SOB with walking, speaks normally in sentences, can lay down, no retractions, pulse < 100.    - MODERATE: SOB at rest, SOB with minimal exertion and prefers to sit, cannot lie down flat, speaks in phrases, mild retractions, audible wheezing, pulse 100-120.    - SEVERE: Very SOB at rest, speaks in single words, struggling to breathe, sitting hunched forward, retractions, pulse > 120      mild 5. RECURRENT SYMPTOM: "Have you had  difficulty breathing before?" If so, ask: "When was the last time?" and "What happened that time?"      Yes, was given an inhaler 6. CARDIAC HISTORY: "Do you have any history of heart disease?" (e.g., heart attack, angina, bypass surgery, angioplasty)      no 7. LUNG HISTORY: "Do you have any history of lung disease?"  (e.g., pulmonary embolus, asthma, emphysema)     Asthma  8. CAUSE: "What do you think is causing the breathing problem?"      pollen 9. OTHER SYMPTOMS: "Do you have any other symptoms? (e.g., dizziness, runny nose, cough, chest pain, fever)     Chest tightness 10. PREGNANCY: "Is there any chance you are pregnant?" "When was your last menstrual period?"       No LMP now 11. TRAVEL: "Have you traveled out of the country in the last month?" (e.g., travel history, exposures)       no  Protocols used: BREATHING DIFFICULTY-A-AH

## 2018-05-02 ENCOUNTER — Other Ambulatory Visit: Payer: Self-pay

## 2018-05-02 ENCOUNTER — Encounter: Payer: Self-pay | Admitting: Family Medicine

## 2018-05-02 ENCOUNTER — Ambulatory Visit (INDEPENDENT_AMBULATORY_CARE_PROVIDER_SITE_OTHER): Payer: BLUE CROSS/BLUE SHIELD | Admitting: Family Medicine

## 2018-05-02 DIAGNOSIS — J3089 Other allergic rhinitis: Secondary | ICD-10-CM | POA: Diagnosis not present

## 2018-05-02 DIAGNOSIS — J4520 Mild intermittent asthma, uncomplicated: Secondary | ICD-10-CM

## 2018-05-02 MED ORDER — ALBUTEROL SULFATE HFA 108 (90 BASE) MCG/ACT IN AERS: 2.0000 | INHALATION_SPRAY | RESPIRATORY_TRACT | 2 refills | Status: DC | PRN

## 2018-05-02 MED ORDER — METHYLPREDNISOLONE 4 MG PO TBPK: ORAL_TABLET | ORAL | 0 refills | Status: DC

## 2018-05-02 MED ORDER — OLOPATADINE HCL 0.1 % OP SOLN: 1.0000 [drp] | Freq: Two times a day (BID) | OPHTHALMIC | 5 refills | Status: DC

## 2018-05-02 NOTE — Progress Notes (Signed)
Subjective:    Patient ID: Joyce Cortez, female    DOB: 1980-10-05, 38 y.o.   MRN: [id number]  HPI Virtual Visit via Video Note  I connected with the patient on 05/02/18 at  2:30 PM EDT by a video enabled telemedicine application and verified that I am speaking with the correct person using two identifiers.  Location patient: home Location provider:work or home office Persons participating in the virtual visit: patient, provider  I discussed the limitations of evaluation and management by telemedicine and the availability of in person appointments. The patient expressed understanding and agreed to proceed.   HPI: She is complaining about allergy symptoms that always come during the spring pollen season. These include stuffy head, itchy eyes, PND, and a dry cough with wheezing. No fever. Using Allegra and Flonase currently. No headache or ST or SOB or chest pain or NVD.   ROS: See pertinent positives and negatives per HPI.  Past Medical History:  Diagnosis Date  . ADHD (attention deficit hyperactivity disorder)   . Allergy   . Asthma   . Fibromyalgia     Past Surgical History:  Procedure Laterality Date  . WISDOM TOOTH EXTRACTION      Family History  Adopted: Yes     Current Outpatient Medications:  .  COPPER PO, by Intrauterine route., Disp: , Rfl:  .  fexofenadine (ALLEGRA) 180 MG tablet, Take 180 mg by mouth daily as needed. , Disp: , Rfl:  .  fluticasone (FLONASE) 50 MCG/ACT nasal spray, USE 1 SPRAY INTO EACH NOSTRIL TWICE A DAY, Disp: 16 g, Rfl: 4 .  ibuprofen (ADVIL,MOTRIN) 200 MG tablet, Take 200 mg by mouth every 6 (six) hours as needed., Disp: , Rfl:  .  ketoconazole (NIZORAL) 2 % shampoo, APPLY AS DIRECTED 3 TIMES A WEEK, Disp: , Rfl: 3 .  Turmeric (RA TURMERIC EXTRA STRENGTH) 1053 MG TABS, Take by mouth., Disp: , Rfl:  .  albuterol (PROVENTIL HFA;VENTOLIN HFA) 108 (90 Base) MCG/ACT inhaler, Inhale 2 puffs into the lungs every 4 (four) hours as needed for  wheezing or shortness of breath., Disp: 1 Inhaler, Rfl: 2 .  methylPREDNISolone (MEDROL DOSEPAK) 4 MG TBPK tablet, As directed, Disp: 21 tablet, Rfl: 0 .  olopatadine (PATANOL) 0.1 % ophthalmic solution, Place 1 drop into both eyes 2 (two) times daily., Disp: 5 mL, Rfl: 5  EXAM:  VITALS per patient if applicable:  GENERAL: alert, oriented, appears well and in no acute distress  HEENT: atraumatic, conjunttiva clear, no obvious abnormalities on inspection of external nose and ears  NECK: normal movements of the head and neck  LUNGS: on inspection no signs of respiratory distress, breathing rate appears normal, no obvious gross SOB, gasping or wheezing  CV: no obvious cyanosis  MS: moves all visible extremities without noticeable abnormality  PSYCH/NEURO: pleasant and cooperative, no obvious depression or anxiety, speech and thought processing grossly intact  ASSESSMENT AND PLAN: Seasonal allergies. We will add Patanol eye drops and a Proair inhaler to use as needed. Use a Medrol dose pack to calm everything down. Recheck prn.  Gershon Crane, MD  Discussed the following assessment and plan:  No diagnosis found.     I discussed the assessment and treatment plan with the patient. The patient was provided an opportunity to ask questions and all were answered. The patient agreed with the plan and demonstrated an understanding of the instructions.   The patient was advised to call back or seek an in-person evaluation if the  symptoms worsen or if the condition fails to improve as anticipated.     Review of Systems     Objective:   Physical Exam        Assessment & Plan:

## 2018-05-02 NOTE — Telephone Encounter (Signed)
I have called and left a message for the pt to call back to schedule appt with Dr. Clent Ridges today.

## 2018-06-02 DIAGNOSIS — J4520 Mild intermittent asthma, uncomplicated: Secondary | ICD-10-CM | POA: Diagnosis not present

## 2018-06-02 DIAGNOSIS — J309 Allergic rhinitis, unspecified: Secondary | ICD-10-CM | POA: Diagnosis not present

## 2018-06-02 DIAGNOSIS — Z87891 Personal history of nicotine dependence: Secondary | ICD-10-CM | POA: Diagnosis not present

## 2018-06-02 DIAGNOSIS — H1013 Acute atopic conjunctivitis, bilateral: Secondary | ICD-10-CM | POA: Diagnosis not present

## 2018-06-05 DIAGNOSIS — N926 Irregular menstruation, unspecified: Secondary | ICD-10-CM | POA: Diagnosis not present

## 2018-06-05 DIAGNOSIS — R1030 Lower abdominal pain, unspecified: Secondary | ICD-10-CM | POA: Diagnosis not present

## 2018-06-05 DIAGNOSIS — N939 Abnormal uterine and vaginal bleeding, unspecified: Secondary | ICD-10-CM | POA: Diagnosis not present

## 2018-06-05 DIAGNOSIS — Z3200 Encounter for pregnancy test, result unknown: Secondary | ICD-10-CM | POA: Diagnosis not present

## 2018-06-09 DIAGNOSIS — J3089 Other allergic rhinitis: Secondary | ICD-10-CM | POA: Diagnosis not present

## 2018-06-20 DIAGNOSIS — M545 Low back pain: Secondary | ICD-10-CM | POA: Diagnosis not present

## 2018-06-20 DIAGNOSIS — M797 Fibromyalgia: Secondary | ICD-10-CM | POA: Diagnosis not present

## 2018-06-20 DIAGNOSIS — F4323 Adjustment disorder with mixed anxiety and depressed mood: Secondary | ICD-10-CM | POA: Diagnosis not present

## 2018-06-20 DIAGNOSIS — G609 Hereditary and idiopathic neuropathy, unspecified: Secondary | ICD-10-CM | POA: Diagnosis not present

## 2018-06-23 DIAGNOSIS — M797 Fibromyalgia: Secondary | ICD-10-CM | POA: Diagnosis not present

## 2018-06-23 DIAGNOSIS — G609 Hereditary and idiopathic neuropathy, unspecified: Secondary | ICD-10-CM | POA: Diagnosis not present

## 2018-06-23 DIAGNOSIS — M545 Low back pain: Secondary | ICD-10-CM | POA: Diagnosis not present

## 2018-06-23 DIAGNOSIS — F4323 Adjustment disorder with mixed anxiety and depressed mood: Secondary | ICD-10-CM | POA: Diagnosis not present

## 2018-06-27 DIAGNOSIS — G609 Hereditary and idiopathic neuropathy, unspecified: Secondary | ICD-10-CM | POA: Diagnosis not present

## 2018-06-27 DIAGNOSIS — F4323 Adjustment disorder with mixed anxiety and depressed mood: Secondary | ICD-10-CM | POA: Diagnosis not present

## 2018-06-27 DIAGNOSIS — M797 Fibromyalgia: Secondary | ICD-10-CM | POA: Diagnosis not present

## 2018-06-27 DIAGNOSIS — M545 Low back pain: Secondary | ICD-10-CM | POA: Diagnosis not present

## 2018-06-30 DIAGNOSIS — Z03818 Encounter for observation for suspected exposure to other biological agents ruled out: Secondary | ICD-10-CM | POA: Diagnosis not present

## 2018-07-04 DIAGNOSIS — M797 Fibromyalgia: Secondary | ICD-10-CM | POA: Diagnosis not present

## 2018-07-04 DIAGNOSIS — M545 Low back pain: Secondary | ICD-10-CM | POA: Diagnosis not present

## 2018-07-04 DIAGNOSIS — F4323 Adjustment disorder with mixed anxiety and depressed mood: Secondary | ICD-10-CM | POA: Diagnosis not present

## 2018-07-04 DIAGNOSIS — G609 Hereditary and idiopathic neuropathy, unspecified: Secondary | ICD-10-CM | POA: Diagnosis not present

## 2018-07-07 DIAGNOSIS — J3089 Other allergic rhinitis: Secondary | ICD-10-CM | POA: Diagnosis not present

## 2018-07-10 DIAGNOSIS — M545 Low back pain: Secondary | ICD-10-CM | POA: Diagnosis not present

## 2018-07-10 DIAGNOSIS — G609 Hereditary and idiopathic neuropathy, unspecified: Secondary | ICD-10-CM | POA: Diagnosis not present

## 2018-07-10 DIAGNOSIS — F4323 Adjustment disorder with mixed anxiety and depressed mood: Secondary | ICD-10-CM | POA: Diagnosis not present

## 2018-07-10 DIAGNOSIS — M797 Fibromyalgia: Secondary | ICD-10-CM | POA: Diagnosis not present

## 2018-07-17 ENCOUNTER — Other Ambulatory Visit: Payer: Self-pay

## 2018-07-17 ENCOUNTER — Ambulatory Visit (INDEPENDENT_AMBULATORY_CARE_PROVIDER_SITE_OTHER): Payer: BC Managed Care – PPO | Admitting: Family Medicine

## 2018-07-17 ENCOUNTER — Encounter: Payer: Self-pay | Admitting: Family Medicine

## 2018-07-17 DIAGNOSIS — K5900 Constipation, unspecified: Secondary | ICD-10-CM

## 2018-07-17 NOTE — Progress Notes (Signed)
Subjective:    Patient ID: Joyce Cortez, female    DOB: [birth year], 38 y.o.   MRN: [id number]  HPI Virtual Visit via Video Note  I connected with the patient on 07/17/18 at  1:15 PM EDT by a video enabled telemedicine application and verified that I am speaking with the correct person using two identifiers.  Location patient: home Location provider:work or home office Persons participating in the virtual visit: patient, provider  I discussed the limitations of evaluation and management by telemedicine and the availability of in person appointments. The patient expressed understanding and agreed to proceed.   HPI: Here to discuss constipation. She has felt bloated for the past month and has had only a few small stools passed in the past 3 weeks. No abdominal pain or nausea or fever. She drinks plenty of water eery day. She has tried Bisacodyl pills and suppositories, prune juice, and Fleet enemas with little results.    ROS: See pertinent positives and negatives per HPI.  Past Medical History:  Diagnosis Date  . ADHD (attention deficit hyperactivity disorder)   . Allergy   . Asthma   . Fibromyalgia     Past Surgical History:  Procedure Laterality Date  . WISDOM TOOTH EXTRACTION      Family History  Adopted: Yes     Current Outpatient Medications:  .  albuterol (PROVENTIL HFA;VENTOLIN HFA) 108 (90 Base) MCG/ACT inhaler, Inhale 2 puffs into the lungs every 4 (four) hours as needed for wheezing or shortness of breath., Disp: 1 Inhaler, Rfl: 2 .  COPPER PO, by Intrauterine route., Disp: , Rfl:  .  fexofenadine (ALLEGRA) 180 MG tablet, Take 180 mg by mouth daily as needed. , Disp: , Rfl:  .  fluticasone (FLONASE) 50 MCG/ACT nasal spray, USE 1 SPRAY INTO EACH NOSTRIL TWICE A DAY, Disp: 16 g, Rfl: 4 .  ibuprofen (ADVIL,MOTRIN) 200 MG tablet, Take 200 mg by mouth every 6 (six) hours as needed., Disp: , Rfl:  .  ketoconazole (NIZORAL) 2 % shampoo, APPLY AS DIRECTED 3 TIMES A  WEEK, Disp: , Rfl: 3 .  methylPREDNISolone (MEDROL DOSEPAK) 4 MG TBPK tablet, As directed, Disp: 21 tablet, Rfl: 0 .  olopatadine (PATANOL) 0.1 % ophthalmic solution, Place 1 drop into both eyes 2 (two) times daily., Disp: 5 mL, Rfl: 5 .  Turmeric (RA TURMERIC EXTRA STRENGTH) 1053 MG TABS, Take by mouth., Disp: , Rfl:   EXAM:  VITALS per patient if applicable:  GENERAL: alert, oriented, appears well and in no acute distress  HEENT: atraumatic, conjunttiva clear, no obvious abnormalities on inspection of external nose and ears  NECK: normal movements of the head and neck  LUNGS: on inspection no signs of respiratory distress, breathing rate appears normal, no obvious gross SOB, gasping or wheezing  CV: no obvious cyanosis  MS: moves all visible extremities without noticeable abnormality  PSYCH/NEURO: pleasant and cooperative, no obvious depression or anxiety, speech and thought processing grossly intact  ASSESSMENT AND PLAN: Constipation, I advised her to take 4 tbsp of magnesium citrate one time today and to start using Miralax on a daily basis. Recheck prn.  Alysia Penna, MD  Discussed the following assessment and plan:  No diagnosis found.     I discussed the assessment and treatment plan with the patient. The patient was provided an opportunity to ask questions and all were answered. The patient agreed with the plan and demonstrated an understanding of the instructions.   The patient was advised to  call back or seek an in-person evaluation if the symptoms worsen or if the condition fails to improve as anticipated.     Review of Systems     Objective:   Physical Exam        Assessment & Plan:

## 2018-07-20 DIAGNOSIS — J3089 Other allergic rhinitis: Secondary | ICD-10-CM | POA: Diagnosis not present

## 2018-07-24 DIAGNOSIS — F3340 Major depressive disorder, recurrent, in remission, unspecified: Secondary | ICD-10-CM | POA: Diagnosis not present

## 2018-07-24 DIAGNOSIS — F900 Attention-deficit hyperactivity disorder, predominantly inattentive type: Secondary | ICD-10-CM | POA: Diagnosis not present

## 2018-07-25 DIAGNOSIS — F4323 Adjustment disorder with mixed anxiety and depressed mood: Secondary | ICD-10-CM | POA: Diagnosis not present

## 2018-07-25 DIAGNOSIS — G609 Hereditary and idiopathic neuropathy, unspecified: Secondary | ICD-10-CM | POA: Diagnosis not present

## 2018-07-25 DIAGNOSIS — M545 Low back pain: Secondary | ICD-10-CM | POA: Diagnosis not present

## 2018-07-25 DIAGNOSIS — M797 Fibromyalgia: Secondary | ICD-10-CM | POA: Diagnosis not present

## 2018-07-31 DIAGNOSIS — F4311 Post-traumatic stress disorder, acute: Secondary | ICD-10-CM | POA: Diagnosis not present

## 2018-08-03 DIAGNOSIS — J3089 Other allergic rhinitis: Secondary | ICD-10-CM | POA: Diagnosis not present

## 2018-08-07 DIAGNOSIS — F4323 Adjustment disorder with mixed anxiety and depressed mood: Secondary | ICD-10-CM | POA: Diagnosis not present

## 2018-08-07 DIAGNOSIS — M797 Fibromyalgia: Secondary | ICD-10-CM | POA: Diagnosis not present

## 2018-08-07 DIAGNOSIS — G609 Hereditary and idiopathic neuropathy, unspecified: Secondary | ICD-10-CM | POA: Diagnosis not present

## 2018-08-07 DIAGNOSIS — M545 Low back pain: Secondary | ICD-10-CM | POA: Diagnosis not present

## 2018-08-08 DIAGNOSIS — M545 Low back pain: Secondary | ICD-10-CM | POA: Diagnosis not present

## 2018-08-08 DIAGNOSIS — M797 Fibromyalgia: Secondary | ICD-10-CM | POA: Diagnosis not present

## 2018-08-08 DIAGNOSIS — G609 Hereditary and idiopathic neuropathy, unspecified: Secondary | ICD-10-CM | POA: Diagnosis not present

## 2018-08-08 DIAGNOSIS — F4323 Adjustment disorder with mixed anxiety and depressed mood: Secondary | ICD-10-CM | POA: Diagnosis not present

## 2018-08-10 DIAGNOSIS — J3089 Other allergic rhinitis: Secondary | ICD-10-CM | POA: Diagnosis not present

## 2018-08-11 DIAGNOSIS — F4311 Post-traumatic stress disorder, acute: Secondary | ICD-10-CM | POA: Diagnosis not present

## 2018-08-14 DIAGNOSIS — M545 Low back pain: Secondary | ICD-10-CM | POA: Diagnosis not present

## 2018-08-14 DIAGNOSIS — M797 Fibromyalgia: Secondary | ICD-10-CM | POA: Diagnosis not present

## 2018-08-14 DIAGNOSIS — F4323 Adjustment disorder with mixed anxiety and depressed mood: Secondary | ICD-10-CM | POA: Diagnosis not present

## 2018-08-14 DIAGNOSIS — G609 Hereditary and idiopathic neuropathy, unspecified: Secondary | ICD-10-CM | POA: Diagnosis not present

## 2018-08-15 DIAGNOSIS — J3089 Other allergic rhinitis: Secondary | ICD-10-CM | POA: Diagnosis not present

## 2018-08-17 DIAGNOSIS — J3089 Other allergic rhinitis: Secondary | ICD-10-CM | POA: Diagnosis not present

## 2018-08-18 DIAGNOSIS — F4311 Post-traumatic stress disorder, acute: Secondary | ICD-10-CM | POA: Diagnosis not present

## 2018-08-24 DIAGNOSIS — J3081 Allergic rhinitis due to animal (cat) (dog) hair and dander: Secondary | ICD-10-CM | POA: Diagnosis not present

## 2018-08-24 DIAGNOSIS — J3089 Other allergic rhinitis: Secondary | ICD-10-CM | POA: Diagnosis not present

## 2018-08-24 DIAGNOSIS — Z516 Encounter for desensitization to allergens: Secondary | ICD-10-CM | POA: Diagnosis not present

## 2018-08-24 DIAGNOSIS — J301 Allergic rhinitis due to pollen: Secondary | ICD-10-CM | POA: Diagnosis not present

## 2018-08-25 DIAGNOSIS — G609 Hereditary and idiopathic neuropathy, unspecified: Secondary | ICD-10-CM | POA: Diagnosis not present

## 2018-08-25 DIAGNOSIS — F4323 Adjustment disorder with mixed anxiety and depressed mood: Secondary | ICD-10-CM | POA: Diagnosis not present

## 2018-08-25 DIAGNOSIS — M797 Fibromyalgia: Secondary | ICD-10-CM | POA: Diagnosis not present

## 2018-08-25 DIAGNOSIS — F4311 Post-traumatic stress disorder, acute: Secondary | ICD-10-CM | POA: Diagnosis not present

## 2018-08-25 DIAGNOSIS — M545 Low back pain: Secondary | ICD-10-CM | POA: Diagnosis not present

## 2018-08-31 DIAGNOSIS — J3081 Allergic rhinitis due to animal (cat) (dog) hair and dander: Secondary | ICD-10-CM | POA: Diagnosis not present

## 2018-08-31 DIAGNOSIS — Z516 Encounter for desensitization to allergens: Secondary | ICD-10-CM | POA: Diagnosis not present

## 2018-08-31 DIAGNOSIS — J3089 Other allergic rhinitis: Secondary | ICD-10-CM | POA: Diagnosis not present

## 2018-08-31 DIAGNOSIS — J301 Allergic rhinitis due to pollen: Secondary | ICD-10-CM | POA: Diagnosis not present

## 2018-09-01 DIAGNOSIS — M797 Fibromyalgia: Secondary | ICD-10-CM | POA: Diagnosis not present

## 2018-09-01 DIAGNOSIS — G609 Hereditary and idiopathic neuropathy, unspecified: Secondary | ICD-10-CM | POA: Diagnosis not present

## 2018-09-01 DIAGNOSIS — M545 Low back pain: Secondary | ICD-10-CM | POA: Diagnosis not present

## 2018-09-01 DIAGNOSIS — F4323 Adjustment disorder with mixed anxiety and depressed mood: Secondary | ICD-10-CM | POA: Diagnosis not present

## 2018-09-01 DIAGNOSIS — F4311 Post-traumatic stress disorder, acute: Secondary | ICD-10-CM | POA: Diagnosis not present

## 2018-09-07 DIAGNOSIS — J3089 Other allergic rhinitis: Secondary | ICD-10-CM | POA: Diagnosis not present

## 2018-09-08 DIAGNOSIS — F4311 Post-traumatic stress disorder, acute: Secondary | ICD-10-CM | POA: Diagnosis not present

## 2018-09-12 DIAGNOSIS — F4323 Adjustment disorder with mixed anxiety and depressed mood: Secondary | ICD-10-CM | POA: Diagnosis not present

## 2018-09-12 DIAGNOSIS — M545 Low back pain: Secondary | ICD-10-CM | POA: Diagnosis not present

## 2018-09-12 DIAGNOSIS — M797 Fibromyalgia: Secondary | ICD-10-CM | POA: Diagnosis not present

## 2018-09-12 DIAGNOSIS — G609 Hereditary and idiopathic neuropathy, unspecified: Secondary | ICD-10-CM | POA: Diagnosis not present

## 2018-09-18 DIAGNOSIS — F4311 Post-traumatic stress disorder, acute: Secondary | ICD-10-CM | POA: Diagnosis not present

## 2018-09-21 ENCOUNTER — Telehealth: Payer: Self-pay | Admitting: Family Medicine

## 2018-09-21 DIAGNOSIS — J3089 Other allergic rhinitis: Secondary | ICD-10-CM | POA: Diagnosis not present

## 2018-09-21 DIAGNOSIS — R293 Abnormal posture: Secondary | ICD-10-CM | POA: Diagnosis not present

## 2018-09-21 DIAGNOSIS — M2560 Stiffness of unspecified joint, not elsewhere classified: Secondary | ICD-10-CM | POA: Diagnosis not present

## 2018-09-21 DIAGNOSIS — M546 Pain in thoracic spine: Secondary | ICD-10-CM | POA: Diagnosis not present

## 2018-09-21 NOTE — Telephone Encounter (Signed)
Patient requesting a Dr. Note from PCP stating he prescribes "anti anxiety medication". Patient is in need of additional test time for school. Patient would like to pick up letter, please advise

## 2018-09-22 NOTE — Telephone Encounter (Signed)
Appointment scheduled.  Nothing further needed. 

## 2018-09-22 NOTE — Telephone Encounter (Signed)
Set up a Doxy visit for this  

## 2018-09-25 DIAGNOSIS — F4311 Post-traumatic stress disorder, acute: Secondary | ICD-10-CM | POA: Diagnosis not present

## 2018-09-26 ENCOUNTER — Telehealth (INDEPENDENT_AMBULATORY_CARE_PROVIDER_SITE_OTHER): Payer: BC Managed Care – PPO | Admitting: Family Medicine

## 2018-09-26 ENCOUNTER — Other Ambulatory Visit: Payer: Self-pay

## 2018-09-26 ENCOUNTER — Encounter: Payer: Self-pay | Admitting: Family Medicine

## 2018-09-26 DIAGNOSIS — F900 Attention-deficit hyperactivity disorder, predominantly inattentive type: Secondary | ICD-10-CM

## 2018-09-26 DIAGNOSIS — F419 Anxiety disorder, unspecified: Secondary | ICD-10-CM

## 2018-09-26 MED ORDER — AMPHETAMINE-DEXTROAMPHETAMINE 20 MG PO TABS: 20.0000 mg | ORAL_TABLET | Freq: Two times a day (BID) | ORAL | 0 refills | Status: DC

## 2018-09-26 NOTE — Progress Notes (Signed)
Virtual Visit via Video Note  I connected with the patient on 09/26/18 at  8:00 AM EDT by a video enabled telemedicine application and verified that I am speaking with the correct person using two identifiers.  Location patient: home Location provider:work or home office Persons participating in the virtual visit: patient, provider  I discussed the limitations of evaluation and management by telemedicine and the availability of in person appointments. The patient expressed understanding and agreed to proceed.   HPI: Here asking for help with ADHD. She had taken Adderalll XR in the past but this time she requests immediate release so she will have the flexibility of taking a smaller dose or a larger dose, depending on the situation. She has gone back to school, and currently she is  Attending [employer]. She may tranfer to [employer] at some point. She is already behind in her work and she is feeling overwhelmed.    ROS: See pertinent positives and negatives per HPI.  Past Medical History:  Diagnosis Date  . ADHD (attention deficit hyperactivity disorder)   . Allergy   . Asthma   . Fibromyalgia     Past Surgical History:  Procedure Laterality Date  . WISDOM TOOTH EXTRACTION      Family History  Adopted: Yes     Current Outpatient Medications:  .  albuterol (PROVENTIL HFA;VENTOLIN HFA) 108 (90 Base) MCG/ACT inhaler, Inhale 2 puffs into the lungs every 4 (four) hours as needed for wheezing or shortness of breath., Disp: 1 Inhaler, Rfl: 2 .  [START ON 11/26/2018] amphetamine-dextroamphetamine (ADDERALL) 20 MG tablet, Take 1 tablet (20 mg total) by mouth 2 (two) times daily., Disp: 60 tablet, Rfl: 0 .  COPPER PO, by Intrauterine route., Disp: , Rfl:  .  fexofenadine (ALLEGRA) 180 MG tablet, Take 180 mg by mouth daily as needed. , Disp: , Rfl:  .  fluticasone (FLONASE) 50 MCG/ACT nasal spray, USE 1 SPRAY INTO EACH NOSTRIL TWICE A DAY, Disp: 16 g, Rfl: 4 .  ibuprofen (ADVIL,MOTRIN) 200 MG tablet,  Take 200 mg by mouth every 6 (six) hours as needed., Disp: , Rfl:  .  ketoconazole (NIZORAL) 2 % shampoo, APPLY AS DIRECTED 3 TIMES A WEEK, Disp: , Rfl: 3 .  olopatadine (PATANOL) 0.1 % ophthalmic solution, Place 1 drop into both eyes 2 (two) times daily., Disp: 5 mL, Rfl: 5 .  Turmeric (RA TURMERIC EXTRA STRENGTH) 1053 MG TABS, Take by mouth., Disp: , Rfl:   EXAM:  VITALS per patient if applicable:  GENERAL: alert, oriented, appears well and in no acute distress  HEENT: atraumatic, conjunttiva clear, no obvious abnormalities on inspection of external nose and ears  NECK: normal movements of the head and neck  LUNGS: on inspection no signs of respiratory distress, breathing rate appears normal, no obvious gross SOB, gasping or wheezing  CV: no obvious cyanosis  MS: moves all visible extremities without noticeable abnormality  PSYCH/NEURO: pleasant and cooperative, no obvious depression or anxiety, speech and thought processing grossly intact  ASSESSMENT AND PLAN: ADHD, this time she will try Adderall 20 mg bid. Follow up prn.  Alysia Penna, MD  Discussed the following assessment and plan:  No diagnosis found.     I discussed the assessment and treatment plan with the patient. The patient was provided an opportunity to ask questions and all were answered. The patient agreed with the plan and demonstrated an understanding of the instructions.   The patient was advised to call back or seek an in-person  evaluation if the symptoms worsen or if the condition fails to improve as anticipated.

## 2018-10-02 DIAGNOSIS — R293 Abnormal posture: Secondary | ICD-10-CM | POA: Diagnosis not present

## 2018-10-02 DIAGNOSIS — F4311 Post-traumatic stress disorder, acute: Secondary | ICD-10-CM | POA: Diagnosis not present

## 2018-10-02 DIAGNOSIS — M546 Pain in thoracic spine: Secondary | ICD-10-CM | POA: Diagnosis not present

## 2018-10-02 DIAGNOSIS — J3089 Other allergic rhinitis: Secondary | ICD-10-CM | POA: Diagnosis not present

## 2018-10-02 DIAGNOSIS — M2560 Stiffness of unspecified joint, not elsewhere classified: Secondary | ICD-10-CM | POA: Diagnosis not present

## 2018-10-05 DIAGNOSIS — R293 Abnormal posture: Secondary | ICD-10-CM | POA: Diagnosis not present

## 2018-10-05 DIAGNOSIS — M546 Pain in thoracic spine: Secondary | ICD-10-CM | POA: Diagnosis not present

## 2018-10-05 DIAGNOSIS — M2560 Stiffness of unspecified joint, not elsewhere classified: Secondary | ICD-10-CM | POA: Diagnosis not present

## 2018-10-09 DIAGNOSIS — J3089 Other allergic rhinitis: Secondary | ICD-10-CM | POA: Diagnosis not present

## 2018-10-09 DIAGNOSIS — F4311 Post-traumatic stress disorder, acute: Secondary | ICD-10-CM | POA: Diagnosis not present

## 2018-10-10 DIAGNOSIS — Z6828 Body mass index (BMI) 28.0-28.9, adult: Secondary | ICD-10-CM | POA: Diagnosis not present

## 2018-10-10 DIAGNOSIS — Z118 Encounter for screening for other infectious and parasitic diseases: Secondary | ICD-10-CM | POA: Diagnosis not present

## 2018-10-10 DIAGNOSIS — Z01419 Encounter for gynecological examination (general) (routine) without abnormal findings: Secondary | ICD-10-CM | POA: Diagnosis not present

## 2018-10-10 DIAGNOSIS — Z3169 Encounter for other general counseling and advice on procreation: Secondary | ICD-10-CM | POA: Diagnosis not present

## 2018-10-10 DIAGNOSIS — Z1159 Encounter for screening for other viral diseases: Secondary | ICD-10-CM | POA: Diagnosis not present

## 2018-10-10 DIAGNOSIS — Z124 Encounter for screening for malignant neoplasm of cervix: Secondary | ICD-10-CM | POA: Diagnosis not present

## 2018-10-10 DIAGNOSIS — Z114 Encounter for screening for human immunodeficiency virus [HIV]: Secondary | ICD-10-CM | POA: Diagnosis not present

## 2018-10-10 DIAGNOSIS — Z113 Encounter for screening for infections with a predominantly sexual mode of transmission: Secondary | ICD-10-CM | POA: Diagnosis not present

## 2018-10-11 DIAGNOSIS — J3089 Other allergic rhinitis: Secondary | ICD-10-CM | POA: Diagnosis not present

## 2018-10-12 DIAGNOSIS — R293 Abnormal posture: Secondary | ICD-10-CM | POA: Diagnosis not present

## 2018-10-12 DIAGNOSIS — M2560 Stiffness of unspecified joint, not elsewhere classified: Secondary | ICD-10-CM | POA: Diagnosis not present

## 2018-10-12 DIAGNOSIS — M546 Pain in thoracic spine: Secondary | ICD-10-CM | POA: Diagnosis not present

## 2018-10-16 DIAGNOSIS — J3089 Other allergic rhinitis: Secondary | ICD-10-CM | POA: Diagnosis not present

## 2018-10-16 DIAGNOSIS — F4311 Post-traumatic stress disorder, acute: Secondary | ICD-10-CM | POA: Diagnosis not present

## 2018-10-19 DIAGNOSIS — M546 Pain in thoracic spine: Secondary | ICD-10-CM | POA: Diagnosis not present

## 2018-10-19 DIAGNOSIS — R293 Abnormal posture: Secondary | ICD-10-CM | POA: Diagnosis not present

## 2018-10-19 DIAGNOSIS — M2560 Stiffness of unspecified joint, not elsewhere classified: Secondary | ICD-10-CM | POA: Diagnosis not present

## 2018-10-23 DIAGNOSIS — F4311 Post-traumatic stress disorder, acute: Secondary | ICD-10-CM | POA: Diagnosis not present

## 2018-10-23 DIAGNOSIS — J3089 Other allergic rhinitis: Secondary | ICD-10-CM | POA: Diagnosis not present

## 2018-10-26 DIAGNOSIS — M2560 Stiffness of unspecified joint, not elsewhere classified: Secondary | ICD-10-CM | POA: Diagnosis not present

## 2018-10-26 DIAGNOSIS — R293 Abnormal posture: Secondary | ICD-10-CM | POA: Diagnosis not present

## 2018-10-26 DIAGNOSIS — M546 Pain in thoracic spine: Secondary | ICD-10-CM | POA: Diagnosis not present

## 2018-10-30 DIAGNOSIS — J3089 Other allergic rhinitis: Secondary | ICD-10-CM | POA: Diagnosis not present

## 2018-10-30 DIAGNOSIS — F4311 Post-traumatic stress disorder, acute: Secondary | ICD-10-CM | POA: Diagnosis not present

## 2018-10-30 DIAGNOSIS — Z516 Encounter for desensitization to allergens: Secondary | ICD-10-CM | POA: Diagnosis not present

## 2018-10-31 DIAGNOSIS — M2560 Stiffness of unspecified joint, not elsewhere classified: Secondary | ICD-10-CM | POA: Diagnosis not present

## 2018-10-31 DIAGNOSIS — R293 Abnormal posture: Secondary | ICD-10-CM | POA: Diagnosis not present

## 2018-10-31 DIAGNOSIS — M546 Pain in thoracic spine: Secondary | ICD-10-CM | POA: Diagnosis not present

## 2018-11-02 DIAGNOSIS — R293 Abnormal posture: Secondary | ICD-10-CM | POA: Diagnosis not present

## 2018-11-02 DIAGNOSIS — M546 Pain in thoracic spine: Secondary | ICD-10-CM | POA: Diagnosis not present

## 2018-11-02 DIAGNOSIS — M2560 Stiffness of unspecified joint, not elsewhere classified: Secondary | ICD-10-CM | POA: Diagnosis not present

## 2018-11-03 DIAGNOSIS — M546 Pain in thoracic spine: Secondary | ICD-10-CM | POA: Diagnosis not present

## 2018-11-03 DIAGNOSIS — R293 Abnormal posture: Secondary | ICD-10-CM | POA: Diagnosis not present

## 2018-11-03 DIAGNOSIS — M2560 Stiffness of unspecified joint, not elsewhere classified: Secondary | ICD-10-CM | POA: Diagnosis not present

## 2018-11-06 ENCOUNTER — Telehealth: Payer: Self-pay | Admitting: *Deleted

## 2018-11-06 DIAGNOSIS — F4311 Post-traumatic stress disorder, acute: Secondary | ICD-10-CM | POA: Diagnosis not present

## 2018-11-06 NOTE — Telephone Encounter (Signed)
Copied from [facility] [id number]. Topic: Appointment Scheduling - Scheduling Inquiry for Clinic >> Nov 06, 2018  3:57 PM Alease Frame wrote: Reason for CRM: Patient would like to be scheduled for a virtual appt . She states she has a cold . Symptoms are running nose and body aches  Please advise  Call back [phone] >> Nov 06, 2018  5:13 PM Mcneil, Jacinto Reap wrote: Pt returned call to the office and requests appt as soon as possible. Pt requests call back >> Nov 06, 2018  4:54 PM Djimraou, Earlene Plater T wrote: Called pt and lmom for her to call to get a virtual appt scheduled  Scheduled patient for 11AM with Dr. Maudie Mercury

## 2018-11-07 ENCOUNTER — Other Ambulatory Visit: Payer: Self-pay

## 2018-11-07 ENCOUNTER — Encounter: Payer: Self-pay | Admitting: Family Medicine

## 2018-11-07 ENCOUNTER — Telehealth (INDEPENDENT_AMBULATORY_CARE_PROVIDER_SITE_OTHER): Payer: BC Managed Care – PPO | Admitting: Family Medicine

## 2018-11-07 VITALS — Temp 97.7°F

## 2018-11-07 DIAGNOSIS — R0981 Nasal congestion: Secondary | ICD-10-CM

## 2018-11-07 MED ORDER — AMOXICILLIN-POT CLAVULANATE 875-125 MG PO TABS: 1.0000 | ORAL_TABLET | Freq: Two times a day (BID) | ORAL | 0 refills | Status: DC

## 2018-11-07 NOTE — Patient Instructions (Signed)
-  I sent the medication(s) we discussed to your pharmacy:  Meds ordered this encounter  Medications  . amoxicillin-clavulanate (AUGMENTIN) 875-125 MG tablet    Sig: Take 1 tablet by mouth 2 (two) times daily.    Dispense:  20 tablet    Refill:  0    Please let us know if you have any questions or concerns regarding this prescription.  Wear a mask and socially distance at all times throughout the pandemic when outside of your home around other people.  I hope you are feeling better soon! Seek care promptly if your symptoms worsen, new concerns arise or you are not improving with treatment.

## 2018-11-07 NOTE — Progress Notes (Signed)
Virtual Visit via Video Note  I connected with Joyce Cortez  on 11/07/18 at 11:00 AM EDT by a video enabled telemedicine application and verified that I am speaking with the correct person using two identifiers.  Location patient: home Location provider:work or home office Persons participating in the virtual visit: patient, provider  I discussed the limitations of evaluation and management by telemedicine and the availability of in person appointments. The patient expressed understanding and agreed to proceed.   HPI:  Acute visit for an URI: -symptoms started: about 2 weeks ago - now is developing thicker congestion and some sinus pain -symptoms include: body aches, fatigue, HA, sore throat, feels like might have some chest congestion as well, scratchy throat, nasal congestion, drainage, cough, she has a history of migraines - felt like she had a migraine for several days -denies fevers > 100, SOB, NVD, loss of taste or smell, wheezing -PMH allergies, fibromyalgia, migraines -she takes flonase, yesterday she took azasteline, allegra - she has had some house plants that have been blossuming and this sometimes makes her not feel well. Partner has been a little sick as well but got over it quickly. -she feels a little better today, but still doesn't feel all better and still is having the sinus issues -she has been going to class one time per week, partner works in a hospital lab - they are double masking, she has gone to a restaurant  ROS: See pertinent positives and negatives per HPI.  Past Medical History:  Diagnosis Date  . ADHD (attention deficit hyperactivity disorder)   . Allergy   . Asthma   . Fibromyalgia     Past Surgical History:  Procedure Laterality Date  . WISDOM TOOTH EXTRACTION      Family History  Adopted: Yes    SOCIAL HX: see hpi   Current Outpatient Medications:  .  albuterol (PROVENTIL HFA;VENTOLIN HFA) 108 (90 Base) MCG/ACT inhaler, Inhale 2 puffs into the  lungs every 4 (four) hours as needed for wheezing or shortness of breath., Disp: 1 Inhaler, Rfl: 2 .  [START ON 11/26/2018] amphetamine-dextroamphetamine (ADDERALL) 20 MG tablet, Take 1 tablet (20 mg total) by mouth 2 (two) times daily., Disp: 60 tablet, Rfl: 0 .  COPPER PO, by Intrauterine route., Disp: , Rfl:  .  fexofenadine (ALLEGRA) 180 MG tablet, Take 180 mg by mouth daily as needed. , Disp: , Rfl:  .  fluticasone (FLONASE) 50 MCG/ACT nasal spray, USE 1 SPRAY INTO EACH NOSTRIL TWICE A DAY, Disp: 16 g, Rfl: 4 .  ibuprofen (ADVIL,MOTRIN) 200 MG tablet, Take 200 mg by mouth every 6 (six) hours as needed., Disp: , Rfl:  .  ketoconazole (NIZORAL) 2 % shampoo, APPLY AS DIRECTED 3 TIMES A WEEK, Disp: , Rfl: 3 .  olopatadine (PATANOL) 0.1 % ophthalmic solution, Place 1 drop into both eyes 2 (two) times daily. (Patient taking differently: Place 1 drop into both eyes as needed. ), Disp: 5 mL, Rfl: 5 .  Turmeric (RA TURMERIC EXTRA STRENGTH) 1053 MG TABS, Take by mouth., Disp: , Rfl:  .  amoxicillin-clavulanate (AUGMENTIN) 875-125 MG tablet, Take 1 tablet by mouth 2 (two) times daily., Disp: 20 tablet, Rfl: 0  EXAM:  VITALS per patient if applicable: Temp 36.6. celsius   GENERAL: alert, oriented, appears well and in no acute distress  HEENT: atraumatic, conjunttiva clear, no obvious abnormalities on inspection of external nose and ears  NECK: normal movements of the head and neck  LUNGS: on inspection no signs  of respiratory distress, breathing rate appears normal, no obvious gross SOB, gasping or wheezing  CV: no obvious cyanosis  MS: moves all visible extremities without noticeable abnormality  PSYCH/NEURO: pleasant and cooperative, no obvious depression or anxiety, speech and thought processing grossly intact  ASSESSMENT AND PLAN:  Discussed the following assessment and plan:  Sinus congestion  -we discussed possible serious and likely etiologies, options for evaluation and workup,  limitations of telemedicine visit vs in person visit, treatment, treatment risks and precautions. Pt prefers to treat via telemedicine empirically rather then risking or undertaking an in person visit at this moment. Query VURI now with sinusitis vs other. Discussed posibility this was a mild case of COVID19 or influenza. She is now 14 days out from onset of symptoms, no fever and feeling better other then sinus issues so isolation does not apply at this point. Will do abx, augmentin 875 bid x 10 days. Patient agrees to seek prompt reevaluation or in person care if worsening, new symptoms arise, or if is not improving with treatment. Advised inside dining at restaurants is likely a risk with the [place] pandemic. Advise continued masking at all times along with social distancing when outside the home.   I discussed the assessment and treatment plan with the patient. The patient was provided an opportunity to ask questions and all were answered. The patient agreed with the plan and demonstrated an understanding of the instructions.   The patient was advised to call back or seek an in-person evaluation if the symptoms worsen or if the condition fails to improve as anticipated.   Lucretia Kern, DO

## 2018-11-09 DIAGNOSIS — R293 Abnormal posture: Secondary | ICD-10-CM | POA: Diagnosis not present

## 2018-11-09 DIAGNOSIS — M546 Pain in thoracic spine: Secondary | ICD-10-CM | POA: Diagnosis not present

## 2018-11-09 DIAGNOSIS — M2560 Stiffness of unspecified joint, not elsewhere classified: Secondary | ICD-10-CM | POA: Diagnosis not present

## 2018-11-10 DIAGNOSIS — J3089 Other allergic rhinitis: Secondary | ICD-10-CM | POA: Diagnosis not present

## 2018-11-13 DIAGNOSIS — F4311 Post-traumatic stress disorder, acute: Secondary | ICD-10-CM | POA: Diagnosis not present

## 2018-11-16 DIAGNOSIS — M546 Pain in thoracic spine: Secondary | ICD-10-CM | POA: Diagnosis not present

## 2018-11-16 DIAGNOSIS — M2560 Stiffness of unspecified joint, not elsewhere classified: Secondary | ICD-10-CM | POA: Diagnosis not present

## 2018-11-16 DIAGNOSIS — R293 Abnormal posture: Secondary | ICD-10-CM | POA: Diagnosis not present

## 2018-11-17 DIAGNOSIS — J3089 Other allergic rhinitis: Secondary | ICD-10-CM | POA: Diagnosis not present

## 2018-11-20 DIAGNOSIS — F4311 Post-traumatic stress disorder, acute: Secondary | ICD-10-CM | POA: Diagnosis not present

## 2018-11-23 ENCOUNTER — Other Ambulatory Visit: Payer: Self-pay | Admitting: Family Medicine

## 2018-11-23 ENCOUNTER — Other Ambulatory Visit: Payer: Self-pay

## 2018-11-23 ENCOUNTER — Telehealth (INDEPENDENT_AMBULATORY_CARE_PROVIDER_SITE_OTHER): Payer: BC Managed Care – PPO | Admitting: Family Medicine

## 2018-11-23 ENCOUNTER — Telehealth: Payer: Self-pay | Admitting: *Deleted

## 2018-11-23 DIAGNOSIS — R05 Cough: Secondary | ICD-10-CM

## 2018-11-23 DIAGNOSIS — R5383 Other fatigue: Secondary | ICD-10-CM

## 2018-11-23 DIAGNOSIS — J069 Acute upper respiratory infection, unspecified: Secondary | ICD-10-CM | POA: Diagnosis not present

## 2018-11-23 DIAGNOSIS — R059 Cough, unspecified: Secondary | ICD-10-CM

## 2018-11-23 MED ORDER — AZELASTINE HCL 0.1 % NA SOLN: NASAL | 0 refills | Status: DC

## 2018-11-23 MED ORDER — BENZONATATE 100 MG PO CAPS: 100.0000 mg | ORAL_CAPSULE | Freq: Three times a day (TID) | ORAL | 0 refills | Status: DC | PRN

## 2018-11-23 NOTE — Telephone Encounter (Signed)
I called the pt and informed her per office notes saline nasal spray can be purchased over the counter.  Patient stated she was advised by Dr Maudie Mercury to use a different spray for 3 days only?  Message sent to Dr Maudie Mercury.

## 2018-11-23 NOTE — Telephone Encounter (Signed)
Copied from [place] [id number]. Topic: General - Other >> Nov 23, 2018  2:06 PM Pauline Good wrote: Reason for CRM: pt had virtual visit with Dr Maudie Mercury and stated the nasal spray wasn't called into pharmacy

## 2018-11-23 NOTE — Patient Instructions (Addendum)
-  plenty of fluids and rest  -nasal saline  -Tylenol or Aleve per instructions if you needed it  -I sent the medication(s) we discussed to your pharmacy: Meds ordered this encounter  Medications  . benzonatate (TESSALON PERLES) 100 MG capsule    Sig: Take 1 capsule (100 mg total) by mouth 3 (three) times daily as needed.    Dispense:  20 capsule    Refill:  0    Please let us know if you have any questions or concerns regarding this prescription.  I hope you are feeling better soon! Seek care promptly if your symptoms worsen, new concerns arise or you are not improving with treatment.  Self Isolation/Home Quarantine: -see the CDC site for information:   RunningShows.co.za.html   -STAY HOME except for to seek medical care -stay in your own room away from others in your house and use a separate bathroom if possible -Wash hands frequently, disinfect high touch surface areas often, wear a mask if you leave your room and interact as little as possible with others -seek medical care immediately if worsening - call our office for a visit or call ahead if going elsewhere to an urgent care  -seek emergency care if very sick or severe symptoms - call 911 -isolate for at least 10 days from the onset of symptoms PLUS 3 days of no fever PLUS 3 days of improving symptoms  We recommend wearing a mask, social distancing, good hand hygiene and asking others  around you to do the same at all times when around others outside of your home unit throughout the [place] pandemic.    WORK/SLIP SLIP:  Please excuse patient Joyce Cortez,  04-16-1980, from work according to the [facility] guidelines for a COVID like illness. We advise 10 days minimum from the onset of symptoms (11/19/2018) PLUS 1 day of no fever PLUS 1 day of feeling better.  She/He may work remotely from home in self isolation if she is feeling better and wishes to do  so.  Sincerely: E-signature: Dr. Colin Benton, DO [place] Ph: [phone]

## 2018-11-23 NOTE — Telephone Encounter (Signed)
Afrin was the one we talked about. Thanks!

## 2018-11-23 NOTE — Telephone Encounter (Signed)
I called the pt and informed her of the message below.  Patient stated she needs a refill on Azelastine nasal spray as well and she talked with Dr Maudie Mercury about this earlier.  Message sent to Dr Maudie Mercury.

## 2018-11-23 NOTE — Progress Notes (Signed)
Virtual Visit via Video Note  I connected with Joyce Cortez  on 11/23/18 at 12:20 PM EST by a video enabled telemedicine application and verified that I am speaking with the correct person using two identifiers.  Location patient: home Location provider:work or home office Persons participating in the virtual visit: patient, provider  I discussed the limitations of evaluation and management by telemedicine and the availability of in person appointments. The patient expressed understanding and agreed to proceed.   HPI:  Acute visit for COVID19 concerns: -symptoms started 4-5 days ago - she hung out with 4-5 friends over halloween, she did wear a mask - but did take it down to drinking, she was inside during some of this, others were not wearing masks, she tried to socially distance but did not at all times; she goes to classes, she was around a sick coworker who was coughing in the air on the 24th of October - pt had a mask on, coworker did not wear a mask -symptoms include runny nose, mild aches, feels more tired then usually, some cough, some headaches - mild, low grade temp to 99.5, ? wheeze -no fevers, SOB, NVD, loss of taste -she did a rapid test at [facility] that was negative   ROS: See pertinent positives and negatives per HPI.  Past Medical History:  Diagnosis Date  . ADHD (attention deficit hyperactivity disorder)   . Allergy   . Asthma   . Fibromyalgia     Past Surgical History:  Procedure Laterality Date  . WISDOM TOOTH EXTRACTION      Family History  Adopted: Yes    SOCIAL HX: see hpi   Current Outpatient Medications:  .  albuterol (PROVENTIL HFA;VENTOLIN HFA) 108 (90 Base) MCG/ACT inhaler, Inhale 2 puffs into the lungs every 4 (four) hours as needed for wheezing or shortness of breath., Disp: 1 Inhaler, Rfl: 2 .  amoxicillin-clavulanate (AUGMENTIN) 875-125 MG tablet, Take 1 tablet by mouth 2 (two) times daily., Disp: 20 tablet, Rfl: 0 .  [START ON 11/26/2018]  amphetamine-dextroamphetamine (ADDERALL) 20 MG tablet, Take 1 tablet (20 mg total) by mouth 2 (two) times daily., Disp: 60 tablet, Rfl: 0 .  benzonatate (TESSALON PERLES) 100 MG capsule, Take 1 capsule (100 mg total) by mouth 3 (three) times daily as needed., Disp: 20 capsule, Rfl: 0 .  COPPER PO, by Intrauterine route., Disp: , Rfl:  .  fexofenadine (ALLEGRA) 180 MG tablet, Take 180 mg by mouth daily as needed. , Disp: , Rfl:  .  fluticasone (FLONASE) 50 MCG/ACT nasal spray, USE 1 SPRAY INTO EACH NOSTRIL TWICE A DAY, Disp: 16 g, Rfl: 4 .  ibuprofen (ADVIL,MOTRIN) 200 MG tablet, Take 200 mg by mouth every 6 (six) hours as needed., Disp: , Rfl:  .  ketoconazole (NIZORAL) 2 % shampoo, APPLY AS DIRECTED 3 TIMES A WEEK, Disp: , Rfl: 3 .  olopatadine (PATANOL) 0.1 % ophthalmic solution, Place 1 drop into both eyes 2 (two) times daily. (Patient taking differently: Place 1 drop into both eyes as needed. ), Disp: 5 mL, Rfl: 5 .  Turmeric (RA TURMERIC EXTRA STRENGTH) 1053 MG TABS, Take by mouth., Disp: , Rfl:   EXAM:  VITALS per patient if applicable: denies fever  GENERAL: alert, oriented, appears well and in no acute distress  HEENT: atraumatic, conjunttiva clear, no obvious abnormalities on inspection of external nose and ears  NECK: normal movements of the head and neck  LUNGS: on inspection no signs of respiratory distress, breathing rate appears normal,  no obvious gross SOB, gasping or wheezing  CV: no obvious cyanosis  MS: moves all visible extremities without noticeable abnormality  PSYCH/NEURO: pleasant and cooperative, no obvious depression or anxiety, speech and thought processing grossly intact  ASSESSMENT AND PLAN:  Discussed the following assessment and plan:  Upper respiratory tract infection, unspecified type  Cough  Other fatigue  -we discussed possible serious and likely etiologies, options for evaluation and workup, limitations of telemedicine visit vs in person visit,  treatment, treatment risks and precautions. Pt prefers to treat via telemedicine empirically rather then risking or undertaking an in person visit at this moment. She opted for symptomatic home care for likely VURI vs mild influenza vs COVID19 vs other. Sent Tessalon for cough. She agrees to home isolation. Work slip provided. Patient agrees to seek prompt in person care if worsening, new symptoms arise, or if is not improving with treatment.   I discussed the assessment and treatment plan with the patient. The patient was provided an opportunity to ask questions and all were answered. The patient agreed with the plan and demonstrated an understanding of the instructions.   The patient was advised to call back or seek an in-person evaluation if the symptoms worsen or if the condition fails to improve as anticipated.   Terressa Koyanagi, DO

## 2018-11-27 DIAGNOSIS — F4311 Post-traumatic stress disorder, acute: Secondary | ICD-10-CM | POA: Diagnosis not present

## 2018-11-30 DIAGNOSIS — R293 Abnormal posture: Secondary | ICD-10-CM | POA: Diagnosis not present

## 2018-11-30 DIAGNOSIS — M2560 Stiffness of unspecified joint, not elsewhere classified: Secondary | ICD-10-CM | POA: Diagnosis not present

## 2018-11-30 DIAGNOSIS — M546 Pain in thoracic spine: Secondary | ICD-10-CM | POA: Diagnosis not present

## 2018-12-01 DIAGNOSIS — J3089 Other allergic rhinitis: Secondary | ICD-10-CM | POA: Diagnosis not present

## 2018-12-04 DIAGNOSIS — M546 Pain in thoracic spine: Secondary | ICD-10-CM | POA: Diagnosis not present

## 2018-12-04 DIAGNOSIS — R293 Abnormal posture: Secondary | ICD-10-CM | POA: Diagnosis not present

## 2018-12-04 DIAGNOSIS — M2560 Stiffness of unspecified joint, not elsewhere classified: Secondary | ICD-10-CM | POA: Diagnosis not present

## 2018-12-04 DIAGNOSIS — F4311 Post-traumatic stress disorder, acute: Secondary | ICD-10-CM | POA: Diagnosis not present

## 2018-12-07 DIAGNOSIS — M546 Pain in thoracic spine: Secondary | ICD-10-CM | POA: Diagnosis not present

## 2018-12-07 DIAGNOSIS — M2560 Stiffness of unspecified joint, not elsewhere classified: Secondary | ICD-10-CM | POA: Diagnosis not present

## 2018-12-07 DIAGNOSIS — R293 Abnormal posture: Secondary | ICD-10-CM | POA: Diagnosis not present

## 2018-12-08 DIAGNOSIS — J3089 Other allergic rhinitis: Secondary | ICD-10-CM | POA: Diagnosis not present

## 2018-12-11 DIAGNOSIS — R293 Abnormal posture: Secondary | ICD-10-CM | POA: Diagnosis not present

## 2018-12-11 DIAGNOSIS — F4311 Post-traumatic stress disorder, acute: Secondary | ICD-10-CM | POA: Diagnosis not present

## 2018-12-11 DIAGNOSIS — M546 Pain in thoracic spine: Secondary | ICD-10-CM | POA: Diagnosis not present

## 2018-12-11 DIAGNOSIS — M2560 Stiffness of unspecified joint, not elsewhere classified: Secondary | ICD-10-CM | POA: Diagnosis not present

## 2018-12-18 DIAGNOSIS — F4311 Post-traumatic stress disorder, acute: Secondary | ICD-10-CM | POA: Diagnosis not present

## 2018-12-21 DIAGNOSIS — R293 Abnormal posture: Secondary | ICD-10-CM | POA: Diagnosis not present

## 2018-12-21 DIAGNOSIS — M2560 Stiffness of unspecified joint, not elsewhere classified: Secondary | ICD-10-CM | POA: Diagnosis not present

## 2018-12-21 DIAGNOSIS — M546 Pain in thoracic spine: Secondary | ICD-10-CM | POA: Diagnosis not present

## 2018-12-22 DIAGNOSIS — Z516 Encounter for desensitization to allergens: Secondary | ICD-10-CM | POA: Diagnosis not present

## 2018-12-22 DIAGNOSIS — J3089 Other allergic rhinitis: Secondary | ICD-10-CM | POA: Diagnosis not present

## 2018-12-25 DIAGNOSIS — F4311 Post-traumatic stress disorder, acute: Secondary | ICD-10-CM | POA: Diagnosis not present

## 2018-12-28 DIAGNOSIS — R293 Abnormal posture: Secondary | ICD-10-CM | POA: Diagnosis not present

## 2018-12-28 DIAGNOSIS — M2560 Stiffness of unspecified joint, not elsewhere classified: Secondary | ICD-10-CM | POA: Diagnosis not present

## 2018-12-28 DIAGNOSIS — M546 Pain in thoracic spine: Secondary | ICD-10-CM | POA: Diagnosis not present

## 2018-12-29 DIAGNOSIS — J3089 Other allergic rhinitis: Secondary | ICD-10-CM | POA: Diagnosis not present

## 2019-01-01 DIAGNOSIS — F4311 Post-traumatic stress disorder, acute: Secondary | ICD-10-CM | POA: Diagnosis not present

## 2019-01-05 ENCOUNTER — Other Ambulatory Visit: Payer: Self-pay | Admitting: Family Medicine

## 2019-01-05 DIAGNOSIS — J3089 Other allergic rhinitis: Secondary | ICD-10-CM | POA: Diagnosis not present

## 2019-01-05 NOTE — Telephone Encounter (Signed)
Prescribed 11/23/2018 by Dr. Maudie Mercury for respiratory tract infection. Should the patient continue this medication?

## 2019-01-15 DIAGNOSIS — F4311 Post-traumatic stress disorder, acute: Secondary | ICD-10-CM | POA: Diagnosis not present

## 2019-01-16 ENCOUNTER — Telehealth: Payer: Self-pay | Admitting: *Deleted

## 2019-01-16 DIAGNOSIS — M2560 Stiffness of unspecified joint, not elsewhere classified: Secondary | ICD-10-CM | POA: Diagnosis not present

## 2019-01-16 DIAGNOSIS — R293 Abnormal posture: Secondary | ICD-10-CM | POA: Diagnosis not present

## 2019-01-16 DIAGNOSIS — M546 Pain in thoracic spine: Secondary | ICD-10-CM | POA: Diagnosis not present

## 2019-01-16 NOTE — Telephone Encounter (Signed)
Pt stated this urgent care needed a referral from her pcp??  Address is [address] in [place]

## 2019-01-16 NOTE — Telephone Encounter (Signed)
Left message for patient to return call to office.  Copied from [place] [id number]. Topic: General - Other >> Jan 16, 2019  4:36 PM Joyce Cortez wrote: Reason for CRM: Pt stated she currently is living in [place] and she would prefer a referral to [facility] for Covid testing. Pt stated she really does not want to travel to [place] since there are locations near her where she can be tested. Pt requests call back. Cb# [phone]

## 2019-01-17 NOTE — Telephone Encounter (Signed)
Message Routed to PCP CMA 

## 2019-01-17 NOTE — Telephone Encounter (Signed)
Called pt no answer. Call UC for more information on the referral that was needed. After speaking to [facility] staff they advised that they only needed a referral from [facility] pt. A referral is needed for Covid testing for pt that are being admitted for surgery. Outside of that, anyone who is requested to be tested for Covid sx will need to go to their respiratory clinic.

## 2019-01-18 DIAGNOSIS — Z03818 Encounter for observation for suspected exposure to other biological agents ruled out: Secondary | ICD-10-CM | POA: Diagnosis not present

## 2019-01-18 DIAGNOSIS — J3089 Other allergic rhinitis: Secondary | ICD-10-CM | POA: Diagnosis not present

## 2019-01-22 DIAGNOSIS — F4311 Post-traumatic stress disorder, acute: Secondary | ICD-10-CM | POA: Diagnosis not present

## 2019-01-23 DIAGNOSIS — M546 Pain in thoracic spine: Secondary | ICD-10-CM | POA: Diagnosis not present

## 2019-01-23 DIAGNOSIS — M2560 Stiffness of unspecified joint, not elsewhere classified: Secondary | ICD-10-CM | POA: Diagnosis not present

## 2019-01-23 DIAGNOSIS — R293 Abnormal posture: Secondary | ICD-10-CM | POA: Diagnosis not present

## 2019-01-25 DIAGNOSIS — R293 Abnormal posture: Secondary | ICD-10-CM | POA: Diagnosis not present

## 2019-01-25 DIAGNOSIS — M2560 Stiffness of unspecified joint, not elsewhere classified: Secondary | ICD-10-CM | POA: Diagnosis not present

## 2019-01-25 DIAGNOSIS — M546 Pain in thoracic spine: Secondary | ICD-10-CM | POA: Diagnosis not present

## 2019-01-26 DIAGNOSIS — M546 Pain in thoracic spine: Secondary | ICD-10-CM | POA: Diagnosis not present

## 2019-01-26 DIAGNOSIS — M2560 Stiffness of unspecified joint, not elsewhere classified: Secondary | ICD-10-CM | POA: Diagnosis not present

## 2019-01-26 DIAGNOSIS — R293 Abnormal posture: Secondary | ICD-10-CM | POA: Diagnosis not present

## 2019-01-26 DIAGNOSIS — J3089 Other allergic rhinitis: Secondary | ICD-10-CM | POA: Diagnosis not present

## 2019-01-29 DIAGNOSIS — F4311 Post-traumatic stress disorder, acute: Secondary | ICD-10-CM | POA: Diagnosis not present

## 2019-01-31 DIAGNOSIS — M546 Pain in thoracic spine: Secondary | ICD-10-CM | POA: Diagnosis not present

## 2019-01-31 DIAGNOSIS — R293 Abnormal posture: Secondary | ICD-10-CM | POA: Diagnosis not present

## 2019-01-31 DIAGNOSIS — M2560 Stiffness of unspecified joint, not elsewhere classified: Secondary | ICD-10-CM | POA: Diagnosis not present

## 2019-02-01 DIAGNOSIS — M2560 Stiffness of unspecified joint, not elsewhere classified: Secondary | ICD-10-CM | POA: Diagnosis not present

## 2019-02-01 DIAGNOSIS — R293 Abnormal posture: Secondary | ICD-10-CM | POA: Diagnosis not present

## 2019-02-01 DIAGNOSIS — M546 Pain in thoracic spine: Secondary | ICD-10-CM | POA: Diagnosis not present

## 2019-02-02 ENCOUNTER — Telehealth: Payer: Self-pay | Admitting: *Deleted

## 2019-02-02 DIAGNOSIS — Z03818 Encounter for observation for suspected exposure to other biological agents ruled out: Secondary | ICD-10-CM | POA: Diagnosis not present

## 2019-02-02 NOTE — Telephone Encounter (Signed)
Yes please make another copy of that letter and use today's date

## 2019-02-02 NOTE — Telephone Encounter (Signed)
Copied from CRM [id number]. Topic: General - Inquiry >> Feb 02, 2019  9:07 AM Joyce Cortez wrote: Reason for CRM: pt called in and stated that [facility] is needing another letter stating that she needs extra time on her exam due to her ADHD.  She said the letter that was use last time was prefect , she just needs Cortez new one with Cortez updated date.  She stated she would like it put in her mychart  Best number [phone]

## 2019-02-02 NOTE — Telephone Encounter (Signed)
Ok for letter

## 2019-02-05 DIAGNOSIS — F4311 Post-traumatic stress disorder, acute: Secondary | ICD-10-CM | POA: Diagnosis not present

## 2019-02-06 DIAGNOSIS — J309 Allergic rhinitis, unspecified: Secondary | ICD-10-CM | POA: Diagnosis not present

## 2019-02-06 DIAGNOSIS — J3089 Other allergic rhinitis: Secondary | ICD-10-CM | POA: Diagnosis not present

## 2019-02-08 DIAGNOSIS — M546 Pain in thoracic spine: Secondary | ICD-10-CM | POA: Diagnosis not present

## 2019-02-08 DIAGNOSIS — R293 Abnormal posture: Secondary | ICD-10-CM | POA: Diagnosis not present

## 2019-02-08 DIAGNOSIS — M2560 Stiffness of unspecified joint, not elsewhere classified: Secondary | ICD-10-CM | POA: Diagnosis not present

## 2019-02-09 DIAGNOSIS — J3089 Other allergic rhinitis: Secondary | ICD-10-CM | POA: Diagnosis not present

## 2019-02-12 DIAGNOSIS — F4311 Post-traumatic stress disorder, acute: Secondary | ICD-10-CM | POA: Diagnosis not present

## 2019-02-15 DIAGNOSIS — M2560 Stiffness of unspecified joint, not elsewhere classified: Secondary | ICD-10-CM | POA: Diagnosis not present

## 2019-02-15 DIAGNOSIS — M546 Pain in thoracic spine: Secondary | ICD-10-CM | POA: Diagnosis not present

## 2019-02-15 DIAGNOSIS — R293 Abnormal posture: Secondary | ICD-10-CM | POA: Diagnosis not present

## 2019-02-16 DIAGNOSIS — J3089 Other allergic rhinitis: Secondary | ICD-10-CM | POA: Diagnosis not present

## 2019-02-19 DIAGNOSIS — F4311 Post-traumatic stress disorder, acute: Secondary | ICD-10-CM | POA: Diagnosis not present

## 2019-02-22 DIAGNOSIS — Z1159 Encounter for screening for other viral diseases: Secondary | ICD-10-CM | POA: Diagnosis not present

## 2019-02-22 DIAGNOSIS — Z118 Encounter for screening for other infectious and parasitic diseases: Secondary | ICD-10-CM | POA: Diagnosis not present

## 2019-02-22 DIAGNOSIS — N760 Acute vaginitis: Secondary | ICD-10-CM | POA: Diagnosis not present

## 2019-02-22 DIAGNOSIS — Z114 Encounter for screening for human immunodeficiency virus [HIV]: Secondary | ICD-10-CM | POA: Diagnosis not present

## 2019-02-22 DIAGNOSIS — M546 Pain in thoracic spine: Secondary | ICD-10-CM | POA: Diagnosis not present

## 2019-02-22 DIAGNOSIS — R293 Abnormal posture: Secondary | ICD-10-CM | POA: Diagnosis not present

## 2019-02-22 DIAGNOSIS — M2560 Stiffness of unspecified joint, not elsewhere classified: Secondary | ICD-10-CM | POA: Diagnosis not present

## 2019-02-22 DIAGNOSIS — Z113 Encounter for screening for infections with a predominantly sexual mode of transmission: Secondary | ICD-10-CM | POA: Diagnosis not present

## 2019-03-01 DIAGNOSIS — M2560 Stiffness of unspecified joint, not elsewhere classified: Secondary | ICD-10-CM | POA: Diagnosis not present

## 2019-03-01 DIAGNOSIS — M546 Pain in thoracic spine: Secondary | ICD-10-CM | POA: Diagnosis not present

## 2019-03-01 DIAGNOSIS — R293 Abnormal posture: Secondary | ICD-10-CM | POA: Diagnosis not present

## 2019-03-02 DIAGNOSIS — J3089 Other allergic rhinitis: Secondary | ICD-10-CM | POA: Diagnosis not present

## 2019-03-02 DIAGNOSIS — Z516 Encounter for desensitization to allergens: Secondary | ICD-10-CM | POA: Diagnosis not present

## 2019-03-02 DIAGNOSIS — F4311 Post-traumatic stress disorder, acute: Secondary | ICD-10-CM | POA: Diagnosis not present

## 2019-03-08 ENCOUNTER — Telehealth: Payer: Self-pay | Admitting: *Deleted

## 2019-03-08 NOTE — Telephone Encounter (Signed)
Crystal called after hours line. Crystal reports   the doctor called in a Rx for metronidazole , but they have not received it.

## 2019-03-09 DIAGNOSIS — J3089 Other allergic rhinitis: Secondary | ICD-10-CM | POA: Diagnosis not present

## 2019-03-09 NOTE — Telephone Encounter (Signed)
Please advise. I do not see this on the med list? °

## 2019-03-09 NOTE — Telephone Encounter (Signed)
We have never given her Metronidazole. What is this for? Could she have gotten it from a dernatologist?

## 2019-03-12 DIAGNOSIS — F4311 Post-traumatic stress disorder, acute: Secondary | ICD-10-CM | POA: Diagnosis not present

## 2019-03-15 DIAGNOSIS — R293 Abnormal posture: Secondary | ICD-10-CM | POA: Diagnosis not present

## 2019-03-15 DIAGNOSIS — M2560 Stiffness of unspecified joint, not elsewhere classified: Secondary | ICD-10-CM | POA: Diagnosis not present

## 2019-03-15 DIAGNOSIS — M546 Pain in thoracic spine: Secondary | ICD-10-CM | POA: Diagnosis not present

## 2019-03-16 ENCOUNTER — Telehealth (INDEPENDENT_AMBULATORY_CARE_PROVIDER_SITE_OTHER): Payer: BC Managed Care – PPO | Admitting: Family Medicine

## 2019-03-16 ENCOUNTER — Other Ambulatory Visit: Payer: Self-pay

## 2019-03-16 DIAGNOSIS — F900 Attention-deficit hyperactivity disorder, predominantly inattentive type: Secondary | ICD-10-CM | POA: Diagnosis not present

## 2019-03-16 DIAGNOSIS — F4311 Post-traumatic stress disorder, acute: Secondary | ICD-10-CM | POA: Diagnosis not present

## 2019-03-16 DIAGNOSIS — F419 Anxiety disorder, unspecified: Secondary | ICD-10-CM

## 2019-03-16 DIAGNOSIS — J3089 Other allergic rhinitis: Secondary | ICD-10-CM | POA: Diagnosis not present

## 2019-03-16 MED ORDER — AMPHETAMINE-DEXTROAMPHETAMINE 20 MG PO TABS: 20.0000 mg | ORAL_TABLET | Freq: Two times a day (BID) | ORAL | 0 refills | Status: DC

## 2019-03-16 MED ORDER — ALPRAZOLAM ER 0.5 MG PO TB24: 0.5000 mg | ORAL_TABLET | Freq: Two times a day (BID) | ORAL | 2 refills | Status: DC | PRN

## 2019-03-16 NOTE — Progress Notes (Signed)
Virtual Visit via Video Note  I connected with the patient on 03/16/19 at 11:15 AM EST by a video enabled telemedicine application and verified that I am speaking with the correct person using two identifiers.  Location patient: home Location provider:work or home office Persons participating in the virtual visit: patient, provider  I discussed the limitations of evaluation and management by telemedicine and the availability of in person appointments. The patient expressed understanding and agreed to proceed.   HPI: Here asking advice about ADHD and anxiety. She takes Adderal as needed on days she has school work to do. Unfortunately this also increases her anxiety, so she has been taking Lorazepam along with it. This helps, but the effects wear off too soon and then her anxiety goes through the roof. She sleeps well at night.    ROS: See pertinent positives and negatives per HPI.  Past Medical History:  Diagnosis Date  . ADHD (attention deficit hyperactivity disorder)   . Allergy   . Asthma   . Fibromyalgia     Past Surgical History:  Procedure Laterality Date  . WISDOM TOOTH EXTRACTION      Family History  Adopted: Yes     Current Outpatient Medications:  .  albuterol (PROVENTIL HFA;VENTOLIN HFA) 108 (90 Base) MCG/ACT inhaler, Inhale 2 puffs into the lungs every 4 (four) hours as needed for wheezing or shortness of breath., Disp: 1 Inhaler, Rfl: 2 .  amoxicillin-clavulanate (AUGMENTIN) 875-125 MG tablet, Take 1 tablet by mouth 2 (two) times daily., Disp: 20 tablet, Rfl: 0 .  amphetamine-dextroamphetamine (ADDERALL) 20 MG tablet, Take 1 tablet (20 mg total) by mouth 2 (two) times daily., Disp: 60 tablet, Rfl: 0 .  azelastine (ASTELIN) 0.1 % nasal spray, PLACE 1 SPRAY IN EACH NOSTRIL 2 TIMES DAILY, Disp: 30 mL, Rfl: 11 .  benzonatate (TESSALON PERLES) 100 MG capsule, Take 1 capsule (100 mg total) by mouth 3 (three) times daily as needed., Disp: 20 capsule, Rfl: 0 .  COPPER PO,  by Intrauterine route., Disp: , Rfl:  .  fexofenadine (ALLEGRA) 180 MG tablet, Take 180 mg by mouth daily as needed. , Disp: , Rfl:  .  fluticasone (FLONASE) 50 MCG/ACT nasal spray, USE 1 SPRAY INTO EACH NOSTRIL TWICE A DAY, Disp: 16 g, Rfl: 4 .  ibuprofen (ADVIL,MOTRIN) 200 MG tablet, Take 200 mg by mouth every 6 (six) hours as needed., Disp: , Rfl:  .  ketoconazole (NIZORAL) 2 % shampoo, APPLY AS DIRECTED 3 TIMES A WEEK, Disp: , Rfl: 3 .  olopatadine (PATANOL) 0.1 % ophthalmic solution, Place 1 drop into both eyes 2 (two) times daily. (Patient taking differently: Place 1 drop into both eyes as needed. ), Disp: 5 mL, Rfl: 5 .  Turmeric (RA TURMERIC EXTRA STRENGTH) 1053 MG TABS, Take by mouth., Disp: , Rfl:   EXAM:  VITALS per patient if applicable:  GENERAL: alert, oriented, appears well and in no acute distress  HEENT: atraumatic, conjunttiva clear, no obvious abnormalities on inspection of external nose and ears  NECK: normal movements of the head and neck  LUNGS: on inspection no signs of respiratory distress, breathing rate appears normal, no obvious gross SOB, gasping or wheezing  CV: no obvious cyanosis  MS: moves all visible extremities without noticeable abnormality  PSYCH/NEURO: pleasant and cooperative, no obvious depression or anxiety, speech and thought processing grossly intact  ASSESSMENT AND PLAN: For the ADHD she will continue to use Adderall as needed. fpr the anxiety she will try Xanax XR  as needed. Recheck prn. Gershon Crane, MD  Discussed the following assessment and plan:  No diagnosis found.     I discussed the assessment and treatment plan with the patient. The patient was provided an opportunity to ask questions and all were answered. The patient agreed with the plan and demonstrated an understanding of the instructions.   The patient was advised to call back or seek an in-person evaluation if the symptoms worsen or if the condition fails to improve as  anticipated.

## 2019-03-23 DIAGNOSIS — J3089 Other allergic rhinitis: Secondary | ICD-10-CM | POA: Diagnosis not present

## 2019-03-23 DIAGNOSIS — F4311 Post-traumatic stress disorder, acute: Secondary | ICD-10-CM | POA: Diagnosis not present

## 2019-03-27 DIAGNOSIS — F4311 Post-traumatic stress disorder, acute: Secondary | ICD-10-CM | POA: Diagnosis not present

## 2019-03-29 DIAGNOSIS — M2560 Stiffness of unspecified joint, not elsewhere classified: Secondary | ICD-10-CM | POA: Diagnosis not present

## 2019-03-29 DIAGNOSIS — M546 Pain in thoracic spine: Secondary | ICD-10-CM | POA: Diagnosis not present

## 2019-03-29 DIAGNOSIS — R293 Abnormal posture: Secondary | ICD-10-CM | POA: Diagnosis not present

## 2019-03-30 DIAGNOSIS — J3089 Other allergic rhinitis: Secondary | ICD-10-CM | POA: Diagnosis not present

## 2019-04-02 DIAGNOSIS — F4311 Post-traumatic stress disorder, acute: Secondary | ICD-10-CM | POA: Diagnosis not present

## 2019-04-06 DIAGNOSIS — J3089 Other allergic rhinitis: Secondary | ICD-10-CM | POA: Diagnosis not present

## 2019-04-06 DIAGNOSIS — Z516 Encounter for desensitization to allergens: Secondary | ICD-10-CM | POA: Diagnosis not present

## 2019-04-06 DIAGNOSIS — Z79899 Other long term (current) drug therapy: Secondary | ICD-10-CM | POA: Diagnosis not present

## 2019-04-09 DIAGNOSIS — J3089 Other allergic rhinitis: Secondary | ICD-10-CM | POA: Diagnosis not present

## 2019-04-09 DIAGNOSIS — F4311 Post-traumatic stress disorder, acute: Secondary | ICD-10-CM | POA: Diagnosis not present

## 2019-04-09 DIAGNOSIS — Z20828 Contact with and (suspected) exposure to other viral communicable diseases: Secondary | ICD-10-CM | POA: Diagnosis not present

## 2019-04-12 DIAGNOSIS — M546 Pain in thoracic spine: Secondary | ICD-10-CM | POA: Diagnosis not present

## 2019-04-12 DIAGNOSIS — R293 Abnormal posture: Secondary | ICD-10-CM | POA: Diagnosis not present

## 2019-04-12 DIAGNOSIS — M2560 Stiffness of unspecified joint, not elsewhere classified: Secondary | ICD-10-CM | POA: Diagnosis not present

## 2019-04-13 DIAGNOSIS — J3089 Other allergic rhinitis: Secondary | ICD-10-CM | POA: Diagnosis not present

## 2019-04-17 DIAGNOSIS — L859 Epidermal thickening, unspecified: Secondary | ICD-10-CM | POA: Diagnosis not present

## 2019-04-17 DIAGNOSIS — M2560 Stiffness of unspecified joint, not elsewhere classified: Secondary | ICD-10-CM | POA: Diagnosis not present

## 2019-04-17 DIAGNOSIS — R293 Abnormal posture: Secondary | ICD-10-CM | POA: Diagnosis not present

## 2019-04-17 DIAGNOSIS — M546 Pain in thoracic spine: Secondary | ICD-10-CM | POA: Diagnosis not present

## 2019-04-20 DIAGNOSIS — Z516 Encounter for desensitization to allergens: Secondary | ICD-10-CM | POA: Diagnosis not present

## 2019-04-20 DIAGNOSIS — J3089 Other allergic rhinitis: Secondary | ICD-10-CM | POA: Diagnosis not present

## 2019-04-20 DIAGNOSIS — F4311 Post-traumatic stress disorder, acute: Secondary | ICD-10-CM | POA: Diagnosis not present

## 2019-04-27 DIAGNOSIS — J3089 Other allergic rhinitis: Secondary | ICD-10-CM | POA: Diagnosis not present

## 2019-04-27 DIAGNOSIS — F4311 Post-traumatic stress disorder, acute: Secondary | ICD-10-CM | POA: Diagnosis not present

## 2019-05-02 DIAGNOSIS — R293 Abnormal posture: Secondary | ICD-10-CM | POA: Diagnosis not present

## 2019-05-02 DIAGNOSIS — M2560 Stiffness of unspecified joint, not elsewhere classified: Secondary | ICD-10-CM | POA: Diagnosis not present

## 2019-05-02 DIAGNOSIS — M546 Pain in thoracic spine: Secondary | ICD-10-CM | POA: Diagnosis not present

## 2019-05-04 DIAGNOSIS — F4311 Post-traumatic stress disorder, acute: Secondary | ICD-10-CM | POA: Diagnosis not present

## 2019-05-11 ENCOUNTER — Encounter: Payer: Self-pay | Admitting: Family Medicine

## 2019-05-11 ENCOUNTER — Telehealth (INDEPENDENT_AMBULATORY_CARE_PROVIDER_SITE_OTHER): Payer: BC Managed Care – PPO | Admitting: Family Medicine

## 2019-05-11 DIAGNOSIS — F4311 Post-traumatic stress disorder, acute: Secondary | ICD-10-CM | POA: Diagnosis not present

## 2019-05-11 DIAGNOSIS — J019 Acute sinusitis, unspecified: Secondary | ICD-10-CM | POA: Diagnosis not present

## 2019-05-11 DIAGNOSIS — J302 Other seasonal allergic rhinitis: Secondary | ICD-10-CM

## 2019-05-11 DIAGNOSIS — Z7189 Other specified counseling: Secondary | ICD-10-CM | POA: Diagnosis not present

## 2019-05-11 MED ORDER — AMOXICILLIN-POT CLAVULANATE 500-125 MG PO TABS: 1.0000 | ORAL_TABLET | Freq: Two times a day (BID) | ORAL | 0 refills | Status: AC

## 2019-05-11 MED ORDER — LEVOCETIRIZINE DIHYDROCHLORIDE 5 MG PO TABS: 5.0000 mg | ORAL_TABLET | Freq: Every evening | ORAL | 0 refills | Status: DC

## 2019-05-11 NOTE — Progress Notes (Signed)
Virtual Visit via Telephone Note  I connected with Joyce Cortez on 05/11/19 at  9:30 AM EDT by telephone and verified that I am speaking with the correct person using two identifiers.   I discussed the limitations, risks, security and privacy concerns of performing an evaluation and management service by telephone and the availability of in person appointments. I also discussed with the patient that there may be a patient responsible charge related to this service. The patient expressed understanding and agreed to proceed.  Location patient: home Location provider: work or home office Participants present for the call: patient, provider Patient did not have a visit in the prior 7 days to address this/these issue(s).   History of Present Illness: Pt is a 39 yo female with pmh sig for anxiety, asthma, eczema, GERD, h/o depression, fibromyalgia, ADHD, allergies followed by Dr. Clent Ridges.   Pt seen for "severe allergy symptoms" x 1 month.  Notes worse after leaving windows open at home.  Receiving allergy shots, but cancelled appt this wk b/c of symptoms.  Pt endodrses stabbing pain in stomach, HAs, dry eyes, occasional rhinorrhea, scratchy throat, dry cough.  Denies fever, ear pain or pressure, facial pain or pressure, nausea, diarrhea, diarrhea, sick contacts.  Using a Netti pot, Azelastine, flonase, bendadryl, and Allegra.  Increasing water intake as feels "dry".  In the past Claritin and zyrtec did not work.  Pt does not think she has COVID-19 as her partner was tested a few weeks ago and negative.   Observations/Objective: Patient sounds cheerful and well on the phone. I do not appreciate any SOB. Speech and thought processing are grossly intact. Patient reported vitals:  Assessment and Plan: Seasonal allergies -discussed trying a different allergy med as has been on Allegra x several yrs and may have some tolerance. -will start xyzal. -d/c frequent Benadryl use as likely causing/worsening  dry symptoms. -continue Nettie pot, azelastine, Flonase. -Consider local honey  - Plan: levocetirizine (XYZAL) 5 MG tablet  Subacute sinusitis, unspecified location  - Plan: amoxicillin-clavulanate (AUGMENTIN) 500-125 MG tablet  Educated about COVID-19 virus infection -Discussed S/S of COVID-19 infection -Consider Covid testing for continued or worsening symptoms -Given precautions   Follow Up Instructions: F/u prn with pcp  I did not refer this patient for an OV in the next 24 hours for this/these issue(s).  I discussed the assessment and treatment plan with the patient. The patient was provided an opportunity to ask questions and all were answered. The patient agreed with the plan and demonstrated an understanding of the instructions.   The patient was advised to call back or seek an in-person evaluation if the symptoms worsen or if the condition fails to improve as anticipated.  I provided 15 minutes of non-face-to-face time during this encounter.   Deeann Saint, MD

## 2019-05-15 DIAGNOSIS — M546 Pain in thoracic spine: Secondary | ICD-10-CM | POA: Diagnosis not present

## 2019-05-15 DIAGNOSIS — R293 Abnormal posture: Secondary | ICD-10-CM | POA: Diagnosis not present

## 2019-05-15 DIAGNOSIS — M2560 Stiffness of unspecified joint, not elsewhere classified: Secondary | ICD-10-CM | POA: Diagnosis not present

## 2019-05-15 DIAGNOSIS — F4311 Post-traumatic stress disorder, acute: Secondary | ICD-10-CM | POA: Diagnosis not present

## 2019-05-22 ENCOUNTER — Encounter: Payer: Self-pay | Admitting: Family Medicine

## 2019-05-22 ENCOUNTER — Telehealth (INDEPENDENT_AMBULATORY_CARE_PROVIDER_SITE_OTHER): Payer: BC Managed Care – PPO | Admitting: Family Medicine

## 2019-05-22 DIAGNOSIS — J3089 Other allergic rhinitis: Secondary | ICD-10-CM

## 2019-05-22 DIAGNOSIS — F4311 Post-traumatic stress disorder, acute: Secondary | ICD-10-CM | POA: Diagnosis not present

## 2019-05-22 MED ORDER — METHYLPREDNISOLONE 4 MG PO TBPK: ORAL_TABLET | ORAL | 0 refills | Status: DC

## 2019-05-22 MED ORDER — OLOPATADINE HCL 0.1 % OP SOLN: 1.0000 [drp] | Freq: Two times a day (BID) | OPHTHALMIC | 11 refills | Status: DC

## 2019-05-22 MED ORDER — MONTELUKAST SODIUM 10 MG PO TABS: 10.0000 mg | ORAL_TABLET | Freq: Every day | ORAL | 3 refills | Status: DC

## 2019-05-22 NOTE — Progress Notes (Signed)
Subjective:    Patient ID: Joyce Cortez, female    DOB: Feb 07, 1980, 39 y.o.   MRN: [id number]  HPI Virtual Visit via Video Note  I connected with the patient on 05/22/19 at  1:00 PM EDT by a video enabled telemedicine application and verified that I am speaking with the correct person using two identifiers.  Location patient: home Location provider:work or home office Persons participating in the virtual visit: patient, provider  I discussed the limitations of evaluation and management by telemedicine and the availability of in person appointments. The patient expressed understanding and agreed to proceed.   HPI: Here for worsening allergies during the pollen season. This causes itchy swollen eyes, PND, sneezing, and coughing. No fever. On Xyzal and Flonase.    ROS: See pertinent positives and negatives per HPI.  Past Medical History:  Diagnosis Date  . ADHD (attention deficit hyperactivity disorder)   . Allergy   . Asthma   . Fibromyalgia     Past Surgical History:  Procedure Laterality Date  . WISDOM TOOTH EXTRACTION      Family History  Adopted: Yes     Current Outpatient Medications:  .  albuterol (PROVENTIL HFA;VENTOLIN HFA) 108 (90 Base) MCG/ACT inhaler, Inhale 2 puffs into the lungs every 4 (four) hours as needed for wheezing or shortness of breath., Disp: 1 Inhaler, Rfl: 2 .  ALPRAZolam (XANAX XR) 0.5 MG 24 hr tablet, Take 1 tablet (0.5 mg total) by mouth 2 (two) times daily as needed for anxiety., Disp: 60 tablet, Rfl: 2 .  amphetamine-dextroamphetamine (ADDERALL) 20 MG tablet, Take 1 tablet (20 mg total) by mouth 2 (two) times daily., Disp: 60 tablet, Rfl: 0 .  azelastine (ASTELIN) 0.1 % nasal spray, PLACE 1 SPRAY IN EACH NOSTRIL 2 TIMES DAILY, Disp: 30 mL, Rfl: 11 .  COPPER PO, by Intrauterine route., Disp: , Rfl:  .  fexofenadine (ALLEGRA) 180 MG tablet, Take 180 mg by mouth daily as needed. , Disp: , Rfl:  .  fluticasone (FLONASE) 50 MCG/ACT nasal  spray, USE 1 SPRAY INTO EACH NOSTRIL TWICE A DAY, Disp: 16 g, Rfl: 4 .  ibuprofen (ADVIL,MOTRIN) 200 MG tablet, Take 200 mg by mouth every 6 (six) hours as needed., Disp: , Rfl:  .  ketoconazole (NIZORAL) 2 % shampoo, APPLY AS DIRECTED 3 TIMES A WEEK, Disp: , Rfl: 3 .  levocetirizine (XYZAL) 5 MG tablet, Take 1 tablet (5 mg total) by mouth every evening., Disp: 30 tablet, Rfl: 0 .  olopatadine (PATANOL) 0.1 % ophthalmic solution, Place 1 drop into both eyes 2 (two) times daily. (Patient taking differently: Place 1 drop into both eyes as needed. ), Disp: 5 mL, Rfl: 5 .  Turmeric (RA TURMERIC EXTRA STRENGTH) 1053 MG TABS, Take by mouth., Disp: , Rfl:   EXAM:  VITALS per patient if applicable:  GENERAL: alert, oriented, appears well and in no acute distress  HEENT: atraumatic, conjunttiva clear, no obvious abnormalities on inspection of external nose and ears  NECK: normal movements of the head and neck  LUNGS: on inspection no signs of respiratory distress, breathing rate appears normal, no obvious gross SOB, gasping or wheezing  CV: no obvious cyanosis  MS: moves all visible extremities without noticeable abnormality  PSYCH/NEURO: pleasant and cooperative, no obvious depression or anxiety, speech and thought processing grossly intact  ASSESSMENT AND PLAN: Allergies, add Patanol drops prn and a Medrol dose pack. Add Singulair daily.  Alysia Penna, MD  Discussed the following assessment and plan:  No diagnosis found.     I discussed the assessment and treatment plan with the patient. The patient was provided an opportunity to ask questions and all were answered. The patient agreed with the plan and demonstrated an understanding of the instructions.   The patient was advised to call back or seek an in-person evaluation if the symptoms worsen or if the condition fails to improve as anticipated.     Review of Systems     Objective:   Physical Exam        Assessment &  Plan:

## 2019-05-23 DIAGNOSIS — J3089 Other allergic rhinitis: Secondary | ICD-10-CM | POA: Diagnosis not present

## 2019-05-25 DIAGNOSIS — J3089 Other allergic rhinitis: Secondary | ICD-10-CM | POA: Diagnosis not present

## 2019-05-28 DIAGNOSIS — R293 Abnormal posture: Secondary | ICD-10-CM | POA: Diagnosis not present

## 2019-05-28 DIAGNOSIS — M546 Pain in thoracic spine: Secondary | ICD-10-CM | POA: Diagnosis not present

## 2019-05-28 DIAGNOSIS — M2560 Stiffness of unspecified joint, not elsewhere classified: Secondary | ICD-10-CM | POA: Diagnosis not present

## 2019-05-29 DIAGNOSIS — F4311 Post-traumatic stress disorder, acute: Secondary | ICD-10-CM | POA: Diagnosis not present

## 2019-05-29 DIAGNOSIS — J3089 Other allergic rhinitis: Secondary | ICD-10-CM | POA: Diagnosis not present

## 2019-05-30 DIAGNOSIS — J3089 Other allergic rhinitis: Secondary | ICD-10-CM | POA: Diagnosis not present

## 2019-06-01 DIAGNOSIS — M546 Pain in thoracic spine: Secondary | ICD-10-CM | POA: Diagnosis not present

## 2019-06-01 DIAGNOSIS — R293 Abnormal posture: Secondary | ICD-10-CM | POA: Diagnosis not present

## 2019-06-01 DIAGNOSIS — M2560 Stiffness of unspecified joint, not elsewhere classified: Secondary | ICD-10-CM | POA: Diagnosis not present

## 2019-06-01 DIAGNOSIS — J3089 Other allergic rhinitis: Secondary | ICD-10-CM | POA: Diagnosis not present

## 2019-06-05 DIAGNOSIS — F4311 Post-traumatic stress disorder, acute: Secondary | ICD-10-CM | POA: Diagnosis not present

## 2019-06-06 ENCOUNTER — Other Ambulatory Visit: Payer: Self-pay | Admitting: Family Medicine

## 2019-06-06 DIAGNOSIS — J302 Other seasonal allergic rhinitis: Secondary | ICD-10-CM

## 2019-06-06 NOTE — Telephone Encounter (Signed)
Dr Fry pt 

## 2019-06-07 NOTE — Telephone Encounter (Signed)
Last filled by Dr. Salomon Fick. Ok to fill?

## 2019-06-08 DIAGNOSIS — J3089 Other allergic rhinitis: Secondary | ICD-10-CM | POA: Diagnosis not present

## 2019-06-11 DIAGNOSIS — M546 Pain in thoracic spine: Secondary | ICD-10-CM | POA: Diagnosis not present

## 2019-06-11 DIAGNOSIS — R293 Abnormal posture: Secondary | ICD-10-CM | POA: Diagnosis not present

## 2019-06-11 DIAGNOSIS — M2560 Stiffness of unspecified joint, not elsewhere classified: Secondary | ICD-10-CM | POA: Diagnosis not present

## 2019-06-12 DIAGNOSIS — J3089 Other allergic rhinitis: Secondary | ICD-10-CM | POA: Diagnosis not present

## 2019-06-12 DIAGNOSIS — F4311 Post-traumatic stress disorder, acute: Secondary | ICD-10-CM | POA: Diagnosis not present

## 2019-06-15 DIAGNOSIS — J3089 Other allergic rhinitis: Secondary | ICD-10-CM | POA: Diagnosis not present

## 2019-06-19 DIAGNOSIS — J3089 Other allergic rhinitis: Secondary | ICD-10-CM | POA: Diagnosis not present

## 2019-06-20 DIAGNOSIS — F4311 Post-traumatic stress disorder, acute: Secondary | ICD-10-CM | POA: Diagnosis not present

## 2019-06-21 DIAGNOSIS — R293 Abnormal posture: Secondary | ICD-10-CM | POA: Diagnosis not present

## 2019-06-21 DIAGNOSIS — M546 Pain in thoracic spine: Secondary | ICD-10-CM | POA: Diagnosis not present

## 2019-06-21 DIAGNOSIS — M2560 Stiffness of unspecified joint, not elsewhere classified: Secondary | ICD-10-CM | POA: Diagnosis not present

## 2019-06-29 DIAGNOSIS — J3089 Other allergic rhinitis: Secondary | ICD-10-CM | POA: Diagnosis not present

## 2019-07-02 DIAGNOSIS — R300 Dysuria: Secondary | ICD-10-CM | POA: Diagnosis not present

## 2019-07-02 DIAGNOSIS — Z3009 Encounter for other general counseling and advice on contraception: Secondary | ICD-10-CM | POA: Diagnosis not present

## 2019-07-03 DIAGNOSIS — F4311 Post-traumatic stress disorder, acute: Secondary | ICD-10-CM | POA: Diagnosis not present

## 2019-07-04 DIAGNOSIS — M546 Pain in thoracic spine: Secondary | ICD-10-CM | POA: Diagnosis not present

## 2019-07-04 DIAGNOSIS — M2560 Stiffness of unspecified joint, not elsewhere classified: Secondary | ICD-10-CM | POA: Diagnosis not present

## 2019-07-04 DIAGNOSIS — R293 Abnormal posture: Secondary | ICD-10-CM | POA: Diagnosis not present

## 2019-07-09 DIAGNOSIS — M546 Pain in thoracic spine: Secondary | ICD-10-CM | POA: Diagnosis not present

## 2019-07-09 DIAGNOSIS — M2560 Stiffness of unspecified joint, not elsewhere classified: Secondary | ICD-10-CM | POA: Diagnosis not present

## 2019-07-09 DIAGNOSIS — R293 Abnormal posture: Secondary | ICD-10-CM | POA: Diagnosis not present

## 2019-07-10 DIAGNOSIS — F4311 Post-traumatic stress disorder, acute: Secondary | ICD-10-CM | POA: Diagnosis not present

## 2019-07-10 DIAGNOSIS — J3089 Other allergic rhinitis: Secondary | ICD-10-CM | POA: Diagnosis not present

## 2019-07-11 DIAGNOSIS — R293 Abnormal posture: Secondary | ICD-10-CM | POA: Diagnosis not present

## 2019-07-11 DIAGNOSIS — M546 Pain in thoracic spine: Secondary | ICD-10-CM | POA: Diagnosis not present

## 2019-07-11 DIAGNOSIS — M2560 Stiffness of unspecified joint, not elsewhere classified: Secondary | ICD-10-CM | POA: Diagnosis not present

## 2019-07-12 DIAGNOSIS — Z79899 Other long term (current) drug therapy: Secondary | ICD-10-CM | POA: Diagnosis not present

## 2019-07-12 DIAGNOSIS — Z516 Encounter for desensitization to allergens: Secondary | ICD-10-CM | POA: Diagnosis not present

## 2019-07-12 DIAGNOSIS — J3089 Other allergic rhinitis: Secondary | ICD-10-CM | POA: Diagnosis not present

## 2019-07-17 DIAGNOSIS — F4311 Post-traumatic stress disorder, acute: Secondary | ICD-10-CM | POA: Diagnosis not present

## 2019-07-17 DIAGNOSIS — J3089 Other allergic rhinitis: Secondary | ICD-10-CM | POA: Diagnosis not present

## 2019-07-20 DIAGNOSIS — F4311 Post-traumatic stress disorder, acute: Secondary | ICD-10-CM | POA: Diagnosis not present

## 2019-07-24 DIAGNOSIS — J3089 Other allergic rhinitis: Secondary | ICD-10-CM | POA: Diagnosis not present

## 2019-07-24 DIAGNOSIS — M546 Pain in thoracic spine: Secondary | ICD-10-CM | POA: Diagnosis not present

## 2019-07-24 DIAGNOSIS — F4311 Post-traumatic stress disorder, acute: Secondary | ICD-10-CM | POA: Diagnosis not present

## 2019-07-24 DIAGNOSIS — R293 Abnormal posture: Secondary | ICD-10-CM | POA: Diagnosis not present

## 2019-07-24 DIAGNOSIS — Z3009 Encounter for other general counseling and advice on contraception: Secondary | ICD-10-CM | POA: Diagnosis not present

## 2019-07-24 DIAGNOSIS — M2560 Stiffness of unspecified joint, not elsewhere classified: Secondary | ICD-10-CM | POA: Diagnosis not present

## 2019-07-27 DIAGNOSIS — R293 Abnormal posture: Secondary | ICD-10-CM | POA: Diagnosis not present

## 2019-07-27 DIAGNOSIS — M2560 Stiffness of unspecified joint, not elsewhere classified: Secondary | ICD-10-CM | POA: Diagnosis not present

## 2019-07-27 DIAGNOSIS — M546 Pain in thoracic spine: Secondary | ICD-10-CM | POA: Diagnosis not present

## 2019-07-29 DIAGNOSIS — S61412A Laceration without foreign body of left hand, initial encounter: Secondary | ICD-10-CM | POA: Diagnosis not present

## 2019-07-31 DIAGNOSIS — Z516 Encounter for desensitization to allergens: Secondary | ICD-10-CM | POA: Diagnosis not present

## 2019-07-31 DIAGNOSIS — J3089 Other allergic rhinitis: Secondary | ICD-10-CM | POA: Diagnosis not present

## 2019-07-31 DIAGNOSIS — Z79899 Other long term (current) drug therapy: Secondary | ICD-10-CM | POA: Diagnosis not present

## 2019-07-31 DIAGNOSIS — F4311 Post-traumatic stress disorder, acute: Secondary | ICD-10-CM | POA: Diagnosis not present

## 2019-08-01 ENCOUNTER — Other Ambulatory Visit: Payer: Self-pay

## 2019-08-01 ENCOUNTER — Ambulatory Visit (INDEPENDENT_AMBULATORY_CARE_PROVIDER_SITE_OTHER): Payer: BC Managed Care – PPO | Admitting: Family Medicine

## 2019-08-01 ENCOUNTER — Encounter: Payer: Self-pay | Admitting: Family Medicine

## 2019-08-01 VITALS — BP 124/60 | HR 101 | Temp 98.5°F | Wt 171.6 lb

## 2019-08-01 DIAGNOSIS — S61412D Laceration without foreign body of left hand, subsequent encounter: Secondary | ICD-10-CM

## 2019-08-01 NOTE — Progress Notes (Signed)
   Subjective:    Patient ID: Joyce Cortez, female    DOB: 11-Jul-1980, 39 y.o.   MRN: [id number]  HPI Here to follow up an urgent care visit on 07-28-19 in [place]. She was at home washing a martini glass when it broke in her left hand. At the clinic an Xray was done to rule out any glass fragments, and then the wound was closed with steri strips. Since then the area has been very tender but it is not red, no drainage has been seen. Her last tetanus booster was in 2017.    Review of Systems  Constitutional: Negative.   Respiratory: Negative.   Cardiovascular: Negative.   Skin: Positive for wound.       Objective:   Physical Exam Constitutional:      Appearance: Normal appearance.  Cardiovascular:     Rate and Rhythm: Normal rate and regular rhythm.     Pulses: Normal pulses.     Heart sounds: Normal heart sounds.  Pulmonary:     Effort: Pulmonary effort is normal.     Breath sounds: Normal breath sounds.  Skin:    Comments: The left palm has some steri strips in place which contain clotted blood. The wound is not visible but there are no signs of infection. All fingers have full ROM and sensation is intact.   Neurological:     Mental Status: She is alert.           Assessment & Plan:  Laceration, it appears to be healing as expected. We will wrap gauze around the hand to protect the steri strips. They should fall off on their own in a few more days. Recheck prn. Gershon Crane, MD

## 2019-08-02 ENCOUNTER — Telehealth: Payer: Self-pay | Admitting: Family Medicine

## 2019-08-02 ENCOUNTER — Other Ambulatory Visit: Payer: Self-pay | Admitting: Family Medicine

## 2019-08-02 DIAGNOSIS — J3089 Other allergic rhinitis: Secondary | ICD-10-CM | POA: Diagnosis not present

## 2019-08-02 NOTE — Telephone Encounter (Signed)
Pt call and want a antibiotics call in at  [id number] - [place], [place] - [address] AT [place] [address] Phone:  [phone]  Fax:  [phone]    Would like to get it today if you can.Marland Kitchen

## 2019-08-03 MED ORDER — CEPHALEXIN 500 MG PO CAPS
500.0000 mg | ORAL_CAPSULE | Freq: Three times a day (TID) | ORAL | 0 refills | Status: DC
Start: 2019-08-03 — End: 2020-07-28

## 2019-08-03 NOTE — Telephone Encounter (Signed)
Call in Keflex 500 mg TID for 10 days  

## 2019-08-03 NOTE — Telephone Encounter (Signed)
Rx sent in. Spoke with the patient. She is aware that this has been sent in for her.

## 2019-08-06 DIAGNOSIS — M2560 Stiffness of unspecified joint, not elsewhere classified: Secondary | ICD-10-CM | POA: Diagnosis not present

## 2019-08-06 DIAGNOSIS — M546 Pain in thoracic spine: Secondary | ICD-10-CM | POA: Diagnosis not present

## 2019-08-06 DIAGNOSIS — R293 Abnormal posture: Secondary | ICD-10-CM | POA: Diagnosis not present

## 2019-08-07 DIAGNOSIS — F4311 Post-traumatic stress disorder, acute: Secondary | ICD-10-CM | POA: Diagnosis not present

## 2019-08-10 DIAGNOSIS — R293 Abnormal posture: Secondary | ICD-10-CM | POA: Diagnosis not present

## 2019-08-10 DIAGNOSIS — M2560 Stiffness of unspecified joint, not elsewhere classified: Secondary | ICD-10-CM | POA: Diagnosis not present

## 2019-08-10 DIAGNOSIS — M546 Pain in thoracic spine: Secondary | ICD-10-CM | POA: Diagnosis not present

## 2019-08-13 DIAGNOSIS — F4311 Post-traumatic stress disorder, acute: Secondary | ICD-10-CM | POA: Diagnosis not present

## 2019-08-15 DIAGNOSIS — M2560 Stiffness of unspecified joint, not elsewhere classified: Secondary | ICD-10-CM | POA: Diagnosis not present

## 2019-08-15 DIAGNOSIS — M546 Pain in thoracic spine: Secondary | ICD-10-CM | POA: Diagnosis not present

## 2019-08-15 DIAGNOSIS — R293 Abnormal posture: Secondary | ICD-10-CM | POA: Diagnosis not present

## 2019-08-16 ENCOUNTER — Encounter: Payer: Self-pay | Admitting: Family Medicine

## 2019-08-16 ENCOUNTER — Ambulatory Visit (INDEPENDENT_AMBULATORY_CARE_PROVIDER_SITE_OTHER): Payer: BC Managed Care – PPO | Admitting: Family Medicine

## 2019-08-16 ENCOUNTER — Other Ambulatory Visit: Payer: Self-pay

## 2019-08-16 VITALS — BP 118/70 | HR 82 | Temp 99.2°F | Wt 167.6 lb

## 2019-08-16 DIAGNOSIS — S61412S Laceration without foreign body of left hand, sequela: Secondary | ICD-10-CM | POA: Diagnosis not present

## 2019-08-16 DIAGNOSIS — F419 Anxiety disorder, unspecified: Secondary | ICD-10-CM

## 2019-08-16 NOTE — Progress Notes (Signed)
Subjective:    Patient ID: Joyce Cortez, female    DOB: May 14, 1980, 39 y.o.   MRN: [id number]  No chief complaint on file.   HPI Pt is a 39 yo female with pmg sig for anxiety, GERD, asthma, ADHD, fibromyalgia, depression, seasonal allergies who is followed by Dr. Clent Ridges, seen today for follow-up.  Pt endorses pain in her left hand on a broken martini glass July 4.  Pt was seen at urgent care the next day.  Steri-Strips placed on wound and patient given antibiotic ointment on request.  Pt seen by PCP on 7/14 for follow-up.  Advised hand healing.  Pt returns this visit as she was under the impression hand would be completely healed and she would be able to do push-ups.  Steri-Strips have yet to fall off and pt endorses skin flap.  Able to make a fist, but endorses pulling sensation with movement.  Pt denies pain, purulent drainage, erythema, decreased range of motion in hand.  Past Medical History:  Diagnosis Date  . ADHD (attention deficit hyperactivity disorder)   . Allergy   . Asthma   . Fibromyalgia     Allergies  Allergen Reactions  . Sulfa Antibiotics     RASH, FEVER AND HEADACHE   . Codeine Nausea Only  . Doxycycline Nausea And Vomiting  . Pregabalin Other (See Comments)    ROS General: Denies fever, chills, night sweats, changes in weight, changes in appetite HEENT: Denies headaches, ear pain, changes in vision, rhinorrhea, sore throat CV: Denies CP, palpitations, SOB, orthopnea Pulm: Denies SOB, cough, wheezing GI: Denies abdominal pain, nausea, vomiting, diarrhea, constipation GU: Denies dysuria, hematuria, frequency, vaginal discharge Msk: Denies muscle cramps, joint pains Neuro: Denies weakness, numbness, tingling Skin: Denies rashes, bruising  + laceration left palm Psych: Denies depression, anxiety, hallucinations     Objective:    Blood pressure 118/70, pulse 82, temperature 99.2 F (37.3 C), temperature source Oral, weight 167 lb 9.6 oz (76 kg), SpO2 98  %.  Gen. Pleasant, well-nourished, in no distress, normal affect   HEENT: [place]/AT, face symmetric, no scleral icterus, PERRLA, EOMI, nares patent without drainage Lungs: no accessory muscle use Cardiovascular: RRR, no peripheral edema Musculoskeletal: No deformities, no cyanosis or clubbing, normal tone Neuro:  A&Ox3, CN II-XII intact, normal gait Skin:  Warm, no rash.  Left palmar surface proximal to the MCP joint with healing saturated Steri-Strips almost falling off.a large amount of dried blood present on sides of laceration.  Steri-Strips carefully removed.  Area cleaned with sterile saline to remove dried blood.  Skin flap present and elevated, tissue pink in color with dried skin at proximal end of skin flap.            Wt Readings from Last 3 Encounters:  08/16/19 167 lb 9.6 oz (76 kg)  08/01/19 171 lb 9.6 oz (77.8 kg)  11/16/17 141 lb (64 kg)    Lab Results  Component Value Date   WBC 6.1 11/16/2017   HGB 13.7 11/16/2017   HCT 40.8 11/16/2017   PLT 193.0 11/16/2017   GLUCOSE 98 08/22/2017   CHOL 187 08/22/2017   TRIG 53.0 08/22/2017   HDL 59.60 08/22/2017   LDLCALC 117 (H) 08/22/2017   ALT 7 08/22/2017   AST 11 08/22/2017   NA 140 08/22/2017   K 3.8 08/22/2017   CL 103 08/22/2017   CREATININE 0.75 08/22/2017   BUN 10 08/22/2017   CO2 30 08/22/2017   TSH 1.35 11/16/2017   HGBA1C 5.4  08/22/2017    Assessment/Plan:  Laceration of left hand without foreign body, sequela  -Consent obtained.  Laceration cleaned with sterile saline.  Patient tolerated procedure well. -Area healing.  Advised to avoid submerging hand in water or picking at area -Discussed wound care -Given handouts -Though wound healing discussed referral to hand surgery. - Plan: Ambulatory referral to Hand Surgery  Anxiety -Continue current medications -Discussed ways to relieve/reduce stress -Given handout  F/u prn  Abbe Amsterdam, MD

## 2019-08-16 NOTE — Patient Instructions (Signed)
Nonsutured Laceration Care °A laceration is a cut that may go through all layers of the skin and extend into the tissue that is right under the skin. This type of cut is usually stitched up (sutured) or closed with tape (adhesive strips) or skin glue shortly after the injury happens. °However, if the wound is dirty or if several hours pass before medical treatment is provided, it is likely that germs (bacteria) will enter the wound. Closing a laceration after bacteria have entered it increases the risk of infection. In these cases, your health care provider may leave the laceration open (nonsutured) and cover it with a bandage. This type of treatment helps prevent infection and allows the wound to heal from the deepest layer of tissue damage up to the surface. °An open fracture is a type of injury that may involve nonsutured lacerations. An open fracture is a break in a bone that happens along with lacerations through the skin at the fracture site. °What are the risks? °Caring for a nonsutured laceration is safe. However, problems may occur, including a higher risk for: °· Scarring. °· Infection. °· Slow healing. °Supplies needed: °· Soap. °· Hand sanitizer. °· Sterile water or irrigation solution. °· Bandages (dressings). °· Clean towel. °· Antibiotic ointment. °How to care for your nonsutured laceration °Follow instructions from your health care provider about how to take care of your wound. °· Keep the wound clean and dry. °· Change any dressings as told by your health care provider. This includes changing the dressing when it starts to smell, or when it gets wet or dirty. °· Clean the wound one time each day, or as often as told by your health care provider. To clean your wound: °1. Wash your hands with soap and water. If soap and water are not available, use hand sanitizer. °2. Remove any dressing as told by your health care provider. °3. Clean the wound with sterile water or irrigation solution as told by your  health care provider. °4. Pat the wound dry with a clean towel. Do not rub the wound. °5. Apply a thin layer of antibiotic ointment to the wound as told by your health care provider. This will prevent infection and keep the dressing from sticking to the wound. °6. Apply a new dressing as told by your health care provider. °· Check your wound every day for signs of infection. Watch for: °? Redness, swelling, or pain. °? Fluid, blood, or pus. °? Bad smell on the wound or dressing. °? Warmth. °· Do not take baths, swim, or do anything that puts your wound underwater until your health care provider approves. °· Do not scratch or pick at the wound. °Follow these instructions at home: °· Take or apply over-the-counter and prescription medicines only as told by your health care provider. °· If you were prescribed an antibiotic medicine, take or apply it as told by your health care provider. Do not stop using the antibiotic even if your condition improves. °· Do not inject anything into the wound unless directed by your health care provider. °· Raise (elevate) the injured area above the level of your heart while you are sitting or lying down, if possible. °· If directed, put ice on the affected area: °? Put ice in a plastic bag. °? Place a towel between your skin and the bag. °? Leave the ice on for 20 minutes, 2-3 times a day. °· Keep all follow-up visits as told by your health care provider. This is important. °  Contact a health care provider if:  You received a tetanus shot and you have swelling, severe pain, redness, or bleeding at the injection site.  You have a fever.  Your pain is not controlled with medicine.  You have increased redness, swelling, or pain at the site of your wound.  You have fluid, blood, or pus coming from your wound.  You notice a bad smell coming from your wound or your dressing.  You notice something coming out of the wound, such as wood or glass.  You notice a change in the color  of your skin near your wound.  You develop a new rash.  You need to change the dressing frequently due to fluid, blood, or pus draining from the wound.  You develop numbness around your wound. Get help right away if:  Your pain suddenly increases and is severe.  You develop severe swelling around the wound.  The wound is on your hand or foot and you cannot properly move a finger or toe.  The wound is on your hand or foot, and you notice that your fingers or toes look pale or bluish.  You have a red streak going away from your wound. Summary  A laceration is a cut that may go through all layers of the skin and extend into the tissue that is right under the skin. It is usually closed with stitches, tape, or skin glue shortly after the injury happens.  If a wound is dirty or if several hours pass before medical treatment is provided, the laceration may be kept open (nonsutured) and covered with a bandage.  This type of treatment helps prevent infection and allows the wound to heal from the deepest layer of tissue damage up to the surface.  Follow instructions from your health care provider about how to take care of your wound. This information is not intended to replace advice given to you by your health care provider. Make sure you discuss any questions you have with your health care provider. Document Revised: 04/28/2018 Document Reviewed: 01/24/2017 Elsevier Patient Education  2020 [employer]  Managing Anxiety, Adult After being diagnosed with an anxiety disorder, you may be relieved to know why you have felt or behaved a certain way. You may also feel overwhelmed about the treatment ahead and what it will mean for your life. With care and support, you can manage this condition and recover from it. How to manage lifestyle changes Managing stress and anxiety  Stress is your body's reaction to life changes and events, both good and bad. Most stress will last just a few hours,  but stress can be ongoing and can lead to more than just stress. Although stress can play a major role in anxiety, it is not the same as anxiety. Stress is usually caused by something external, such as a deadline, test, or competition. Stress normally passes after the triggering event has ended.  Anxiety is caused by something internal, such as imagining a terrible outcome or worrying that something will go wrong that will devastate you. Anxiety often does not go away even after the triggering event is over, and it can become long-term (chronic) worry. It is important to understand the differences between stress and anxiety and to manage your stress effectively so that it does not lead to an anxious response. Talk with your health care provider or a counselor to learn more about reducing anxiety and stress. He or she may suggest tension reduction techniques, such as:  Music therapy. This can include creating or listening to music that you enjoy and that inspires you.  Mindfulness-based meditation. This involves being aware of your normal breaths while not trying to control your breathing. It can be done while sitting or walking.  Centering prayer. This involves focusing on a word, phrase, or sacred image that means something to you and brings you peace.  Deep breathing. To do this, expand your stomach and inhale slowly through your nose. Hold your breath for 3-5 seconds. Then exhale slowly, letting your stomach muscles relax.  Self-talk. This involves identifying thought patterns that lead to anxiety reactions and changing those patterns.  Muscle relaxation. This involves tensing muscles and then relaxing them. Choose a tension reduction technique that suits your lifestyle and personality. These techniques take time and practice. Set aside 5-15 minutes a day to do them. Therapists can offer counseling and training in these techniques. The training to help with anxiety may be covered by some insurance  plans. Other things you can do to manage stress and anxiety include:  Keeping a stress/anxiety diary. This can help you learn what triggers your reaction and then learn ways to manage your response.  Thinking about how you react to certain situations. You may not be able to control everything, but you can control your response.  Making time for activities that help you relax and not feeling guilty about spending your time in this way.  Visual imagery and yoga can help you stay calm and relax.  Medicines Medicines can help ease symptoms. Medicines for anxiety include:  Anti-anxiety drugs.  Antidepressants. Medicines are often used as a primary treatment for anxiety disorder. Medicines will be prescribed by a health care provider. When used together, medicines, psychotherapy, and tension reduction techniques may be the most effective treatment. Relationships Relationships can play a big part in helping you recover. Try to spend more time connecting with trusted friends and family members. Consider going to couples counseling, taking family education classes, or going to family therapy. Therapy can help you and others better understand your condition. How to recognize changes in your anxiety Everyone responds differently to treatment for anxiety. Recovery from anxiety happens when symptoms decrease and stop interfering with your daily activities at home or work. This may mean that you will start to:  Have better concentration and focus. Worry will interfere less in your daily thinking.  Sleep better.  Be less irritable.  Have more energy.  Have improved memory. It is important to recognize when your condition is getting worse. Contact your health care provider if your symptoms interfere with home or work and you feel like your condition is not improving. Follow these instructions at home: Activity  Exercise. Most adults should do the following: ? Exercise for at least 150 minutes  each week. The exercise should increase your heart rate and make you sweat (moderate-intensity exercise). ? Strengthening exercises at least twice a week.  Get the right amount and quality of sleep. Most adults need 7-9 hours of sleep each night. Lifestyle   Eat a healthy diet that includes plenty of vegetables, fruits, whole grains, low-fat dairy products, and lean protein. Do not eat a lot of foods that are high in solid fats, added sugars, or salt.  Make choices that simplify your life.  Do not use any products that contain nicotine or tobacco, such as cigarettes, e-cigarettes, and chewing tobacco. If you need help quitting, ask your health care provider.  Avoid caffeine, alcohol, and certain  over-the-counter cold medicines. These may make you feel worse. Ask your pharmacist which medicines to avoid. General instructions  Take over-the-counter and prescription medicines only as told by your health care provider.  Keep all follow-up visits as told by your health care provider. This is important. Where to find support You can get help and support from these sources:  Self-help groups.  Online and [employer].  A trusted spiritual leader.  Couples counseling.  Family education classes.  Family therapy. Where to find more information You may find that joining a support group helps you deal with your anxiety. The following sources can help you locate counselors or support groups near you:  Mental Health America: www.mentalhealthamerica.net  Anxiety and Depression Association of [place] (ADAA): ProgramCam.de  [employer] Mental Illness (NAMI): www.nami.org Contact a health care provider if you:  Have a hard time staying focused or finishing daily tasks.  Spend many hours a day feeling worried about everyday life.  Become exhausted by worry.  Start to have headaches, feel tense, or have nausea.  Urinate more than normal.  Have diarrhea. Get help  right away if you have:  A racing heart and shortness of breath.  Thoughts of hurting yourself or others. If you ever feel like you may hurt yourself or others, or have thoughts about taking your own life, get help right away. You can go to your nearest emergency department or call:  Your local emergency services (911 in the [place]  A suicide crisis helpline, such as the National Suicide Prevention Lifeline at 908-098-9339. This is open 24 hours a day. Summary  Taking steps to learn and use tension reduction techniques can help calm you and help prevent triggering an anxiety reaction.  When used together, medicines, psychotherapy, and tension reduction techniques may be the most effective treatment.  Family, friends, and partners can play a big part in helping you recover from an anxiety disorder. This information is not intended to replace advice given to you by your health care provider. Make sure you discuss any questions you have with your health care provider. Document Revised: 06/06/2018 Document Reviewed: 06/06/2018 Elsevier Patient Education  2020 [employer].

## 2019-08-17 DIAGNOSIS — Z79899 Other long term (current) drug therapy: Secondary | ICD-10-CM | POA: Diagnosis not present

## 2019-08-17 DIAGNOSIS — J3089 Other allergic rhinitis: Secondary | ICD-10-CM | POA: Diagnosis not present

## 2019-08-17 DIAGNOSIS — Z516 Encounter for desensitization to allergens: Secondary | ICD-10-CM | POA: Diagnosis not present

## 2019-08-28 DIAGNOSIS — Z516 Encounter for desensitization to allergens: Secondary | ICD-10-CM | POA: Diagnosis not present

## 2019-08-28 DIAGNOSIS — F4311 Post-traumatic stress disorder, acute: Secondary | ICD-10-CM | POA: Diagnosis not present

## 2019-08-29 DIAGNOSIS — S61412A Laceration without foreign body of left hand, initial encounter: Secondary | ICD-10-CM | POA: Diagnosis not present

## 2019-09-04 DIAGNOSIS — S61412D Laceration without foreign body of left hand, subsequent encounter: Secondary | ICD-10-CM | POA: Diagnosis not present

## 2019-09-04 DIAGNOSIS — X58XXXD Exposure to other specified factors, subsequent encounter: Secondary | ICD-10-CM | POA: Diagnosis not present

## 2019-09-04 DIAGNOSIS — F4311 Post-traumatic stress disorder, acute: Secondary | ICD-10-CM | POA: Diagnosis not present

## 2019-09-10 DIAGNOSIS — F4311 Post-traumatic stress disorder, acute: Secondary | ICD-10-CM | POA: Diagnosis not present

## 2019-09-10 DIAGNOSIS — M2560 Stiffness of unspecified joint, not elsewhere classified: Secondary | ICD-10-CM | POA: Diagnosis not present

## 2019-09-10 DIAGNOSIS — M546 Pain in thoracic spine: Secondary | ICD-10-CM | POA: Diagnosis not present

## 2019-09-10 DIAGNOSIS — R293 Abnormal posture: Secondary | ICD-10-CM | POA: Diagnosis not present

## 2019-09-11 DIAGNOSIS — S61412D Laceration without foreign body of left hand, subsequent encounter: Secondary | ICD-10-CM | POA: Diagnosis not present

## 2019-09-11 DIAGNOSIS — X58XXXD Exposure to other specified factors, subsequent encounter: Secondary | ICD-10-CM | POA: Diagnosis not present

## 2019-09-12 DIAGNOSIS — S61412D Laceration without foreign body of left hand, subsequent encounter: Secondary | ICD-10-CM | POA: Diagnosis not present

## 2019-09-12 DIAGNOSIS — X58XXXD Exposure to other specified factors, subsequent encounter: Secondary | ICD-10-CM | POA: Diagnosis not present

## 2019-09-18 DIAGNOSIS — F4311 Post-traumatic stress disorder, acute: Secondary | ICD-10-CM | POA: Diagnosis not present

## 2019-09-18 DIAGNOSIS — J3089 Other allergic rhinitis: Secondary | ICD-10-CM | POA: Diagnosis not present

## 2019-09-18 DIAGNOSIS — X58XXXD Exposure to other specified factors, subsequent encounter: Secondary | ICD-10-CM | POA: Diagnosis not present

## 2019-09-18 DIAGNOSIS — S61412D Laceration without foreign body of left hand, subsequent encounter: Secondary | ICD-10-CM | POA: Diagnosis not present

## 2019-09-25 DIAGNOSIS — F4311 Post-traumatic stress disorder, acute: Secondary | ICD-10-CM | POA: Diagnosis not present

## 2019-09-25 DIAGNOSIS — X58XXXD Exposure to other specified factors, subsequent encounter: Secondary | ICD-10-CM | POA: Diagnosis not present

## 2019-09-25 DIAGNOSIS — S61412D Laceration without foreign body of left hand, subsequent encounter: Secondary | ICD-10-CM | POA: Diagnosis not present

## 2019-10-01 DIAGNOSIS — Z79899 Other long term (current) drug therapy: Secondary | ICD-10-CM | POA: Diagnosis not present

## 2019-10-01 DIAGNOSIS — Z516 Encounter for desensitization to allergens: Secondary | ICD-10-CM | POA: Diagnosis not present

## 2019-10-01 DIAGNOSIS — J3089 Other allergic rhinitis: Secondary | ICD-10-CM | POA: Diagnosis not present

## 2019-10-02 DIAGNOSIS — F4311 Post-traumatic stress disorder, acute: Secondary | ICD-10-CM | POA: Diagnosis not present

## 2019-10-10 DIAGNOSIS — F4311 Post-traumatic stress disorder, acute: Secondary | ICD-10-CM | POA: Diagnosis not present

## 2019-10-15 DIAGNOSIS — X58XXXD Exposure to other specified factors, subsequent encounter: Secondary | ICD-10-CM | POA: Diagnosis not present

## 2019-10-15 DIAGNOSIS — S61412D Laceration without foreign body of left hand, subsequent encounter: Secondary | ICD-10-CM | POA: Diagnosis not present

## 2019-10-16 DIAGNOSIS — F4311 Post-traumatic stress disorder, acute: Secondary | ICD-10-CM | POA: Diagnosis not present

## 2019-10-17 DIAGNOSIS — Z113 Encounter for screening for infections with a predominantly sexual mode of transmission: Secondary | ICD-10-CM | POA: Diagnosis not present

## 2019-10-17 DIAGNOSIS — B373 Candidiasis of vulva and vagina: Secondary | ICD-10-CM | POA: Diagnosis not present

## 2019-10-17 DIAGNOSIS — Z1159 Encounter for screening for other viral diseases: Secondary | ICD-10-CM | POA: Diagnosis not present

## 2019-10-17 DIAGNOSIS — N898 Other specified noninflammatory disorders of vagina: Secondary | ICD-10-CM | POA: Diagnosis not present

## 2019-10-17 DIAGNOSIS — Z114 Encounter for screening for human immunodeficiency virus [HIV]: Secondary | ICD-10-CM | POA: Diagnosis not present

## 2019-10-17 DIAGNOSIS — N941 Unspecified dyspareunia: Secondary | ICD-10-CM | POA: Diagnosis not present

## 2019-10-17 DIAGNOSIS — N6459 Other signs and symptoms in breast: Secondary | ICD-10-CM | POA: Diagnosis not present

## 2019-10-17 DIAGNOSIS — R300 Dysuria: Secondary | ICD-10-CM | POA: Diagnosis not present

## 2019-10-22 DIAGNOSIS — J3089 Other allergic rhinitis: Secondary | ICD-10-CM | POA: Diagnosis not present

## 2019-10-22 DIAGNOSIS — Z516 Encounter for desensitization to allergens: Secondary | ICD-10-CM | POA: Diagnosis not present

## 2019-10-23 DIAGNOSIS — F4311 Post-traumatic stress disorder, acute: Secondary | ICD-10-CM | POA: Diagnosis not present

## 2019-10-30 DIAGNOSIS — F4311 Post-traumatic stress disorder, acute: Secondary | ICD-10-CM | POA: Diagnosis not present

## 2019-11-06 DIAGNOSIS — F4311 Post-traumatic stress disorder, acute: Secondary | ICD-10-CM | POA: Diagnosis not present

## 2019-11-12 DIAGNOSIS — J3089 Other allergic rhinitis: Secondary | ICD-10-CM | POA: Diagnosis not present

## 2019-11-13 DIAGNOSIS — F4311 Post-traumatic stress disorder, acute: Secondary | ICD-10-CM | POA: Diagnosis not present

## 2019-11-14 ENCOUNTER — Ambulatory Visit: Payer: BC Managed Care – PPO | Admitting: Family Medicine

## 2019-11-14 ENCOUNTER — Telehealth: Payer: Self-pay

## 2019-11-14 NOTE — Telephone Encounter (Signed)
Patient called and she says she received the COVID booster on 11/05/19 and for 2 days after she was extremely fatigued, fever, in bed and only out of bed to go to the bathroom, dehydrated due to not drinking enough. She says on Thursday she went to work and pushed through, wheezing until Saturday. She says she started feeling better on Sunday and realized she was conversing with people via text while she was not feeling well and had no memory of it. She wants to know if Dr. Clent Cortez recommends not to receive any more COVID boosters, if they become available. I advised I will send this to Dr. Clent Cortez and call back with his recommendation.

## 2019-11-14 NOTE — Telephone Encounter (Signed)
New message    The patient is asking for a call back regarding reaction to covid vaccine on 10.18.21.   Late arrival for appt today @ 3:15pm

## 2019-11-16 NOTE — Telephone Encounter (Signed)
It think all of [place] will adjust to Covid vaccines over time as we get exposed to them more and more. I would advise her to get a booster next year(if this is recommended)

## 2019-11-20 DIAGNOSIS — F4311 Post-traumatic stress disorder, acute: Secondary | ICD-10-CM | POA: Diagnosis not present

## 2019-11-27 DIAGNOSIS — F4311 Post-traumatic stress disorder, acute: Secondary | ICD-10-CM | POA: Diagnosis not present

## 2019-12-03 DIAGNOSIS — Z516 Encounter for desensitization to allergens: Secondary | ICD-10-CM | POA: Diagnosis not present

## 2019-12-03 DIAGNOSIS — J3089 Other allergic rhinitis: Secondary | ICD-10-CM | POA: Diagnosis not present

## 2019-12-04 DIAGNOSIS — F4311 Post-traumatic stress disorder, acute: Secondary | ICD-10-CM | POA: Diagnosis not present

## 2019-12-11 DIAGNOSIS — F4311 Post-traumatic stress disorder, acute: Secondary | ICD-10-CM | POA: Diagnosis not present

## 2019-12-18 DIAGNOSIS — F4311 Post-traumatic stress disorder, acute: Secondary | ICD-10-CM | POA: Diagnosis not present

## 2019-12-24 DIAGNOSIS — N760 Acute vaginitis: Secondary | ICD-10-CM | POA: Diagnosis not present

## 2019-12-24 DIAGNOSIS — R109 Unspecified abdominal pain: Secondary | ICD-10-CM | POA: Diagnosis not present

## 2019-12-24 DIAGNOSIS — N898 Other specified noninflammatory disorders of vagina: Secondary | ICD-10-CM | POA: Diagnosis not present

## 2019-12-24 DIAGNOSIS — Z30431 Encounter for routine checking of intrauterine contraceptive device: Secondary | ICD-10-CM | POA: Diagnosis not present

## 2019-12-24 DIAGNOSIS — J3089 Other allergic rhinitis: Secondary | ICD-10-CM | POA: Diagnosis not present

## 2019-12-24 DIAGNOSIS — Z516 Encounter for desensitization to allergens: Secondary | ICD-10-CM | POA: Diagnosis not present

## 2019-12-25 DIAGNOSIS — F4311 Post-traumatic stress disorder, acute: Secondary | ICD-10-CM | POA: Diagnosis not present

## 2020-01-01 DIAGNOSIS — F4311 Post-traumatic stress disorder, acute: Secondary | ICD-10-CM | POA: Diagnosis not present

## 2020-01-01 DIAGNOSIS — R1084 Generalized abdominal pain: Secondary | ICD-10-CM | POA: Diagnosis not present

## 2020-01-01 DIAGNOSIS — N760 Acute vaginitis: Secondary | ICD-10-CM | POA: Diagnosis not present

## 2020-01-14 DIAGNOSIS — Z30432 Encounter for removal of intrauterine contraceptive device: Secondary | ICD-10-CM | POA: Diagnosis not present

## 2020-01-14 DIAGNOSIS — B373 Candidiasis of vulva and vagina: Secondary | ICD-10-CM | POA: Diagnosis not present

## 2020-01-30 DIAGNOSIS — J3089 Other allergic rhinitis: Secondary | ICD-10-CM | POA: Diagnosis not present

## 2020-02-19 DIAGNOSIS — F4311 Post-traumatic stress disorder, acute: Secondary | ICD-10-CM | POA: Diagnosis not present

## 2020-02-22 DIAGNOSIS — Z03818 Encounter for observation for suspected exposure to other biological agents ruled out: Secondary | ICD-10-CM | POA: Diagnosis not present

## 2020-02-26 DIAGNOSIS — F4311 Post-traumatic stress disorder, acute: Secondary | ICD-10-CM | POA: Diagnosis not present

## 2020-03-04 DIAGNOSIS — F4311 Post-traumatic stress disorder, acute: Secondary | ICD-10-CM | POA: Diagnosis not present

## 2020-03-05 DIAGNOSIS — J3089 Other allergic rhinitis: Secondary | ICD-10-CM | POA: Diagnosis not present

## 2020-03-06 DIAGNOSIS — N898 Other specified noninflammatory disorders of vagina: Secondary | ICD-10-CM | POA: Diagnosis not present

## 2020-03-06 DIAGNOSIS — Z3009 Encounter for other general counseling and advice on contraception: Secondary | ICD-10-CM | POA: Diagnosis not present

## 2020-03-06 DIAGNOSIS — N760 Acute vaginitis: Secondary | ICD-10-CM | POA: Diagnosis not present

## 2020-03-11 DIAGNOSIS — F4311 Post-traumatic stress disorder, acute: Secondary | ICD-10-CM | POA: Diagnosis not present

## 2020-03-17 DIAGNOSIS — F4311 Post-traumatic stress disorder, acute: Secondary | ICD-10-CM | POA: Diagnosis not present

## 2020-03-25 DIAGNOSIS — F4311 Post-traumatic stress disorder, acute: Secondary | ICD-10-CM | POA: Diagnosis not present

## 2020-03-27 ENCOUNTER — Other Ambulatory Visit: Payer: Self-pay | Admitting: Family Medicine

## 2020-03-27 DIAGNOSIS — N898 Other specified noninflammatory disorders of vagina: Secondary | ICD-10-CM | POA: Diagnosis not present

## 2020-03-27 DIAGNOSIS — R350 Frequency of micturition: Secondary | ICD-10-CM | POA: Diagnosis not present

## 2020-03-27 DIAGNOSIS — N390 Urinary tract infection, site not specified: Secondary | ICD-10-CM | POA: Diagnosis not present

## 2020-03-31 ENCOUNTER — Other Ambulatory Visit: Payer: Self-pay

## 2020-03-31 MED ORDER — AZELASTINE HCL 0.1 % NA SOLN: NASAL | 1 refills | Status: DC

## 2020-04-01 DIAGNOSIS — F4311 Post-traumatic stress disorder, acute: Secondary | ICD-10-CM | POA: Diagnosis not present

## 2020-04-03 DIAGNOSIS — N760 Acute vaginitis: Secondary | ICD-10-CM | POA: Diagnosis not present

## 2020-04-03 DIAGNOSIS — Z118 Encounter for screening for other infectious and parasitic diseases: Secondary | ICD-10-CM | POA: Diagnosis not present

## 2020-04-03 DIAGNOSIS — Z114 Encounter for screening for human immunodeficiency virus [HIV]: Secondary | ICD-10-CM | POA: Diagnosis not present

## 2020-04-03 DIAGNOSIS — R350 Frequency of micturition: Secondary | ICD-10-CM | POA: Diagnosis not present

## 2020-04-03 DIAGNOSIS — Z113 Encounter for screening for infections with a predominantly sexual mode of transmission: Secondary | ICD-10-CM | POA: Diagnosis not present

## 2020-04-03 DIAGNOSIS — Z1159 Encounter for screening for other viral diseases: Secondary | ICD-10-CM | POA: Diagnosis not present

## 2020-04-04 DIAGNOSIS — F4311 Post-traumatic stress disorder, acute: Secondary | ICD-10-CM | POA: Diagnosis not present

## 2020-04-08 DIAGNOSIS — Z516 Encounter for desensitization to allergens: Secondary | ICD-10-CM | POA: Diagnosis not present

## 2020-04-08 DIAGNOSIS — F4311 Post-traumatic stress disorder, acute: Secondary | ICD-10-CM | POA: Diagnosis not present

## 2020-04-08 DIAGNOSIS — J3089 Other allergic rhinitis: Secondary | ICD-10-CM | POA: Diagnosis not present

## 2020-04-15 DIAGNOSIS — R293 Abnormal posture: Secondary | ICD-10-CM | POA: Diagnosis not present

## 2020-04-15 DIAGNOSIS — M545 Low back pain, unspecified: Secondary | ICD-10-CM | POA: Diagnosis not present

## 2020-04-15 DIAGNOSIS — F4311 Post-traumatic stress disorder, acute: Secondary | ICD-10-CM | POA: Diagnosis not present

## 2020-04-15 DIAGNOSIS — M546 Pain in thoracic spine: Secondary | ICD-10-CM | POA: Diagnosis not present

## 2020-04-15 DIAGNOSIS — M542 Cervicalgia: Secondary | ICD-10-CM | POA: Diagnosis not present

## 2020-04-16 DIAGNOSIS — M542 Cervicalgia: Secondary | ICD-10-CM | POA: Diagnosis not present

## 2020-04-16 DIAGNOSIS — J3081 Allergic rhinitis due to animal (cat) (dog) hair and dander: Secondary | ICD-10-CM | POA: Diagnosis not present

## 2020-04-16 DIAGNOSIS — J309 Allergic rhinitis, unspecified: Secondary | ICD-10-CM | POA: Diagnosis not present

## 2020-04-16 DIAGNOSIS — R293 Abnormal posture: Secondary | ICD-10-CM | POA: Diagnosis not present

## 2020-04-16 DIAGNOSIS — J301 Allergic rhinitis due to pollen: Secondary | ICD-10-CM | POA: Diagnosis not present

## 2020-04-16 DIAGNOSIS — M545 Low back pain, unspecified: Secondary | ICD-10-CM | POA: Diagnosis not present

## 2020-04-16 DIAGNOSIS — M546 Pain in thoracic spine: Secondary | ICD-10-CM | POA: Diagnosis not present

## 2020-04-16 DIAGNOSIS — J3089 Other allergic rhinitis: Secondary | ICD-10-CM | POA: Diagnosis not present

## 2020-04-22 DIAGNOSIS — F4311 Post-traumatic stress disorder, acute: Secondary | ICD-10-CM | POA: Diagnosis not present

## 2020-04-23 DIAGNOSIS — R350 Frequency of micturition: Secondary | ICD-10-CM | POA: Diagnosis not present

## 2020-04-23 DIAGNOSIS — N898 Other specified noninflammatory disorders of vagina: Secondary | ICD-10-CM | POA: Diagnosis not present

## 2020-04-23 DIAGNOSIS — Z114 Encounter for screening for human immunodeficiency virus [HIV]: Secondary | ICD-10-CM | POA: Diagnosis not present

## 2020-04-23 DIAGNOSIS — N760 Acute vaginitis: Secondary | ICD-10-CM | POA: Diagnosis not present

## 2020-04-23 DIAGNOSIS — Z1159 Encounter for screening for other viral diseases: Secondary | ICD-10-CM | POA: Diagnosis not present

## 2020-04-23 DIAGNOSIS — Z113 Encounter for screening for infections with a predominantly sexual mode of transmission: Secondary | ICD-10-CM | POA: Diagnosis not present

## 2020-04-29 DIAGNOSIS — F4311 Post-traumatic stress disorder, acute: Secondary | ICD-10-CM | POA: Diagnosis not present

## 2020-05-07 DIAGNOSIS — M542 Cervicalgia: Secondary | ICD-10-CM | POA: Diagnosis not present

## 2020-05-07 DIAGNOSIS — M545 Low back pain, unspecified: Secondary | ICD-10-CM | POA: Diagnosis not present

## 2020-05-07 DIAGNOSIS — M546 Pain in thoracic spine: Secondary | ICD-10-CM | POA: Diagnosis not present

## 2020-05-07 DIAGNOSIS — R293 Abnormal posture: Secondary | ICD-10-CM | POA: Diagnosis not present

## 2020-05-20 DIAGNOSIS — F4311 Post-traumatic stress disorder, acute: Secondary | ICD-10-CM | POA: Diagnosis not present

## 2020-05-27 DIAGNOSIS — F4311 Post-traumatic stress disorder, acute: Secondary | ICD-10-CM | POA: Diagnosis not present

## 2020-06-02 DIAGNOSIS — J3089 Other allergic rhinitis: Secondary | ICD-10-CM | POA: Diagnosis not present

## 2020-06-03 DIAGNOSIS — F4311 Post-traumatic stress disorder, acute: Secondary | ICD-10-CM | POA: Diagnosis not present

## 2020-06-10 DIAGNOSIS — F4311 Post-traumatic stress disorder, acute: Secondary | ICD-10-CM | POA: Diagnosis not present

## 2020-06-17 DIAGNOSIS — J3089 Other allergic rhinitis: Secondary | ICD-10-CM | POA: Diagnosis not present

## 2020-06-17 DIAGNOSIS — F4311 Post-traumatic stress disorder, acute: Secondary | ICD-10-CM | POA: Diagnosis not present

## 2020-06-24 DIAGNOSIS — F4311 Post-traumatic stress disorder, acute: Secondary | ICD-10-CM | POA: Diagnosis not present

## 2020-06-30 DIAGNOSIS — Z3162 Encounter for fertility preservation counseling: Secondary | ICD-10-CM | POA: Diagnosis not present

## 2020-06-30 DIAGNOSIS — Z3169 Encounter for other general counseling and advice on procreation: Secondary | ICD-10-CM | POA: Diagnosis not present

## 2020-07-01 DIAGNOSIS — F4311 Post-traumatic stress disorder, acute: Secondary | ICD-10-CM | POA: Diagnosis not present

## 2020-07-02 ENCOUNTER — Telehealth: Payer: Self-pay | Admitting: Family Medicine

## 2020-07-02 NOTE — Telephone Encounter (Signed)
Patient needs to be seen for pink eye--triage nurse stated she needed to be seen within 4 hours, however she refuses to go to urgent care or ED.  She wants a call back.

## 2020-07-02 NOTE — Telephone Encounter (Signed)
Patient's last office visit- 08/01/19

## 2020-07-03 ENCOUNTER — Telehealth: Payer: BC Managed Care – PPO | Admitting: Family Medicine

## 2020-07-03 NOTE — Telephone Encounter (Signed)
I agree, she needs an OV. Unfortunately I am full today

## 2020-07-03 NOTE — Telephone Encounter (Signed)
Left a detailed message for pt to cal the office and schedule an OV with Dr Clent Ridges

## 2020-07-04 NOTE — Telephone Encounter (Signed)
Pt is scheduled for a video visit with Dr Selena Batten on 07/08/2020

## 2020-07-08 ENCOUNTER — Telehealth (INDEPENDENT_AMBULATORY_CARE_PROVIDER_SITE_OTHER): Payer: BC Managed Care – PPO | Admitting: Family Medicine

## 2020-07-08 DIAGNOSIS — H5789 Other specified disorders of eye and adnexa: Secondary | ICD-10-CM | POA: Diagnosis not present

## 2020-07-08 DIAGNOSIS — F4311 Post-traumatic stress disorder, acute: Secondary | ICD-10-CM | POA: Diagnosis not present

## 2020-07-08 MED ORDER — ERYTHROMYCIN 5 MG/GM OP OINT
1.0000 | TOPICAL_OINTMENT | Freq: Every day | OPHTHALMIC | 0 refills | Status: DC
Start: 2020-07-08 — End: 2020-07-08

## 2020-07-08 MED ORDER — ERYTHROMYCIN 5 MG/GM OP OINT: 1.0000 "application " | TOPICAL_OINTMENT | Freq: Every day | OPHTHALMIC | 0 refills | Status: DC

## 2020-07-08 NOTE — Progress Notes (Signed)
Virtual Visit via Video Note  I connected with Joyce Cortez  on 07/08/20 at  1:00 PM EDT by a video enabled telemedicine application and verified that I am speaking with the correct person using two identifiers.  Location patient: home, [place] Location provider:work or home office Persons participating in the virtual visit: patient, provider  I discussed the limitations of evaluation and management by telemedicine and the availability of in person appointments. The patient expressed understanding and agreed to proceed.   HPI:  Acute telemedicine visit for "conjunctivitis": -Onset:reports some issues chronically related to her allergies - hx of intermittent runny and itchy eyes, sometimes has some swelling of the lids that resolves with benadryl. Sees allergist for her allergies. -for the last week or so has seen some discolor discharge from the R eye and some crusting of the eyelids in the mornings with a little redness of the eye and some irritated sensation -Denies:fevers, HA, vision loss, vomiting, known trauma  ROS: See pertinent positives and negatives per HPI.  Past Medical History:  Diagnosis Date   ADHD (attention deficit hyperactivity disorder)    Allergy    Asthma    Fibromyalgia     Past Surgical History:  Procedure Laterality Date   WISDOM TOOTH EXTRACTION       Current Outpatient Medications:    albuterol (PROVENTIL HFA;VENTOLIN HFA) 108 (90 Base) MCG/ACT inhaler, Inhale 2 puffs into the lungs every 4 (four) hours as needed for wheezing or shortness of breath., Disp: 1 Inhaler, Rfl: 2   ALPRAZolam (XANAX XR) 0.5 MG 24 hr tablet, Take 1 tablet (0.5 mg total) by mouth 2 (two) times daily as needed for anxiety., Disp: 60 tablet, Rfl: 2   amphetamine-dextroamphetamine (ADDERALL) 20 MG tablet, Take 1 tablet (20 mg total) by mouth 2 (two) times daily., Disp: 60 tablet, Rfl: 0   azelastine (ASTELIN) 0.1 % nasal spray, Use in each nostril as directed, Disp: 30 mL, Rfl: 1    cephALEXin (KEFLEX) 500 MG capsule, Take 1 capsule (500 mg total) by mouth 3 (three) times daily., Disp: 30 capsule, Rfl: 0   COPPER PO, by Intrauterine route., Disp: , Rfl:    erythromycin ophthalmic ointment, Place 1 application into the right eye at bedtime., Disp: 3.5 g, Rfl: 0   fexofenadine (ALLEGRA) 180 MG tablet, Take 180 mg by mouth daily as needed. , Disp: , Rfl:    fluticasone (FLONASE) 50 MCG/ACT nasal spray, USE 1 SPRAY INTO EACH NOSTRIL TWICE A DAY, Disp: 16 g, Rfl: 4   ibuprofen (ADVIL,MOTRIN) 200 MG tablet, Take 200 mg by mouth every 6 (six) hours as needed., Disp: , Rfl:    ketoconazole (NIZORAL) 2 % shampoo, APPLY AS DIRECTED 3 TIMES A WEEK, Disp: , Rfl: 3   levocetirizine (XYZAL) 5 MG tablet, TAKE 1 TABLET BY MOUTH EVERY DAY IN THE EVENING, Disp: 30 tablet, Rfl: 11   methylPREDNISolone (MEDROL DOSEPAK) 4 MG TBPK tablet, As directed, Disp: 21 tablet, Rfl: 0   montelukast (SINGULAIR) 10 MG tablet, Take 1 tablet (10 mg total) by mouth at bedtime., Disp: 90 tablet, Rfl: 3   olopatadine (PATANOL) 0.1 % ophthalmic solution, Place 1 drop into both eyes 2 (two) times daily., Disp: 5 mL, Rfl: 11   Turmeric (RA TURMERIC EXTRA STRENGTH) 1053 MG TABS, Take by mouth., Disp: , Rfl:   EXAM:  VITALS per patient if applicable:  GENERAL: alert, oriented, appears well and in no acute distress  HEENT: on limited video visit exam, head is atraumatic, conjunttiva  mildly erythematous, no obvious abnormalities on inspection of external nose and ears, no appreciable periocular edema or discoloration, EOMI, PER  NECK: normal movements of the head and neck  LUNGS: on inspection no signs of respiratory distress, breathing rate appears normal, no obvious gross SOB, gasping or wheezing  CV: no obvious cyanosis  MS: moves all visible extremities without noticeable abnormality  PSYCH/NEURO: pleasant and cooperative, no obvious depression or anxiety, speech and thought processing grossly  intact  ASSESSMENT AND PLAN:  Discussed the following assessment and plan:  Eye irritation  -we discussed possible serious and likely etiologies, options for evaluation and workup, limitations of telemedicine visit vs in person visit, treatment, treatment risks and precautions. Pt prefers to treat via telemedicine empirically rather than in person at this moment. She feels this is conjunctivitis and opted to try erythro ointment. Advised low threshold to seek prompt in person care if worsening, new symptoms arise, or if is not improving with treatment. Discussed options for inperson care if PCP office not available. Did let this patient know that I only do telemedicine on Tuesdays and Thursdays for [place]. Advised to schedule follow up visit with PCP or [facility] if any further questions or concerns to avoid delays in care.   I discussed the assessment and treatment plan with the patient. The patient was provided an opportunity to ask questions and all were answered. The patient agreed with the plan and demonstrated an understanding of the instructions.     Terressa Koyanagi, DO

## 2020-07-08 NOTE — Patient Instructions (Signed)
-  I sent the medication(s) we discussed to your pharmacy: Meds ordered this encounter  Medications   DISCONTD: erythromycin ophthalmic ointment    Sig: Place 1 application into the left eye at bedtime.    Dispense:  3.5 g    Refill:  0   erythromycin ophthalmic ointment    Sig: Place 1 application into the right eye at bedtime.    Dispense:  3.5 g    Refill:  0     I hope you are feeling better soon!  Seek in person care with your primary care doctor, urgent care or and eye doctor promptly if your symptoms worsen, new concerns arise or you are not improving with treatment over the next 1-2 days.  It was nice to meet you today. I help [place] out with telemedicine visits on Tuesdays and Thursdays and am available for visits on those days. If you have any concerns or questions following this visit please schedule a follow up visit with your Primary Care doctor or seek care at a local urgent care clinic to avoid delays in care.

## 2020-07-14 DIAGNOSIS — Z5112 Encounter for antineoplastic immunotherapy: Secondary | ICD-10-CM | POA: Diagnosis not present

## 2020-07-15 DIAGNOSIS — F4311 Post-traumatic stress disorder, acute: Secondary | ICD-10-CM | POA: Diagnosis not present

## 2020-07-16 ENCOUNTER — Telehealth: Payer: Self-pay | Admitting: Family Medicine

## 2020-07-16 NOTE — Telephone Encounter (Signed)
Pt is calling in wanting to know if she should stop taking the antibiotic b/c it is impairing her vision in the (R eye) due to her having blurred vision.  Pt wanted to speak with someone so she was transferred to the Triage Nurse but still would like to have the advise from Dr. Clent Ridges b/c she stated that her eye feels like it has glaucoma on it.

## 2020-07-17 NOTE — Telephone Encounter (Signed)
Tell her to stop using the eye drops and see if she gets better. If she is not better by next week, have her set up an in person OV to examine her

## 2020-07-17 NOTE — Telephone Encounter (Signed)
Please advise 

## 2020-07-17 NOTE — Telephone Encounter (Addendum)
Left detailed voicemail from message below, and sent my chart message

## 2020-07-17 NOTE — Telephone Encounter (Addendum)
Patient called on 07/16/2020 wanting a follow up appt for Pink eye with Dr. Clent Ridges last seen by Dr. Selena Batten on 07/08/2020. Patient states she can only come in on Monday or Tuesday due to work. Patient was offered first available appt with Dr. Clent Ridges on the days requested by patient. Patient also stated eye drops were not working and offered a virtual visit with Dr. Selena Batten due to Dr. Clent Ridges was not available. Pt declined.   Patient called back and sent to triage nurse line and per triage patient states:  ---The caller states that a medication she is taking is causing her to lose her vision and wants to know if she should continue to take it. Taking eye balm to right eye for-started med for allergic reaction to probioticsusing eye ointment for 1 week-blurry right eye started 7 days ago-not improvement-last dose 12 noon todayblurred vision continues. Denies eye pain or swellingdx Conjunctivitis-abx ointment. Blurry vision resolves if does not take med-eye drainage resolved. Started med approx unsure.  ---Pt states she declines triage. -pt states she will stop medication.  pt informs this nurse she has decided to stop the medication and does not need to speak with office nurse about med. Pt requests this nurse have a good day

## 2020-07-25 DIAGNOSIS — M546 Pain in thoracic spine: Secondary | ICD-10-CM | POA: Diagnosis not present

## 2020-07-25 DIAGNOSIS — R293 Abnormal posture: Secondary | ICD-10-CM | POA: Diagnosis not present

## 2020-07-25 DIAGNOSIS — M545 Low back pain, unspecified: Secondary | ICD-10-CM | POA: Diagnosis not present

## 2020-07-25 DIAGNOSIS — M542 Cervicalgia: Secondary | ICD-10-CM | POA: Diagnosis not present

## 2020-07-28 ENCOUNTER — Encounter: Payer: Self-pay | Admitting: Family Medicine

## 2020-07-28 ENCOUNTER — Ambulatory Visit (INDEPENDENT_AMBULATORY_CARE_PROVIDER_SITE_OTHER): Payer: BC Managed Care – PPO | Admitting: Family Medicine

## 2020-07-28 ENCOUNTER — Other Ambulatory Visit: Payer: Self-pay

## 2020-07-28 VITALS — BP 110/68 | HR 78 | Temp 98.4°F | Wt 131.6 lb

## 2020-07-28 DIAGNOSIS — H1031 Unspecified acute conjunctivitis, right eye: Secondary | ICD-10-CM

## 2020-07-28 DIAGNOSIS — Z209 Contact with and (suspected) exposure to unspecified communicable disease: Secondary | ICD-10-CM | POA: Diagnosis not present

## 2020-07-28 DIAGNOSIS — Z113 Encounter for screening for infections with a predominantly sexual mode of transmission: Secondary | ICD-10-CM | POA: Diagnosis not present

## 2020-07-28 DIAGNOSIS — L309 Dermatitis, unspecified: Secondary | ICD-10-CM

## 2020-07-28 MED ORDER — CYCLOBENZAPRINE HCL 10 MG PO TABS: 10.0000 mg | ORAL_TABLET | Freq: Three times a day (TID) | ORAL | 2 refills | Status: DC | PRN

## 2020-07-28 NOTE — Progress Notes (Signed)
   Subjective:    Patient ID: Joyce Cortez, female    DOB: 04-Jan-1981, 40 y.o.   MRN: [id number]  HPI Here for several issues. First she developed a conjunctivitis in the right eye about 3 weeks ago. She had swelling and discomfort and redness and drainage. She had a virtual visit on 07-08-20 and was given Erythromycin ointment. This has now totally resolved but she asks me to check her eye. She has not worn contact lenses since this started. Also a week ago she developed a flaky itchy rash behind the left ear. She started applying cortisone cream to the area, and now it feels better. Lastly she has a new boyfriend and she thinks they will begin having sex soon, so she asks to be screened for STDs. She denies any symptoms.    Review of Systems  Constitutional: Negative.   HENT: Negative.    Eyes:  Positive for discharge and redness.  Respiratory: Negative.    Cardiovascular: Negative.   Genitourinary: Negative.   Skin:  Positive for rash.      Objective:   Physical Exam Constitutional:      Appearance: Normal appearance.     Comments: Wearing glasses   HENT:     Right Ear: Tympanic membrane, ear canal and external ear normal.     Left Ear: Tympanic membrane, ear canal and external ear normal.     Nose: Nose normal.     Mouth/Throat:     Pharynx: Oropharynx is clear.  Eyes:     Extraocular Movements: Extraocular movements intact.     Conjunctiva/sclera: Conjunctivae normal.     Pupils: Pupils are equal, round, and reactive to light.  Cardiovascular:     Rate and Rhythm: Normal rate and regular rhythm.     Pulses: Normal pulses.     Heart sounds: Normal heart sounds.  Pulmonary:     Effort: Pulmonary effort is normal.     Breath sounds: Normal breath sounds.  Lymphadenopathy:     Cervical: No cervical adenopathy.  Skin:    Comments: The skin behind the left ear is clear   Neurological:     Mental Status: She is alert.          Assessment & Plan:  She had  conjunctivitis and this has now cleared with treatment. She apparently had an area of eczema behind the ear and this has cleared. We will also screen for STDs today.  Gershon Crane, MD

## 2020-07-29 DIAGNOSIS — R293 Abnormal posture: Secondary | ICD-10-CM | POA: Diagnosis not present

## 2020-07-29 DIAGNOSIS — M546 Pain in thoracic spine: Secondary | ICD-10-CM | POA: Diagnosis not present

## 2020-07-29 DIAGNOSIS — F4311 Post-traumatic stress disorder, acute: Secondary | ICD-10-CM | POA: Diagnosis not present

## 2020-07-29 DIAGNOSIS — M542 Cervicalgia: Secondary | ICD-10-CM | POA: Diagnosis not present

## 2020-07-29 DIAGNOSIS — M545 Low back pain, unspecified: Secondary | ICD-10-CM | POA: Diagnosis not present

## 2020-07-29 LAB — C. TRACHOMATIS/N. GONORRHOEAE RNA
C. trachomatis RNA, TMA: NOT DETECTED
N. gonorrhoeae RNA, TMA: NOT DETECTED

## 2020-07-29 LAB — RPR: RPR Ser Ql: NONREACTIVE

## 2020-07-29 LAB — HIV ANTIBODY (ROUTINE TESTING W REFLEX): HIV 1&2 Ab, 4th Generation: NONREACTIVE

## 2020-07-30 DIAGNOSIS — M546 Pain in thoracic spine: Secondary | ICD-10-CM | POA: Diagnosis not present

## 2020-07-30 DIAGNOSIS — M542 Cervicalgia: Secondary | ICD-10-CM | POA: Diagnosis not present

## 2020-07-30 DIAGNOSIS — R293 Abnormal posture: Secondary | ICD-10-CM | POA: Diagnosis not present

## 2020-07-30 DIAGNOSIS — M545 Low back pain, unspecified: Secondary | ICD-10-CM | POA: Diagnosis not present

## 2020-08-04 DIAGNOSIS — R293 Abnormal posture: Secondary | ICD-10-CM | POA: Diagnosis not present

## 2020-08-04 DIAGNOSIS — M542 Cervicalgia: Secondary | ICD-10-CM | POA: Diagnosis not present

## 2020-08-04 DIAGNOSIS — M546 Pain in thoracic spine: Secondary | ICD-10-CM | POA: Diagnosis not present

## 2020-08-04 DIAGNOSIS — M545 Low back pain, unspecified: Secondary | ICD-10-CM | POA: Diagnosis not present

## 2020-08-05 DIAGNOSIS — F4311 Post-traumatic stress disorder, acute: Secondary | ICD-10-CM | POA: Diagnosis not present

## 2020-08-07 DIAGNOSIS — J3089 Other allergic rhinitis: Secondary | ICD-10-CM | POA: Diagnosis not present

## 2020-08-11 DIAGNOSIS — J3089 Other allergic rhinitis: Secondary | ICD-10-CM | POA: Diagnosis not present

## 2020-08-12 DIAGNOSIS — F4311 Post-traumatic stress disorder, acute: Secondary | ICD-10-CM | POA: Diagnosis not present

## 2020-08-13 DIAGNOSIS — M542 Cervicalgia: Secondary | ICD-10-CM | POA: Diagnosis not present

## 2020-08-13 DIAGNOSIS — M545 Low back pain, unspecified: Secondary | ICD-10-CM | POA: Diagnosis not present

## 2020-08-13 DIAGNOSIS — M546 Pain in thoracic spine: Secondary | ICD-10-CM | POA: Diagnosis not present

## 2020-08-13 DIAGNOSIS — R293 Abnormal posture: Secondary | ICD-10-CM | POA: Diagnosis not present

## 2020-08-18 DIAGNOSIS — J3089 Other allergic rhinitis: Secondary | ICD-10-CM | POA: Diagnosis not present

## 2020-08-20 DIAGNOSIS — M546 Pain in thoracic spine: Secondary | ICD-10-CM | POA: Diagnosis not present

## 2020-08-20 DIAGNOSIS — R293 Abnormal posture: Secondary | ICD-10-CM | POA: Diagnosis not present

## 2020-08-20 DIAGNOSIS — J3089 Other allergic rhinitis: Secondary | ICD-10-CM | POA: Diagnosis not present

## 2020-08-20 DIAGNOSIS — Z516 Encounter for desensitization to allergens: Secondary | ICD-10-CM | POA: Diagnosis not present

## 2020-08-20 DIAGNOSIS — M545 Low back pain, unspecified: Secondary | ICD-10-CM | POA: Diagnosis not present

## 2020-08-20 DIAGNOSIS — M542 Cervicalgia: Secondary | ICD-10-CM | POA: Diagnosis not present

## 2020-08-21 DIAGNOSIS — F4311 Post-traumatic stress disorder, acute: Secondary | ICD-10-CM | POA: Diagnosis not present

## 2020-09-02 DIAGNOSIS — L219 Seborrheic dermatitis, unspecified: Secondary | ICD-10-CM | POA: Diagnosis not present

## 2020-09-08 ENCOUNTER — Other Ambulatory Visit: Payer: Self-pay | Admitting: Family Medicine

## 2020-09-08 DIAGNOSIS — J3089 Other allergic rhinitis: Secondary | ICD-10-CM | POA: Diagnosis not present

## 2020-09-15 DIAGNOSIS — N915 Oligomenorrhea, unspecified: Secondary | ICD-10-CM | POA: Diagnosis not present

## 2020-09-15 DIAGNOSIS — Z309 Encounter for contraceptive management, unspecified: Secondary | ICD-10-CM | POA: Diagnosis not present

## 2020-09-16 DIAGNOSIS — F4311 Post-traumatic stress disorder, acute: Secondary | ICD-10-CM | POA: Diagnosis not present

## 2020-09-23 DIAGNOSIS — F4311 Post-traumatic stress disorder, acute: Secondary | ICD-10-CM | POA: Diagnosis not present

## 2020-09-24 DIAGNOSIS — M545 Low back pain, unspecified: Secondary | ICD-10-CM | POA: Diagnosis not present

## 2020-09-24 DIAGNOSIS — R293 Abnormal posture: Secondary | ICD-10-CM | POA: Diagnosis not present

## 2020-09-24 DIAGNOSIS — M546 Pain in thoracic spine: Secondary | ICD-10-CM | POA: Diagnosis not present

## 2020-09-24 DIAGNOSIS — M542 Cervicalgia: Secondary | ICD-10-CM | POA: Diagnosis not present

## 2020-09-30 DIAGNOSIS — F4311 Post-traumatic stress disorder, acute: Secondary | ICD-10-CM | POA: Diagnosis not present

## 2020-10-06 DIAGNOSIS — F4311 Post-traumatic stress disorder, acute: Secondary | ICD-10-CM | POA: Diagnosis not present

## 2020-10-07 DIAGNOSIS — J3089 Other allergic rhinitis: Secondary | ICD-10-CM | POA: Diagnosis not present

## 2020-10-13 DIAGNOSIS — R293 Abnormal posture: Secondary | ICD-10-CM | POA: Diagnosis not present

## 2020-10-13 DIAGNOSIS — M542 Cervicalgia: Secondary | ICD-10-CM | POA: Diagnosis not present

## 2020-10-13 DIAGNOSIS — M545 Low back pain, unspecified: Secondary | ICD-10-CM | POA: Diagnosis not present

## 2020-10-13 DIAGNOSIS — M546 Pain in thoracic spine: Secondary | ICD-10-CM | POA: Diagnosis not present

## 2020-10-13 DIAGNOSIS — F4311 Post-traumatic stress disorder, acute: Secondary | ICD-10-CM | POA: Diagnosis not present

## 2020-10-21 DIAGNOSIS — F4311 Post-traumatic stress disorder, acute: Secondary | ICD-10-CM | POA: Diagnosis not present

## 2020-10-23 DIAGNOSIS — F4311 Post-traumatic stress disorder, acute: Secondary | ICD-10-CM | POA: Diagnosis not present

## 2020-10-28 DIAGNOSIS — F4311 Post-traumatic stress disorder, acute: Secondary | ICD-10-CM | POA: Diagnosis not present

## 2020-10-29 DIAGNOSIS — M545 Low back pain, unspecified: Secondary | ICD-10-CM | POA: Diagnosis not present

## 2020-10-29 DIAGNOSIS — M542 Cervicalgia: Secondary | ICD-10-CM | POA: Diagnosis not present

## 2020-10-29 DIAGNOSIS — M546 Pain in thoracic spine: Secondary | ICD-10-CM | POA: Diagnosis not present

## 2020-10-29 DIAGNOSIS — R293 Abnormal posture: Secondary | ICD-10-CM | POA: Diagnosis not present

## 2020-11-03 DIAGNOSIS — M545 Low back pain, unspecified: Secondary | ICD-10-CM | POA: Diagnosis not present

## 2020-11-03 DIAGNOSIS — R293 Abnormal posture: Secondary | ICD-10-CM | POA: Diagnosis not present

## 2020-11-03 DIAGNOSIS — H6122 Impacted cerumen, left ear: Secondary | ICD-10-CM | POA: Diagnosis not present

## 2020-11-03 DIAGNOSIS — M542 Cervicalgia: Secondary | ICD-10-CM | POA: Diagnosis not present

## 2020-11-03 DIAGNOSIS — M546 Pain in thoracic spine: Secondary | ICD-10-CM | POA: Diagnosis not present

## 2020-11-04 DIAGNOSIS — F4311 Post-traumatic stress disorder, acute: Secondary | ICD-10-CM | POA: Diagnosis not present

## 2020-11-11 DIAGNOSIS — F4311 Post-traumatic stress disorder, acute: Secondary | ICD-10-CM | POA: Diagnosis not present

## 2020-11-12 DIAGNOSIS — M545 Low back pain, unspecified: Secondary | ICD-10-CM | POA: Diagnosis not present

## 2020-11-12 DIAGNOSIS — R293 Abnormal posture: Secondary | ICD-10-CM | POA: Diagnosis not present

## 2020-11-12 DIAGNOSIS — M542 Cervicalgia: Secondary | ICD-10-CM | POA: Diagnosis not present

## 2020-11-12 DIAGNOSIS — M546 Pain in thoracic spine: Secondary | ICD-10-CM | POA: Diagnosis not present

## 2020-11-18 DIAGNOSIS — F4311 Post-traumatic stress disorder, acute: Secondary | ICD-10-CM | POA: Diagnosis not present

## 2020-11-25 DIAGNOSIS — R293 Abnormal posture: Secondary | ICD-10-CM | POA: Diagnosis not present

## 2020-11-25 DIAGNOSIS — M542 Cervicalgia: Secondary | ICD-10-CM | POA: Diagnosis not present

## 2020-11-25 DIAGNOSIS — M545 Low back pain, unspecified: Secondary | ICD-10-CM | POA: Diagnosis not present

## 2020-11-25 DIAGNOSIS — F4311 Post-traumatic stress disorder, acute: Secondary | ICD-10-CM | POA: Diagnosis not present

## 2020-11-25 DIAGNOSIS — M546 Pain in thoracic spine: Secondary | ICD-10-CM | POA: Diagnosis not present

## 2020-11-26 DIAGNOSIS — J3089 Other allergic rhinitis: Secondary | ICD-10-CM | POA: Diagnosis not present

## 2020-12-02 DIAGNOSIS — F4311 Post-traumatic stress disorder, acute: Secondary | ICD-10-CM | POA: Diagnosis not present

## 2020-12-02 DIAGNOSIS — M545 Low back pain, unspecified: Secondary | ICD-10-CM | POA: Diagnosis not present

## 2020-12-02 DIAGNOSIS — R293 Abnormal posture: Secondary | ICD-10-CM | POA: Diagnosis not present

## 2020-12-02 DIAGNOSIS — M542 Cervicalgia: Secondary | ICD-10-CM | POA: Diagnosis not present

## 2020-12-02 DIAGNOSIS — M546 Pain in thoracic spine: Secondary | ICD-10-CM | POA: Diagnosis not present

## 2020-12-08 DIAGNOSIS — M545 Low back pain, unspecified: Secondary | ICD-10-CM | POA: Diagnosis not present

## 2020-12-08 DIAGNOSIS — F4311 Post-traumatic stress disorder, acute: Secondary | ICD-10-CM | POA: Diagnosis not present

## 2020-12-08 DIAGNOSIS — M546 Pain in thoracic spine: Secondary | ICD-10-CM | POA: Diagnosis not present

## 2020-12-08 DIAGNOSIS — M542 Cervicalgia: Secondary | ICD-10-CM | POA: Diagnosis not present

## 2020-12-08 DIAGNOSIS — R293 Abnormal posture: Secondary | ICD-10-CM | POA: Diagnosis not present

## 2020-12-09 DIAGNOSIS — R293 Abnormal posture: Secondary | ICD-10-CM | POA: Diagnosis not present

## 2020-12-09 DIAGNOSIS — M542 Cervicalgia: Secondary | ICD-10-CM | POA: Diagnosis not present

## 2020-12-09 DIAGNOSIS — M546 Pain in thoracic spine: Secondary | ICD-10-CM | POA: Diagnosis not present

## 2020-12-09 DIAGNOSIS — M545 Low back pain, unspecified: Secondary | ICD-10-CM | POA: Diagnosis not present

## 2020-12-10 DIAGNOSIS — M545 Low back pain, unspecified: Secondary | ICD-10-CM | POA: Diagnosis not present

## 2020-12-10 DIAGNOSIS — R293 Abnormal posture: Secondary | ICD-10-CM | POA: Diagnosis not present

## 2020-12-10 DIAGNOSIS — M542 Cervicalgia: Secondary | ICD-10-CM | POA: Diagnosis not present

## 2020-12-10 DIAGNOSIS — M546 Pain in thoracic spine: Secondary | ICD-10-CM | POA: Diagnosis not present

## 2020-12-15 DIAGNOSIS — F4311 Post-traumatic stress disorder, acute: Secondary | ICD-10-CM | POA: Diagnosis not present

## 2020-12-15 DIAGNOSIS — M542 Cervicalgia: Secondary | ICD-10-CM | POA: Diagnosis not present

## 2020-12-15 DIAGNOSIS — R293 Abnormal posture: Secondary | ICD-10-CM | POA: Diagnosis not present

## 2020-12-15 DIAGNOSIS — M545 Low back pain, unspecified: Secondary | ICD-10-CM | POA: Diagnosis not present

## 2020-12-15 DIAGNOSIS — M546 Pain in thoracic spine: Secondary | ICD-10-CM | POA: Diagnosis not present

## 2020-12-16 DIAGNOSIS — M546 Pain in thoracic spine: Secondary | ICD-10-CM | POA: Diagnosis not present

## 2020-12-16 DIAGNOSIS — M542 Cervicalgia: Secondary | ICD-10-CM | POA: Diagnosis not present

## 2020-12-16 DIAGNOSIS — M545 Low back pain, unspecified: Secondary | ICD-10-CM | POA: Diagnosis not present

## 2020-12-16 DIAGNOSIS — R293 Abnormal posture: Secondary | ICD-10-CM | POA: Diagnosis not present

## 2020-12-22 DIAGNOSIS — F4311 Post-traumatic stress disorder, acute: Secondary | ICD-10-CM | POA: Diagnosis not present

## 2020-12-23 DIAGNOSIS — M542 Cervicalgia: Secondary | ICD-10-CM | POA: Diagnosis not present

## 2020-12-23 DIAGNOSIS — M546 Pain in thoracic spine: Secondary | ICD-10-CM | POA: Diagnosis not present

## 2020-12-23 DIAGNOSIS — R293 Abnormal posture: Secondary | ICD-10-CM | POA: Diagnosis not present

## 2020-12-23 DIAGNOSIS — M545 Low back pain, unspecified: Secondary | ICD-10-CM | POA: Diagnosis not present

## 2020-12-24 DIAGNOSIS — J3089 Other allergic rhinitis: Secondary | ICD-10-CM | POA: Diagnosis not present

## 2020-12-29 DIAGNOSIS — M542 Cervicalgia: Secondary | ICD-10-CM | POA: Diagnosis not present

## 2020-12-29 DIAGNOSIS — M546 Pain in thoracic spine: Secondary | ICD-10-CM | POA: Diagnosis not present

## 2020-12-29 DIAGNOSIS — M545 Low back pain, unspecified: Secondary | ICD-10-CM | POA: Diagnosis not present

## 2020-12-29 DIAGNOSIS — F4311 Post-traumatic stress disorder, acute: Secondary | ICD-10-CM | POA: Diagnosis not present

## 2020-12-29 DIAGNOSIS — R293 Abnormal posture: Secondary | ICD-10-CM | POA: Diagnosis not present

## 2020-12-30 DIAGNOSIS — M545 Low back pain, unspecified: Secondary | ICD-10-CM | POA: Diagnosis not present

## 2020-12-30 DIAGNOSIS — M546 Pain in thoracic spine: Secondary | ICD-10-CM | POA: Diagnosis not present

## 2020-12-30 DIAGNOSIS — R293 Abnormal posture: Secondary | ICD-10-CM | POA: Diagnosis not present

## 2020-12-30 DIAGNOSIS — M542 Cervicalgia: Secondary | ICD-10-CM | POA: Diagnosis not present

## 2021-01-06 DIAGNOSIS — F4311 Post-traumatic stress disorder, acute: Secondary | ICD-10-CM | POA: Diagnosis not present

## 2021-01-13 DIAGNOSIS — R293 Abnormal posture: Secondary | ICD-10-CM | POA: Diagnosis not present

## 2021-01-13 DIAGNOSIS — M542 Cervicalgia: Secondary | ICD-10-CM | POA: Diagnosis not present

## 2021-01-13 DIAGNOSIS — M546 Pain in thoracic spine: Secondary | ICD-10-CM | POA: Diagnosis not present

## 2021-01-13 DIAGNOSIS — F4311 Post-traumatic stress disorder, acute: Secondary | ICD-10-CM | POA: Diagnosis not present

## 2021-01-13 DIAGNOSIS — M545 Low back pain, unspecified: Secondary | ICD-10-CM | POA: Diagnosis not present

## 2021-01-20 DIAGNOSIS — F4311 Post-traumatic stress disorder, acute: Secondary | ICD-10-CM | POA: Diagnosis not present

## 2021-01-22 DIAGNOSIS — M542 Cervicalgia: Secondary | ICD-10-CM | POA: Diagnosis not present

## 2021-01-22 DIAGNOSIS — M545 Low back pain, unspecified: Secondary | ICD-10-CM | POA: Diagnosis not present

## 2021-01-22 DIAGNOSIS — M546 Pain in thoracic spine: Secondary | ICD-10-CM | POA: Diagnosis not present

## 2021-01-22 DIAGNOSIS — R293 Abnormal posture: Secondary | ICD-10-CM | POA: Diagnosis not present

## 2021-01-26 DIAGNOSIS — R293 Abnormal posture: Secondary | ICD-10-CM | POA: Diagnosis not present

## 2021-01-26 DIAGNOSIS — M542 Cervicalgia: Secondary | ICD-10-CM | POA: Diagnosis not present

## 2021-01-26 DIAGNOSIS — M545 Low back pain, unspecified: Secondary | ICD-10-CM | POA: Diagnosis not present

## 2021-01-26 DIAGNOSIS — F4311 Post-traumatic stress disorder, acute: Secondary | ICD-10-CM | POA: Diagnosis not present

## 2021-01-26 DIAGNOSIS — M546 Pain in thoracic spine: Secondary | ICD-10-CM | POA: Diagnosis not present

## 2021-01-27 DIAGNOSIS — Z01419 Encounter for gynecological examination (general) (routine) without abnormal findings: Secondary | ICD-10-CM | POA: Diagnosis not present

## 2021-01-27 DIAGNOSIS — Z3200 Encounter for pregnancy test, result unknown: Secondary | ICD-10-CM | POA: Diagnosis not present

## 2021-01-27 DIAGNOSIS — Z309 Encounter for contraceptive management, unspecified: Secondary | ICD-10-CM | POA: Diagnosis not present

## 2021-01-27 DIAGNOSIS — R300 Dysuria: Secondary | ICD-10-CM | POA: Diagnosis not present

## 2021-01-27 DIAGNOSIS — N898 Other specified noninflammatory disorders of vagina: Secondary | ICD-10-CM | POA: Diagnosis not present

## 2021-01-27 DIAGNOSIS — Z113 Encounter for screening for infections with a predominantly sexual mode of transmission: Secondary | ICD-10-CM | POA: Diagnosis not present

## 2021-01-27 DIAGNOSIS — Z114 Encounter for screening for human immunodeficiency virus [HIV]: Secondary | ICD-10-CM | POA: Diagnosis not present

## 2021-01-27 DIAGNOSIS — Z1159 Encounter for screening for other viral diseases: Secondary | ICD-10-CM | POA: Diagnosis not present

## 2021-01-27 DIAGNOSIS — Z6823 Body mass index (BMI) 23.0-23.9, adult: Secondary | ICD-10-CM | POA: Diagnosis not present

## 2021-01-27 DIAGNOSIS — Z124 Encounter for screening for malignant neoplasm of cervix: Secondary | ICD-10-CM | POA: Diagnosis not present

## 2021-01-28 DIAGNOSIS — Z118 Encounter for screening for other infectious and parasitic diseases: Secondary | ICD-10-CM | POA: Diagnosis not present

## 2021-01-29 DIAGNOSIS — F4311 Post-traumatic stress disorder, acute: Secondary | ICD-10-CM | POA: Diagnosis not present

## 2021-02-02 DIAGNOSIS — F4311 Post-traumatic stress disorder, acute: Secondary | ICD-10-CM | POA: Diagnosis not present

## 2021-02-09 DIAGNOSIS — F4311 Post-traumatic stress disorder, acute: Secondary | ICD-10-CM | POA: Diagnosis not present

## 2021-02-12 DIAGNOSIS — L700 Acne vulgaris: Secondary | ICD-10-CM | POA: Diagnosis not present

## 2021-02-12 DIAGNOSIS — M546 Pain in thoracic spine: Secondary | ICD-10-CM | POA: Diagnosis not present

## 2021-02-12 DIAGNOSIS — M542 Cervicalgia: Secondary | ICD-10-CM | POA: Diagnosis not present

## 2021-02-12 DIAGNOSIS — M545 Low back pain, unspecified: Secondary | ICD-10-CM | POA: Diagnosis not present

## 2021-02-12 DIAGNOSIS — R293 Abnormal posture: Secondary | ICD-10-CM | POA: Diagnosis not present

## 2021-02-13 DIAGNOSIS — F4311 Post-traumatic stress disorder, acute: Secondary | ICD-10-CM | POA: Diagnosis not present

## 2021-02-16 DIAGNOSIS — F4311 Post-traumatic stress disorder, acute: Secondary | ICD-10-CM | POA: Diagnosis not present

## 2021-02-17 DIAGNOSIS — R293 Abnormal posture: Secondary | ICD-10-CM | POA: Diagnosis not present

## 2021-02-17 DIAGNOSIS — M542 Cervicalgia: Secondary | ICD-10-CM | POA: Diagnosis not present

## 2021-02-17 DIAGNOSIS — M546 Pain in thoracic spine: Secondary | ICD-10-CM | POA: Diagnosis not present

## 2021-02-17 DIAGNOSIS — M545 Low back pain, unspecified: Secondary | ICD-10-CM | POA: Diagnosis not present

## 2021-02-19 DIAGNOSIS — R293 Abnormal posture: Secondary | ICD-10-CM | POA: Diagnosis not present

## 2021-02-19 DIAGNOSIS — M546 Pain in thoracic spine: Secondary | ICD-10-CM | POA: Diagnosis not present

## 2021-02-19 DIAGNOSIS — M542 Cervicalgia: Secondary | ICD-10-CM | POA: Diagnosis not present

## 2021-02-19 DIAGNOSIS — M545 Low back pain, unspecified: Secondary | ICD-10-CM | POA: Diagnosis not present

## 2021-02-23 DIAGNOSIS — Z20822 Contact with and (suspected) exposure to covid-19: Secondary | ICD-10-CM | POA: Diagnosis not present

## 2021-02-24 DIAGNOSIS — M545 Low back pain, unspecified: Secondary | ICD-10-CM | POA: Diagnosis not present

## 2021-02-24 DIAGNOSIS — R293 Abnormal posture: Secondary | ICD-10-CM | POA: Diagnosis not present

## 2021-02-24 DIAGNOSIS — M546 Pain in thoracic spine: Secondary | ICD-10-CM | POA: Diagnosis not present

## 2021-02-24 DIAGNOSIS — M542 Cervicalgia: Secondary | ICD-10-CM | POA: Diagnosis not present

## 2021-03-02 DIAGNOSIS — F4311 Post-traumatic stress disorder, acute: Secondary | ICD-10-CM | POA: Diagnosis not present

## 2021-03-03 DIAGNOSIS — R293 Abnormal posture: Secondary | ICD-10-CM | POA: Diagnosis not present

## 2021-03-03 DIAGNOSIS — M546 Pain in thoracic spine: Secondary | ICD-10-CM | POA: Diagnosis not present

## 2021-03-03 DIAGNOSIS — M542 Cervicalgia: Secondary | ICD-10-CM | POA: Diagnosis not present

## 2021-03-03 DIAGNOSIS — M545 Low back pain, unspecified: Secondary | ICD-10-CM | POA: Diagnosis not present

## 2021-03-09 DIAGNOSIS — F4311 Post-traumatic stress disorder, acute: Secondary | ICD-10-CM | POA: Diagnosis not present

## 2021-03-16 ENCOUNTER — Telehealth: Payer: Self-pay | Admitting: Family Medicine

## 2021-03-16 DIAGNOSIS — F4311 Post-traumatic stress disorder, acute: Secondary | ICD-10-CM | POA: Diagnosis not present

## 2021-03-16 MED ORDER — AMPHETAMINE-DEXTROAMPHETAMINE 20 MG PO TABS: 20.0000 mg | ORAL_TABLET | Freq: Two times a day (BID) | ORAL | 0 refills | Status: DC

## 2021-03-16 NOTE — Telephone Encounter (Signed)
I sent in a one month supply of immediate release Adderall 20 mg BID. There is a shortage of the extended release type. If this works for her we can stay on it

## 2021-03-16 NOTE — Telephone Encounter (Signed)
Pt is calling and would like to get back on generic adderall 20 mg.once a day . Pt is starting certification program and needs to focus  [id number] - [place],  - [address] AT [facility] [address] [facility] [address] Phone:  [phone]  Fax:  [phone]

## 2021-03-16 NOTE — Telephone Encounter (Signed)
Looks like pt last refill was 07/2020 not sure if this is when Rx was stopped. Does pt need to come in for ov to restart? Please advise

## 2021-03-17 NOTE — Telephone Encounter (Signed)
Lvm for patient.

## 2021-03-23 DIAGNOSIS — F4311 Post-traumatic stress disorder, acute: Secondary | ICD-10-CM | POA: Diagnosis not present

## 2021-03-30 DIAGNOSIS — F4311 Post-traumatic stress disorder, acute: Secondary | ICD-10-CM | POA: Diagnosis not present

## 2021-04-06 DIAGNOSIS — F4311 Post-traumatic stress disorder, acute: Secondary | ICD-10-CM | POA: Diagnosis not present

## 2021-04-13 DIAGNOSIS — F4311 Post-traumatic stress disorder, acute: Secondary | ICD-10-CM | POA: Diagnosis not present

## 2021-05-04 DIAGNOSIS — F4311 Post-traumatic stress disorder, acute: Secondary | ICD-10-CM | POA: Diagnosis not present

## 2021-05-13 DIAGNOSIS — F4311 Post-traumatic stress disorder, acute: Secondary | ICD-10-CM | POA: Diagnosis not present

## 2021-05-18 DIAGNOSIS — F4311 Post-traumatic stress disorder, acute: Secondary | ICD-10-CM | POA: Diagnosis not present

## 2021-05-22 ENCOUNTER — Ambulatory Visit (INDEPENDENT_AMBULATORY_CARE_PROVIDER_SITE_OTHER): Payer: BC Managed Care – PPO | Admitting: Family Medicine

## 2021-05-22 ENCOUNTER — Encounter: Payer: Self-pay | Admitting: Family Medicine

## 2021-05-22 VITALS — BP 110/72 | HR 66 | Temp 98.5°F | Wt 144.0 lb

## 2021-05-22 DIAGNOSIS — J3089 Other allergic rhinitis: Secondary | ICD-10-CM

## 2021-05-22 DIAGNOSIS — J3489 Other specified disorders of nose and nasal sinuses: Secondary | ICD-10-CM

## 2021-05-22 DIAGNOSIS — F419 Anxiety disorder, unspecified: Secondary | ICD-10-CM | POA: Diagnosis not present

## 2021-05-22 DIAGNOSIS — F900 Attention-deficit hyperactivity disorder, predominantly inattentive type: Secondary | ICD-10-CM | POA: Diagnosis not present

## 2021-05-22 DIAGNOSIS — M797 Fibromyalgia: Secondary | ICD-10-CM | POA: Diagnosis not present

## 2021-05-22 MED ORDER — ALPRAZOLAM ER 0.5 MG PO TB24
0.5000 mg | ORAL_TABLET | Freq: Two times a day (BID) | ORAL | 5 refills | Status: DC | PRN
Start: 2021-05-22 — End: 2022-08-11

## 2021-05-22 MED ORDER — CYCLOBENZAPRINE HCL 10 MG PO TABS: 10.0000 mg | ORAL_TABLET | Freq: Three times a day (TID) | ORAL | 5 refills | Status: DC | PRN

## 2021-05-22 MED ORDER — MUPIROCIN 2 % EX OINT
1.0000 | TOPICAL_OINTMENT | Freq: Two times a day (BID) | CUTANEOUS | 2 refills | Status: DC
Start: 2021-05-22 — End: 2021-07-13

## 2021-05-22 NOTE — Progress Notes (Signed)
? ?  Subjective:  ? ? Patient ID: Joyce Cortez, female    DOB: June 30, 1980, 41 y.o.   MRN: [id number] ? ?HPI ?Here for several issues. First she has had allergy issues for several months, with stuffy nose and head, PND, and sneezing. She is using Allegra but this is not helping the way it used to. She had been seeing [facility], but she stopped seeing them in December. Also the inside of the nostrils has been very irritated with crusting and sometimes bleeding. She uses Azelastine sprays BID. Her fibromyalgia has been flaring up and she asks to refill the Flexeril. Her anxiety has been stable but she needs refills on Xanax.  ? ? ?Review of Systems  ?Constitutional: Negative.   ?HENT:  Positive for congestion, postnasal drip, rhinorrhea and sneezing. Negative for ear pain, facial swelling and sore throat.   ?Eyes:  Positive for itching. Negative for discharge and redness.  ?Respiratory:  Positive for cough and chest tightness.   ?Cardiovascular: Negative.   ?Musculoskeletal:  Positive for myalgias.  ?Psychiatric/Behavioral:  Negative for dysphoric mood and sleep disturbance. The patient is nervous/anxious.   ? ?   ?Objective:  ? Physical Exam ?Constitutional:   ?   Appearance: Normal appearance. She is not ill-appearing.  ?HENT:  ?   Right Ear: Tympanic membrane, ear canal and external ear normal.  ?   Left Ear: Tympanic membrane, ear canal and external ear normal.  ?   Nose:  ?   Comments: The inside of both nostrils is red, inflamed, and crusty  ?   Mouth/Throat:  ?   Pharynx: Oropharynx is clear.  ?Eyes:  ?   Conjunctiva/sclera: Conjunctivae normal.  ?Pulmonary:  ?   Effort: Pulmonary effort is normal.  ?   Breath sounds: Normal breath sounds.  ?Lymphadenopathy:  ?   Cervical: No cervical adenopathy.  ?Neurological:  ?   Mental Status: She is alert.  ?Psychiatric:     ?   Mood and Affect: Mood normal.     ?   Behavior: Behavior normal.     ?   Thought Content: Thought content normal.  ? ? ? ? ? ?    ?Assessment & Plan:  ?For the fibromyalgia, we refilled the Flexeril. For the anxiety, we refilled the Xanax. For her seasonal allergies, I suggested she try Xyzal instead of Allegra. For the nasal dryness, she can add saline nasal sprays between the Azelastine sprays. She appears to have a MRSA infection in the nostrils, and we will treat this with Mupiricin ointment. We spent a total of ( 33  ) minutes reviewing records and discussing these issues.  ?Gershon Crane, MD ? ? ?

## 2021-05-27 DIAGNOSIS — F4311 Post-traumatic stress disorder, acute: Secondary | ICD-10-CM | POA: Diagnosis not present

## 2021-05-28 DIAGNOSIS — M546 Pain in thoracic spine: Secondary | ICD-10-CM | POA: Diagnosis not present

## 2021-05-28 DIAGNOSIS — M545 Low back pain, unspecified: Secondary | ICD-10-CM | POA: Diagnosis not present

## 2021-05-28 DIAGNOSIS — R293 Abnormal posture: Secondary | ICD-10-CM | POA: Diagnosis not present

## 2021-05-28 DIAGNOSIS — M542 Cervicalgia: Secondary | ICD-10-CM | POA: Diagnosis not present

## 2021-06-01 DIAGNOSIS — F4311 Post-traumatic stress disorder, acute: Secondary | ICD-10-CM | POA: Diagnosis not present

## 2021-06-02 ENCOUNTER — Ambulatory Visit: Payer: BC Managed Care – PPO | Admitting: Family Medicine

## 2021-06-08 DIAGNOSIS — F4311 Post-traumatic stress disorder, acute: Secondary | ICD-10-CM | POA: Diagnosis not present

## 2021-06-09 DIAGNOSIS — M545 Low back pain, unspecified: Secondary | ICD-10-CM | POA: Diagnosis not present

## 2021-06-09 DIAGNOSIS — M542 Cervicalgia: Secondary | ICD-10-CM | POA: Diagnosis not present

## 2021-06-09 DIAGNOSIS — R293 Abnormal posture: Secondary | ICD-10-CM | POA: Diagnosis not present

## 2021-06-09 DIAGNOSIS — M546 Pain in thoracic spine: Secondary | ICD-10-CM | POA: Diagnosis not present

## 2021-06-12 ENCOUNTER — Ambulatory Visit (INDEPENDENT_AMBULATORY_CARE_PROVIDER_SITE_OTHER): Payer: BC Managed Care – PPO | Admitting: Family Medicine

## 2021-06-12 ENCOUNTER — Ambulatory Visit (INDEPENDENT_AMBULATORY_CARE_PROVIDER_SITE_OTHER): Payer: BC Managed Care – PPO

## 2021-06-12 ENCOUNTER — Encounter: Payer: Self-pay | Admitting: Family Medicine

## 2021-06-12 VITALS — BP 110/64 | HR 68 | Temp 98.4°F | Ht 64.0 in | Wt 148.0 lb

## 2021-06-12 DIAGNOSIS — M25522 Pain in left elbow: Secondary | ICD-10-CM

## 2021-06-12 DIAGNOSIS — R519 Headache, unspecified: Secondary | ICD-10-CM

## 2021-06-12 DIAGNOSIS — L309 Dermatitis, unspecified: Secondary | ICD-10-CM | POA: Diagnosis not present

## 2021-06-12 DIAGNOSIS — G4730 Sleep apnea, unspecified: Secondary | ICD-10-CM | POA: Diagnosis not present

## 2021-06-12 MED ORDER — TRIAMCINOLONE ACETONIDE 0.1 % EX CREA: 1.0000 "application " | TOPICAL_CREAM | Freq: Two times a day (BID) | CUTANEOUS | 5 refills | Status: DC

## 2021-06-12 NOTE — Progress Notes (Signed)
   Subjective:    Patient ID: Joyce Cortez, female    DOB: 12-06-1980, 41 y.o.   MRN: [id number]  HPI Here for several issues. First she asks if she needs imaging of her head because she occasionally gets a mild headache. These are not hard to deal with, but several days ago she remembered that she hit the back of her head on a headboard when she was about 41 years old. She wants to make "everything is okay". No other neurologic complaints today. Second she has had pain in the left elbow for the past 2 weeks. She cannot remember any trauma, but she thinks she may have injured it while having sex. It has been feeling better the past few days. Also she thinks she has sleep apnea. She does not snore, but her boyfriend says she is a restless sleeper and she often stops breathing during her sleep. Lastly she often has itchy and scaly skin behind the left ear lobe.    Review of Systems  Constitutional: Negative.   Respiratory: Negative.    Cardiovascular: Negative.   Musculoskeletal:  Positive for arthralgias.  Neurological:  Positive for headaches.      Objective:   Physical Exam Constitutional:      Appearance: Normal appearance. She is not ill-appearing.  HENT:     Head: Normocephalic and atraumatic.  Cardiovascular:     Rate and Rhythm: Normal rate and regular rhythm.     Pulses: Normal pulses.     Heart sounds: Normal heart sounds.  Pulmonary:     Effort: Pulmonary effort is normal.     Breath sounds: Normal breath sounds.  Musculoskeletal:     Comments: Left elbow appears normal with no swelling or redness or warmth. She is mildly tender over the medial epicondyle. ROM is full  Skin:    Comments: The skin behind the left ear lobe is mildly erythematous   Neurological:     General: No focal deficit present.     Mental Status: She is alert and oriented to person, place, and time. Mental status is at baseline.          Assessment & Plan:  First of all, I reassured her that  her head is fine, and I reminded her that she had a head CT scan on 07-02-16 that was totally normal. Second she appears to have a mild contusion to the left elbow. We will get Xrays today to evaluate. Third she has some eczema behind the ear, and she can apply Triancinolone cream as needed. Lastly we will refer her to Pulmonology to evaluate for possible sleep apnea.  Gershon Crane, MD

## 2021-06-17 DIAGNOSIS — F4311 Post-traumatic stress disorder, acute: Secondary | ICD-10-CM | POA: Diagnosis not present

## 2021-06-22 DIAGNOSIS — F4311 Post-traumatic stress disorder, acute: Secondary | ICD-10-CM | POA: Diagnosis not present

## 2021-06-23 ENCOUNTER — Encounter: Payer: Self-pay | Admitting: Family Medicine

## 2021-06-23 ENCOUNTER — Ambulatory Visit (INDEPENDENT_AMBULATORY_CARE_PROVIDER_SITE_OTHER): Payer: BC Managed Care – PPO | Admitting: Family Medicine

## 2021-06-23 VITALS — BP 108/76 | HR 80 | Temp 98.4°F | Wt 141.0 lb

## 2021-06-23 DIAGNOSIS — R293 Abnormal posture: Secondary | ICD-10-CM | POA: Diagnosis not present

## 2021-06-23 DIAGNOSIS — M542 Cervicalgia: Secondary | ICD-10-CM | POA: Diagnosis not present

## 2021-06-23 DIAGNOSIS — S5002XD Contusion of left elbow, subsequent encounter: Secondary | ICD-10-CM | POA: Diagnosis not present

## 2021-06-23 DIAGNOSIS — M546 Pain in thoracic spine: Secondary | ICD-10-CM | POA: Diagnosis not present

## 2021-06-23 DIAGNOSIS — M545 Low back pain, unspecified: Secondary | ICD-10-CM | POA: Diagnosis not present

## 2021-06-23 NOTE — Progress Notes (Signed)
   Subjective:    Patient ID: Joyce Cortez, female    DOB: Apr 11, 1980, 41 y.o.   MRN: [id number]  HPI: Here to recheck her left elbow after she apparently struck it on a counter top about 3 weeks ago. She was here on 06-12-21 and she was tender over the medial epicondyle. Her ROm was intact. Xrays that day revealed no fractures. Since then the localozed swelling has come down and the tenderness is lessening. She was concerned because she had felt a "lump" that has since gotten smaller.    Review of Systems     Objective:   Physical Exam Constitutional:      Appearance: Normal appearance.  Cardiovascular:     Rate and Rhythm: Normal rate and regular rhythm.     Pulses: Normal pulses.     Heart sounds: Normal heart sounds.  Pulmonary:     Effort: Pulmonary effort is normal.     Breath sounds: Normal breath sounds.  Musculoskeletal:     Comments: There is a small ecchymosis over the left medial epicondyle. This area is mildly tender. There is no swelling. ROM is full   Neurological:     Mental Status: She is alert.          Assessment & Plan:  She has an elbow contusion that is healing as expected. I think the "lump" she is referring to was a bursa effusion ,which has now resolved.  Gershon Crane, MD

## 2021-06-25 DIAGNOSIS — M542 Cervicalgia: Secondary | ICD-10-CM | POA: Diagnosis not present

## 2021-06-25 DIAGNOSIS — M546 Pain in thoracic spine: Secondary | ICD-10-CM | POA: Diagnosis not present

## 2021-06-25 DIAGNOSIS — R293 Abnormal posture: Secondary | ICD-10-CM | POA: Diagnosis not present

## 2021-06-25 DIAGNOSIS — M79605 Pain in left leg: Secondary | ICD-10-CM | POA: Diagnosis not present

## 2021-06-29 DIAGNOSIS — F4311 Post-traumatic stress disorder, acute: Secondary | ICD-10-CM | POA: Diagnosis not present

## 2021-06-30 DIAGNOSIS — R293 Abnormal posture: Secondary | ICD-10-CM | POA: Diagnosis not present

## 2021-06-30 DIAGNOSIS — M79605 Pain in left leg: Secondary | ICD-10-CM | POA: Diagnosis not present

## 2021-06-30 DIAGNOSIS — M542 Cervicalgia: Secondary | ICD-10-CM | POA: Diagnosis not present

## 2021-06-30 DIAGNOSIS — M546 Pain in thoracic spine: Secondary | ICD-10-CM | POA: Diagnosis not present

## 2021-07-02 DIAGNOSIS — M546 Pain in thoracic spine: Secondary | ICD-10-CM | POA: Diagnosis not present

## 2021-07-02 DIAGNOSIS — M542 Cervicalgia: Secondary | ICD-10-CM | POA: Diagnosis not present

## 2021-07-02 DIAGNOSIS — R293 Abnormal posture: Secondary | ICD-10-CM | POA: Diagnosis not present

## 2021-07-02 DIAGNOSIS — M79605 Pain in left leg: Secondary | ICD-10-CM | POA: Diagnosis not present

## 2021-07-06 DIAGNOSIS — F4311 Post-traumatic stress disorder, acute: Secondary | ICD-10-CM | POA: Diagnosis not present

## 2021-07-07 ENCOUNTER — Ambulatory Visit (INDEPENDENT_AMBULATORY_CARE_PROVIDER_SITE_OTHER): Payer: BC Managed Care – PPO | Admitting: Family Medicine

## 2021-07-07 ENCOUNTER — Encounter: Payer: Self-pay | Admitting: Family Medicine

## 2021-07-07 VITALS — BP 110/72 | HR 78 | Temp 98.2°F | Wt 144.2 lb

## 2021-07-07 DIAGNOSIS — M79605 Pain in left leg: Secondary | ICD-10-CM | POA: Diagnosis not present

## 2021-07-07 DIAGNOSIS — L03011 Cellulitis of right finger: Secondary | ICD-10-CM

## 2021-07-07 DIAGNOSIS — M2550 Pain in unspecified joint: Secondary | ICD-10-CM

## 2021-07-07 DIAGNOSIS — L03012 Cellulitis of left finger: Secondary | ICD-10-CM | POA: Insufficient documentation

## 2021-07-07 DIAGNOSIS — M546 Pain in thoracic spine: Secondary | ICD-10-CM | POA: Diagnosis not present

## 2021-07-07 DIAGNOSIS — M542 Cervicalgia: Secondary | ICD-10-CM | POA: Diagnosis not present

## 2021-07-07 DIAGNOSIS — R293 Abnormal posture: Secondary | ICD-10-CM | POA: Diagnosis not present

## 2021-07-07 MED ORDER — CEPHALEXIN 500 MG PO CAPS: 500.0000 mg | ORAL_CAPSULE | Freq: Three times a day (TID) | ORAL | 0 refills | Status: DC

## 2021-07-07 NOTE — Progress Notes (Signed)
   Subjective:    Patient ID: Joyce Cortez, female    DOB: [birth year], 41 y.o.   MRN: [id number]  HPI Here for 2 issues. First about 5 days ago she noticed pain and swelling around the nail of the right index finger. Also she asks for a referral to see a rheumatologist for her joint pains. These have been present for years, but over the past year they have become more of a problem. She takes Ibuprofen as needed, but she gets only mild relief. No joint swelling.    Review of Systems  Constitutional: Negative.   Respiratory: Negative.    Cardiovascular: Negative.   Musculoskeletal:  Positive for arthralgias.  Skin:  Positive for color change.       Objective:   Physical Exam Constitutional:      Appearance: Normal appearance.  Cardiovascular:     Rate and Rhythm: Normal rate and regular rhythm.     Pulses: Normal pulses.     Heart sounds: Normal heart sounds.  Pulmonary:     Effort: Pulmonary effort is normal.     Breath sounds: Normal breath sounds.  Musculoskeletal:        General: No swelling, tenderness or deformity.  Skin:    Comments: The skin around the right index nail is red and tender   Neurological:     Mental Status: She is alert.           Assessment & Plan:  She has a paronychia, and we will treat this with 7 days of Keflex. We will refer her to rheumatology for the joint pains.  Gershon Crane, MD

## 2021-07-08 ENCOUNTER — Telehealth: Payer: Self-pay | Admitting: Family Medicine

## 2021-07-08 NOTE — Telephone Encounter (Signed)
This would be extremely unusual for the Cephalexin. She should try to finish it up. She can use Imodium with it if needed

## 2021-07-08 NOTE — Telephone Encounter (Signed)
Pt called to say she started the  cephALEXin (KEFLEX) 500 MG capsule Last night and woke up with diarrhea.  She is wondering if this is normal. She is also wondering if MD should change this medication for something that will not give her diarrhea. She is aware that the medication is only for 3 times a day for 7 days. Please advise.

## 2021-07-09 NOTE — Telephone Encounter (Signed)
Spoke with patient message given.   Voiced understanding, stated will try to finish taking medication, feels she ate wrong foods the other day.

## 2021-07-13 ENCOUNTER — Other Ambulatory Visit: Payer: Self-pay

## 2021-07-13 ENCOUNTER — Telehealth: Payer: Self-pay | Admitting: Family Medicine

## 2021-07-13 ENCOUNTER — Other Ambulatory Visit: Payer: Self-pay | Admitting: Family Medicine

## 2021-07-13 DIAGNOSIS — L03011 Cellulitis of right finger: Secondary | ICD-10-CM

## 2021-07-13 MED ORDER — MUPIROCIN 2 % EX OINT: 1.0000 | TOPICAL_OINTMENT | Freq: Two times a day (BID) | CUTANEOUS | 2 refills | Status: DC

## 2021-07-13 MED ORDER — CEPHALEXIN 500 MG PO CAPS: 500.0000 mg | ORAL_CAPSULE | Freq: Three times a day (TID) | ORAL | 0 refills | Status: DC

## 2021-07-13 NOTE — Telephone Encounter (Signed)
Last Ov 07/07/21 mupirocin ointment (BACTROBAN) 2 % Filled 05/22/21 Is it ok to refill or does she need Ov Please advise

## 2021-07-16 DIAGNOSIS — F4311 Post-traumatic stress disorder, acute: Secondary | ICD-10-CM | POA: Diagnosis not present

## 2021-07-20 DIAGNOSIS — F4311 Post-traumatic stress disorder, acute: Secondary | ICD-10-CM | POA: Diagnosis not present

## 2021-07-27 DIAGNOSIS — F4311 Post-traumatic stress disorder, acute: Secondary | ICD-10-CM | POA: Diagnosis not present

## 2021-07-30 DIAGNOSIS — R293 Abnormal posture: Secondary | ICD-10-CM | POA: Diagnosis not present

## 2021-07-30 DIAGNOSIS — M542 Cervicalgia: Secondary | ICD-10-CM | POA: Diagnosis not present

## 2021-07-30 DIAGNOSIS — M546 Pain in thoracic spine: Secondary | ICD-10-CM | POA: Diagnosis not present

## 2021-07-30 DIAGNOSIS — M79605 Pain in left leg: Secondary | ICD-10-CM | POA: Diagnosis not present

## 2021-08-03 DIAGNOSIS — F4311 Post-traumatic stress disorder, acute: Secondary | ICD-10-CM | POA: Diagnosis not present

## 2021-08-04 DIAGNOSIS — M79605 Pain in left leg: Secondary | ICD-10-CM | POA: Diagnosis not present

## 2021-08-04 DIAGNOSIS — M542 Cervicalgia: Secondary | ICD-10-CM | POA: Diagnosis not present

## 2021-08-04 DIAGNOSIS — R293 Abnormal posture: Secondary | ICD-10-CM | POA: Diagnosis not present

## 2021-08-04 DIAGNOSIS — M546 Pain in thoracic spine: Secondary | ICD-10-CM | POA: Diagnosis not present

## 2021-08-10 DIAGNOSIS — F4311 Post-traumatic stress disorder, acute: Secondary | ICD-10-CM | POA: Diagnosis not present

## 2021-08-17 DIAGNOSIS — F4311 Post-traumatic stress disorder, acute: Secondary | ICD-10-CM | POA: Diagnosis not present

## 2021-08-20 DIAGNOSIS — M542 Cervicalgia: Secondary | ICD-10-CM | POA: Diagnosis not present

## 2021-08-20 DIAGNOSIS — M546 Pain in thoracic spine: Secondary | ICD-10-CM | POA: Diagnosis not present

## 2021-08-20 DIAGNOSIS — R293 Abnormal posture: Secondary | ICD-10-CM | POA: Diagnosis not present

## 2021-08-20 DIAGNOSIS — M79605 Pain in left leg: Secondary | ICD-10-CM | POA: Diagnosis not present

## 2021-08-21 ENCOUNTER — Ambulatory Visit: Payer: BC Managed Care – PPO | Admitting: Family Medicine

## 2021-08-24 DIAGNOSIS — F4311 Post-traumatic stress disorder, acute: Secondary | ICD-10-CM | POA: Diagnosis not present

## 2021-08-26 ENCOUNTER — Ambulatory Visit: Payer: BC Managed Care – PPO | Admitting: Family Medicine

## 2021-08-27 DIAGNOSIS — M79605 Pain in left leg: Secondary | ICD-10-CM | POA: Diagnosis not present

## 2021-08-27 DIAGNOSIS — M546 Pain in thoracic spine: Secondary | ICD-10-CM | POA: Diagnosis not present

## 2021-08-27 DIAGNOSIS — R293 Abnormal posture: Secondary | ICD-10-CM | POA: Diagnosis not present

## 2021-08-27 DIAGNOSIS — M542 Cervicalgia: Secondary | ICD-10-CM | POA: Diagnosis not present

## 2021-08-31 DIAGNOSIS — F4311 Post-traumatic stress disorder, acute: Secondary | ICD-10-CM | POA: Diagnosis not present

## 2021-09-03 DIAGNOSIS — M79605 Pain in left leg: Secondary | ICD-10-CM | POA: Diagnosis not present

## 2021-09-03 DIAGNOSIS — R293 Abnormal posture: Secondary | ICD-10-CM | POA: Diagnosis not present

## 2021-09-03 DIAGNOSIS — M546 Pain in thoracic spine: Secondary | ICD-10-CM | POA: Diagnosis not present

## 2021-09-03 DIAGNOSIS — M542 Cervicalgia: Secondary | ICD-10-CM | POA: Diagnosis not present

## 2021-09-07 ENCOUNTER — Encounter: Payer: Self-pay | Admitting: Family Medicine

## 2021-09-07 ENCOUNTER — Ambulatory Visit (INDEPENDENT_AMBULATORY_CARE_PROVIDER_SITE_OTHER): Payer: BC Managed Care – PPO | Admitting: Family Medicine

## 2021-09-07 VITALS — BP 112/70 | HR 80 | Temp 99.0°F | Wt 155.0 lb

## 2021-09-07 DIAGNOSIS — F4311 Post-traumatic stress disorder, acute: Secondary | ICD-10-CM | POA: Diagnosis not present

## 2021-09-07 DIAGNOSIS — N3281 Overactive bladder: Secondary | ICD-10-CM | POA: Diagnosis not present

## 2021-09-07 LAB — POC URINALSYSI DIPSTICK (AUTOMATED)
Bilirubin, UA: NEGATIVE
Blood, UA: NEGATIVE
Glucose, UA: NEGATIVE
Ketones, UA: NEGATIVE
Leukocytes, UA: NEGATIVE
Nitrite, UA: NEGATIVE
Protein, UA: NEGATIVE
Spec Grav, UA: <=1.005 — AB (ref 1.010–1.025)
Urobilinogen, UA: 0.2 E.U./dL
pH, UA: 7 (ref 5.0–8.0)

## 2021-09-07 MED ORDER — SOLIFENACIN SUCCINATE 10 MG PO TABS: 10.0000 mg | ORAL_TABLET | Freq: Every day | ORAL | 2 refills | Status: DC

## 2021-09-07 NOTE — Addendum Note (Signed)
Addended by: Carola Rhine on: 09/07/2021 04:27 PM   Modules accepted: Orders

## 2021-09-07 NOTE — Progress Notes (Signed)
   Subjective:    Patient ID: Joyce Cortez, female    DOB: 09-22-1980, 41 y.o.   MRN: [id number]  HPI Here for 6 months of frequent urinations with urgency and frequent bouts of incontinence. There is no burning or discomfort. She tries to drink plenty of water.    Review of Systems  Constitutional: Negative.   Respiratory: Negative.    Cardiovascular: Negative.   Genitourinary:  Positive for frequency and urgency. Negative for dysuria, flank pain, hematuria and pelvic pain.       Objective:   Physical Exam Constitutional:      Appearance: Normal appearance.  Cardiovascular:     Rate and Rhythm: Normal rate and regular rhythm.     Pulses: Normal pulses.     Heart sounds: Normal heart sounds.  Pulmonary:     Effort: Pulmonary effort is normal.     Breath sounds: Normal breath sounds.  Abdominal:     General: There is no distension.     Palpations: There is no mass.     Tenderness: There is no abdominal tenderness. There is no right CVA tenderness, left CVA tenderness, guarding or rebound.     Hernia: No hernia is present.  Neurological:     Mental Status: She is alert.   Her UA today is clear.         Assessment & Plan:  OAB. She will try Vesicare 10 mg daily. Refer to Urology.  Gershon Crane, MD

## 2021-09-10 DIAGNOSIS — R293 Abnormal posture: Secondary | ICD-10-CM | POA: Diagnosis not present

## 2021-09-10 DIAGNOSIS — M546 Pain in thoracic spine: Secondary | ICD-10-CM | POA: Diagnosis not present

## 2021-09-10 DIAGNOSIS — M79605 Pain in left leg: Secondary | ICD-10-CM | POA: Diagnosis not present

## 2021-09-10 DIAGNOSIS — M542 Cervicalgia: Secondary | ICD-10-CM | POA: Diagnosis not present

## 2021-09-14 DIAGNOSIS — F4311 Post-traumatic stress disorder, acute: Secondary | ICD-10-CM | POA: Diagnosis not present

## 2021-09-17 DIAGNOSIS — M791 Myalgia, unspecified site: Secondary | ICD-10-CM | POA: Diagnosis not present

## 2021-09-17 DIAGNOSIS — M79672 Pain in left foot: Secondary | ICD-10-CM | POA: Diagnosis not present

## 2021-09-17 DIAGNOSIS — M79671 Pain in right foot: Secondary | ICD-10-CM | POA: Diagnosis not present

## 2021-09-17 DIAGNOSIS — M25572 Pain in left ankle and joints of left foot: Secondary | ICD-10-CM | POA: Diagnosis not present

## 2021-09-17 DIAGNOSIS — M79642 Pain in left hand: Secondary | ICD-10-CM | POA: Diagnosis not present

## 2021-09-17 DIAGNOSIS — M47819 Spondylosis without myelopathy or radiculopathy, site unspecified: Secondary | ICD-10-CM | POA: Diagnosis not present

## 2021-09-17 DIAGNOSIS — M25571 Pain in right ankle and joints of right foot: Secondary | ICD-10-CM | POA: Diagnosis not present

## 2021-09-17 DIAGNOSIS — M2550 Pain in unspecified joint: Secondary | ICD-10-CM | POA: Diagnosis not present

## 2021-09-17 DIAGNOSIS — M1990 Unspecified osteoarthritis, unspecified site: Secondary | ICD-10-CM | POA: Diagnosis not present

## 2021-09-17 DIAGNOSIS — M79641 Pain in right hand: Secondary | ICD-10-CM | POA: Diagnosis not present

## 2021-09-23 DIAGNOSIS — F4311 Post-traumatic stress disorder, acute: Secondary | ICD-10-CM | POA: Diagnosis not present

## 2021-09-28 DIAGNOSIS — F4311 Post-traumatic stress disorder, acute: Secondary | ICD-10-CM | POA: Diagnosis not present

## 2021-09-30 DIAGNOSIS — N898 Other specified noninflammatory disorders of vagina: Secondary | ICD-10-CM | POA: Diagnosis not present

## 2021-09-30 DIAGNOSIS — N942 Vaginismus: Secondary | ICD-10-CM | POA: Diagnosis not present

## 2021-10-05 DIAGNOSIS — F4311 Post-traumatic stress disorder, acute: Secondary | ICD-10-CM | POA: Diagnosis not present

## 2021-10-12 DIAGNOSIS — F4311 Post-traumatic stress disorder, acute: Secondary | ICD-10-CM | POA: Diagnosis not present

## 2021-10-14 DIAGNOSIS — M546 Pain in thoracic spine: Secondary | ICD-10-CM | POA: Diagnosis not present

## 2021-10-14 DIAGNOSIS — R293 Abnormal posture: Secondary | ICD-10-CM | POA: Diagnosis not present

## 2021-10-14 DIAGNOSIS — M542 Cervicalgia: Secondary | ICD-10-CM | POA: Diagnosis not present

## 2021-10-15 DIAGNOSIS — N760 Acute vaginitis: Secondary | ICD-10-CM | POA: Diagnosis not present

## 2021-10-22 DIAGNOSIS — M542 Cervicalgia: Secondary | ICD-10-CM | POA: Diagnosis not present

## 2021-10-22 DIAGNOSIS — R293 Abnormal posture: Secondary | ICD-10-CM | POA: Diagnosis not present

## 2021-10-22 DIAGNOSIS — M546 Pain in thoracic spine: Secondary | ICD-10-CM | POA: Diagnosis not present

## 2021-10-23 DIAGNOSIS — N898 Other specified noninflammatory disorders of vagina: Secondary | ICD-10-CM | POA: Diagnosis not present

## 2021-10-23 DIAGNOSIS — B3731 Acute candidiasis of vulva and vagina: Secondary | ICD-10-CM | POA: Diagnosis not present

## 2021-10-29 DIAGNOSIS — M2550 Pain in unspecified joint: Secondary | ICD-10-CM | POA: Diagnosis not present

## 2021-11-05 DIAGNOSIS — R293 Abnormal posture: Secondary | ICD-10-CM | POA: Diagnosis not present

## 2021-11-05 DIAGNOSIS — M546 Pain in thoracic spine: Secondary | ICD-10-CM | POA: Diagnosis not present

## 2021-11-05 DIAGNOSIS — M542 Cervicalgia: Secondary | ICD-10-CM | POA: Diagnosis not present

## 2021-11-09 DIAGNOSIS — F4311 Post-traumatic stress disorder, acute: Secondary | ICD-10-CM | POA: Diagnosis not present

## 2021-11-12 DIAGNOSIS — M546 Pain in thoracic spine: Secondary | ICD-10-CM | POA: Diagnosis not present

## 2021-11-12 DIAGNOSIS — R293 Abnormal posture: Secondary | ICD-10-CM | POA: Diagnosis not present

## 2021-11-12 DIAGNOSIS — M542 Cervicalgia: Secondary | ICD-10-CM | POA: Diagnosis not present

## 2021-11-16 DIAGNOSIS — F4311 Post-traumatic stress disorder, acute: Secondary | ICD-10-CM | POA: Diagnosis not present

## 2021-11-23 DIAGNOSIS — F4311 Post-traumatic stress disorder, acute: Secondary | ICD-10-CM | POA: Diagnosis not present

## 2021-11-30 DIAGNOSIS — F4311 Post-traumatic stress disorder, acute: Secondary | ICD-10-CM | POA: Diagnosis not present

## 2021-12-02 DIAGNOSIS — F4311 Post-traumatic stress disorder, acute: Secondary | ICD-10-CM | POA: Diagnosis not present

## 2021-12-03 ENCOUNTER — Other Ambulatory Visit: Payer: Self-pay | Admitting: Family Medicine

## 2021-12-03 DIAGNOSIS — M546 Pain in thoracic spine: Secondary | ICD-10-CM | POA: Diagnosis not present

## 2021-12-03 DIAGNOSIS — M542 Cervicalgia: Secondary | ICD-10-CM | POA: Diagnosis not present

## 2021-12-03 DIAGNOSIS — R293 Abnormal posture: Secondary | ICD-10-CM | POA: Diagnosis not present

## 2021-12-07 DIAGNOSIS — N761 Subacute and chronic vaginitis: Secondary | ICD-10-CM | POA: Diagnosis not present

## 2021-12-07 DIAGNOSIS — N898 Other specified noninflammatory disorders of vagina: Secondary | ICD-10-CM | POA: Diagnosis not present

## 2021-12-09 DIAGNOSIS — F4311 Post-traumatic stress disorder, acute: Secondary | ICD-10-CM | POA: Diagnosis not present

## 2021-12-14 DIAGNOSIS — F4311 Post-traumatic stress disorder, acute: Secondary | ICD-10-CM | POA: Diagnosis not present

## 2021-12-17 ENCOUNTER — Encounter: Payer: Self-pay | Admitting: Pulmonary Disease

## 2021-12-17 ENCOUNTER — Ambulatory Visit (INDEPENDENT_AMBULATORY_CARE_PROVIDER_SITE_OTHER): Payer: BC Managed Care – PPO | Admitting: Pulmonary Disease

## 2021-12-17 VITALS — BP 118/78 | HR 74 | Temp 98.6°F | Ht 64.0 in | Wt 167.4 lb

## 2021-12-17 DIAGNOSIS — R0683 Snoring: Secondary | ICD-10-CM | POA: Diagnosis not present

## 2021-12-17 NOTE — Patient Instructions (Signed)
Concern for sleep apnea  We will schedule you for an in lab study  Continue weight loss efforts  Call with significant concerns  Tentative follow-up in about 3 to 4 months  Sleep Apnea Sleep apnea affects breathing during sleep. It causes breathing to stop for 10 seconds or more, or to become shallow. People with sleep apnea usually snore loudly. It can also increase the risk of: Heart attack. Stroke. Being very overweight (obese). Diabetes. Heart failure. Irregular heartbeat. High blood pressure. The goal of treatment is to help you breathe normally again. What are the causes?  The most common cause of this condition is a collapsed or blocked airway. There are three kinds of sleep apnea: Obstructive sleep apnea. This is caused by a blocked or collapsed airway. Central sleep apnea. This happens when the brain does not send the right signals to the muscles that control breathing. Mixed sleep apnea. This is a combination of obstructive and central sleep apnea. What increases the risk? Being overweight. Smoking. Having a small airway. Being older. Being female. Drinking alcohol. Taking medicines to calm yourself (sedatives or tranquilizers). Having family members with the condition. Having a tongue or tonsils that are larger than normal. What are the signs or symptoms? Trouble staying asleep. Loud snoring. Headaches in the morning. Waking up gasping. Dry mouth or sore throat in the morning. Being sleepy or tired during the day. If you are sleepy or tired during the day, you may also: Not be able to focus your mind (concentrate). Forget things. Get angry a lot and have mood swings. Feel sad (depressed). Have changes in your personality. Have less interest in sex, if you are female. Be unable to have an erection, if you are female. How is this treated?  Sleeping on your side. Using a medicine to get rid of mucus in your nose (decongestant). Avoiding the use of alcohol,  medicines to help you relax, or certain pain medicines (narcotics). Losing weight, if needed. Changing your diet. Quitting smoking. Using a machine to open your airway while you sleep, such as: An oral appliance. This is a mouthpiece that shifts your lower jaw forward. A CPAP device. This device blows air through a mask when you breathe out (exhale). An EPAP device. This has valves that you put in each nostril. A BIPAP device. This device blows air through a mask when you breathe in (inhale) and breathe out. Having surgery if other treatments do not work. Follow these instructions at home: Lifestyle Make changes that your doctor recommends. Eat a healthy diet. Lose weight if needed. Avoid alcohol, medicines to help you relax, and some pain medicines. Do not smoke or use any products that contain nicotine or tobacco. If you need help quitting, ask your doctor. General instructions Take over-the-counter and prescription medicines only as told by your doctor. If you were given a machine to use while you sleep, use it only as told by your doctor. If you are having surgery, make sure to tell your doctor you have sleep apnea. You may need to bring your device with you. Keep all follow-up visits. Contact a doctor if: The machine that you were given to use during sleep bothers you or does not seem to be working. You do not get better. You get worse. Get help right away if: Your chest hurts. You have trouble breathing in enough air. You have an uncomfortable feeling in your back, arms, or stomach. You have trouble talking. One side of your body feels weak.  A part of your face is hanging down. These symptoms may be an emergency. Get help right away. Call your local emergency services (911 in the [place] Do not wait to see if the symptoms will go away. Do not drive yourself to the hospital. Summary This condition affects breathing during sleep. The most common cause is a collapsed or  blocked airway. The goal of treatment is to help you breathe normally while you sleep. This information is not intended to replace advice given to you by your health care provider. Make sure you discuss any questions you have with your health care provider. Document Revised: 08/13/2020 Document Reviewed: 12/14/2019 Elsevier Patient Education  2023 [employer].

## 2021-12-17 NOTE — Progress Notes (Signed)
Joyce Cortez    [id number]    May 10, 1980  Primary Care Physician:Fry, Tera Mater, MD  Referring Physician: Nelwyn Salisbury, MD [address] [place],  [place] [address]  Chief complaint:   Patient with snoring, witnessed apneas nonrestorative sleep  HPI:  Patient being seen for nonrestorative sleep Snoring, witnessed apneas  Has snored for many years Recently told about witnessed apneas Has been recorded with very noisy breathing mouth breathing at night  Admits to dryness of the mouth in the morning She does admit to morning headaches Does not smoke cigarettes Does vape  History of fibromyalgia  Not aware of family history  Denies choking episodes or palpitations at night   Outpatient Encounter Medications as of 12/17/2021  Medication Sig   ALPRAZolam (XANAX XR) 0.5 MG 24 hr tablet Take 1 tablet (0.5 mg total) by mouth 2 (two) times daily as needed for anxiety.   cyclobenzaprine (FLEXERIL) 10 MG tablet Take 1 tablet (10 mg total) by mouth 3 (three) times daily as needed for muscle spasms.   fexofenadine (ALLEGRA) 180 MG tablet Take 180 mg by mouth daily as needed.    ibuprofen (ADVIL,MOTRIN) 200 MG tablet Take 200 mg by mouth every 6 (six) hours as needed.   mupirocin ointment (BACTROBAN) 2 % Apply 1 Application topically 2 (two) times daily.   solifenacin (VESICARE) 10 MG tablet TAKE 1 TABLET BY MOUTH EVERY DAY   triamcinolone cream (KENALOG) 0.1 % Apply 1 application. topically 2 (two) times daily.   Turmeric 1053 MG TABS Take by mouth.   No facility-administered encounter medications on file as of 12/17/2021.    Allergies as of 12/17/2021 - Review Complete 12/17/2021  Allergen Reaction Noted   Sulfa antibiotics  08/09/2017   Codeine Nausea Only 11/29/2013   Doxycycline Nausea And Vomiting 02/03/2016   Pregabalin Other (See Comments) 06/24/2016    Past Medical History:  Diagnosis Date   ADHD (attention deficit hyperactivity disorder)     Allergy    Asthma    Fibromyalgia     Past Surgical History:  Procedure Laterality Date   WISDOM TOOTH EXTRACTION      Family History  Adopted: Yes    Social History   Socioeconomic History   Marital status: Single    Spouse name: Not on file   Number of children: 0   Years of education: Not on file   Highest education level: Not on file  Occupational History   Occupation: Event organiser  Tobacco Use   Smoking status: Former   Smokeless tobacco: Never  Substance and Sexual Activity   Alcohol use: No    Alcohol/week: 0.0 standard drinks of alcohol   Drug use: Yes    Types: Marijuana    Comment: occ   Sexual activity: Not on file  Other Topics Concern   Not on file  Social History Narrative   Not on file   Social Determinants of Health   Financial Resource Strain: Not on file  Food Insecurity: Not on file  Transportation Needs: Not on file  Physical Activity: Not on file  Stress: Not on file  Social Connections: Not on file  Intimate Partner Violence: Not on file    Review of Systems  Psychiatric/Behavioral:  Positive for sleep disturbance.     Vitals:   12/17/21 1554  BP: 118/78  Pulse: 74  Temp: 98.6 F (37 C)  SpO2: 98%   Physical Exam Constitutional:  Appearance: Normal appearance.  HENT:     Head: Normocephalic.     Mouth/Throat:     Mouth: Mucous membranes are moist.     Comments: Mallampati 2, crowded oropharynx Eyes:     Pupils: Pupils are equal, round, and reactive to light.  Cardiovascular:     Rate and Rhythm: Normal rate and regular rhythm.     Heart sounds: No murmur heard.    No friction rub.  Pulmonary:     Effort: No respiratory distress.     Breath sounds: No stridor. No wheezing or rhonchi.  Musculoskeletal:     Cervical back: No rigidity or tenderness.  Neurological:     Mental Status: She is alert.  Psychiatric:        Mood and Affect: Mood normal.       12/17/2021    4:00 PM  Results of the  Epworth flowsheet  Sitting and reading 3  Watching TV 3  Sitting, inactive in a public place (e.g. a theatre or a meeting) 0  As a passenger in a car for an hour without a break 2  Lying down to rest in the afternoon when circumstances permit 1  Sitting and talking to someone 0  Sitting quietly after a lunch without alcohol 0  In a car, while stopped for a few minutes in traffic 0  Total score 9      Data Reviewed: No previous sleep study available  Assessment:  Excessive daytime sleepiness  Nonrestrictive sleep  Witnessed apneas, significant snoring history  Probability of significant sleep disordered breathing is moderate  Pathophysiology of sleep disordered breathing discussed with the patient Treatment options discussed with the patient  Plan/Recommendations: Will schedule the patient for an in lab study  Encourage weight loss efforts  Graded exercise as tolerated  Tentative follow-up in about 3 to 4 months  Call with significant concerns   Virl Diamond MD [facility] 12/17/2021, 4:14 PM  CC: Nelwyn Salisbury, MD

## 2021-12-21 DIAGNOSIS — F4311 Post-traumatic stress disorder, acute: Secondary | ICD-10-CM | POA: Diagnosis not present

## 2021-12-28 DIAGNOSIS — F4311 Post-traumatic stress disorder, acute: Secondary | ICD-10-CM | POA: Diagnosis not present

## 2021-12-30 DIAGNOSIS — M542 Cervicalgia: Secondary | ICD-10-CM | POA: Diagnosis not present

## 2021-12-30 DIAGNOSIS — M546 Pain in thoracic spine: Secondary | ICD-10-CM | POA: Diagnosis not present

## 2021-12-30 DIAGNOSIS — R293 Abnormal posture: Secondary | ICD-10-CM | POA: Diagnosis not present

## 2021-12-31 DIAGNOSIS — R293 Abnormal posture: Secondary | ICD-10-CM | POA: Diagnosis not present

## 2021-12-31 DIAGNOSIS — M546 Pain in thoracic spine: Secondary | ICD-10-CM | POA: Diagnosis not present

## 2021-12-31 DIAGNOSIS — M542 Cervicalgia: Secondary | ICD-10-CM | POA: Diagnosis not present

## 2022-01-04 DIAGNOSIS — F4311 Post-traumatic stress disorder, acute: Secondary | ICD-10-CM | POA: Diagnosis not present

## 2022-01-05 DIAGNOSIS — Z7189 Other specified counseling: Secondary | ICD-10-CM | POA: Diagnosis not present

## 2022-01-07 DIAGNOSIS — M546 Pain in thoracic spine: Secondary | ICD-10-CM | POA: Diagnosis not present

## 2022-01-07 DIAGNOSIS — R293 Abnormal posture: Secondary | ICD-10-CM | POA: Diagnosis not present

## 2022-01-07 DIAGNOSIS — M542 Cervicalgia: Secondary | ICD-10-CM | POA: Diagnosis not present

## 2022-01-18 DIAGNOSIS — F4311 Post-traumatic stress disorder, acute: Secondary | ICD-10-CM | POA: Diagnosis not present

## 2022-01-20 ENCOUNTER — Telehealth (INDEPENDENT_AMBULATORY_CARE_PROVIDER_SITE_OTHER): Payer: BC Managed Care – PPO | Admitting: Family Medicine

## 2022-01-20 ENCOUNTER — Encounter: Payer: Self-pay | Admitting: Family Medicine

## 2022-01-20 DIAGNOSIS — R21 Rash and other nonspecific skin eruption: Secondary | ICD-10-CM | POA: Diagnosis not present

## 2022-01-20 NOTE — Progress Notes (Signed)
Subjective:    Patient ID: Joyce Cortez, female    DOB: 1980/05/25, 42 y.o.   MRN: [id number]  HPI Virtual Visit via Video Note  I connected with the patient on 01/20/22 at  3:30 PM EST by a video enabled telemedicine application and verified that I am speaking with the correct person using two identifiers.  Location patient: home Location provider:work or home office Persons participating in the virtual visit: patient, provider  I discussed the limitations of evaluation and management by telemedicine and the availability of in person appointments. The patient expressed understanding and agreed to proceed.   HPI: Here to discuss some red bumps that appeared on her neck 6 days ago. These are very itchy but they are not painful. They first appeared on the right side of her neck, but now they have spread to the left side as well and they totally encircle the neck. The rash is now spreading down to the chest. She feels fine otherwise, no fever or ST or cough. She describes the bumps as tiny individual bumps that are red and solid, not filled with fluid. She could not come in today because she is living in [place], [place].    ROS: See pertinent positives and negatives per HPI.  Past Medical History:  Diagnosis Date   ADHD (attention deficit hyperactivity disorder)    Allergy    Asthma    Fibromyalgia     Past Surgical History:  Procedure Laterality Date   WISDOM TOOTH EXTRACTION      Family History  Adopted: Yes     Current Outpatient Medications:    ALPRAZolam (XANAX XR) 0.5 MG 24 hr tablet, Take 1 tablet (0.5 mg total) by mouth 2 (two) times daily as needed for anxiety., Disp: 60 tablet, Rfl: 5   cyclobenzaprine (FLEXERIL) 10 MG tablet, Take 1 tablet (10 mg total) by mouth 3 (three) times daily as needed for muscle spasms., Disp: 90 tablet, Rfl: 5   fexofenadine (ALLEGRA) 180 MG tablet, Take 180 mg by mouth daily as needed. , Disp: , Rfl:    ibuprofen (ADVIL,MOTRIN) 200  MG tablet, Take 200 mg by mouth every 6 (six) hours as needed., Disp: , Rfl:    mupirocin ointment (BACTROBAN) 2 %, Apply 1 Application topically 2 (two) times daily., Disp: 30 g, Rfl: 2   solifenacin (VESICARE) 10 MG tablet, TAKE 1 TABLET BY MOUTH EVERY DAY, Disp: 90 tablet, Rfl: 0   triamcinolone cream (KENALOG) 0.1 %, Apply 1 application. topically 2 (two) times daily., Disp: 45 g, Rfl: 5   Turmeric 1053 MG TABS, Take by mouth., Disp: , Rfl:   EXAM:  VITALS per patient if applicable:  GENERAL: alert, oriented, appears well and in no acute distress  HEENT: atraumatic, conjunttiva clear, no obvious abnormalities on inspection of external nose and ears  NECK: normal movements of the head and neck  LUNGS: on inspection no signs of respiratory distress, breathing rate appears normal, no obvious gross SOB, gasping or wheezing  CV: no obvious cyanosis  MS: moves all visible extremities without noticeable abnormality  PSYCH/NEURO: pleasant and cooperative, no obvious depression or anxiety, speech and thought processing grossly intact  ASSESSMENT AND PLAN: She has a rash on her neck of uncertain etiology. This is not likely to be shingles because it goes all the way around the neck. Possibly this is a contact dermatitis to clothing. She will apply Triamcinolone cream to the areas TID. She will get back to use on Friday  if they have not improved.  Alysia Penna, MD  Discussed the following assessment and plan:  No diagnosis found.     I discussed the assessment and treatment plan with the patient. The patient was provided an opportunity to ask questions and all were answered. The patient agreed with the plan and demonstrated an understanding of the instructions.   The patient was advised to call back or seek an in-person evaluation if the symptoms worsen or if the condition fails to improve as anticipated.      Review of Systems     Objective:   Physical Exam         Assessment & Plan:

## 2022-01-25 ENCOUNTER — Telehealth: Payer: Self-pay | Admitting: Family Medicine

## 2022-01-25 DIAGNOSIS — F4311 Post-traumatic stress disorder, acute: Secondary | ICD-10-CM | POA: Diagnosis not present

## 2022-01-25 NOTE — Telephone Encounter (Signed)
Yes she can use it on her hands also

## 2022-01-25 NOTE — Telephone Encounter (Signed)
Spoke with patient, and message given.    Patient stated that the rash has spread significantly to her hands where she has apprehension about taking a shower, self care etc. She stated that she is afraid, she will spread it to other parts of her body or spread it to someone else.   She asked how long will this rash continue to spread?  Please advise.

## 2022-01-25 NOTE — Telephone Encounter (Signed)
Pt is calling and has a question she was seen on 01-20-2022 for rash on her neck and  rash has  spread to her hands and she would like to know if she should use same medication on her hands. Please advise

## 2022-01-26 NOTE — Telephone Encounter (Signed)
FYI Called and spoke with patient.   She is currently an established patient with Dr. Rosey Bath in Dermatology.     Patient stated that she will contact Dr. Renda Rolls office to schedule an appointment.

## 2022-01-26 NOTE — Telephone Encounter (Signed)
I am not really sure what this is now. I think she should see a Dermatologist. Has she ever seen one?

## 2022-01-29 DIAGNOSIS — L301 Dyshidrosis [pompholyx]: Secondary | ICD-10-CM | POA: Diagnosis not present

## 2022-02-01 DIAGNOSIS — F4311 Post-traumatic stress disorder, acute: Secondary | ICD-10-CM | POA: Diagnosis not present

## 2022-02-08 DIAGNOSIS — F4311 Post-traumatic stress disorder, acute: Secondary | ICD-10-CM | POA: Diagnosis not present

## 2022-02-15 DIAGNOSIS — F4311 Post-traumatic stress disorder, acute: Secondary | ICD-10-CM | POA: Diagnosis not present

## 2022-02-22 DIAGNOSIS — F4311 Post-traumatic stress disorder, acute: Secondary | ICD-10-CM | POA: Diagnosis not present

## 2022-02-24 DIAGNOSIS — M546 Pain in thoracic spine: Secondary | ICD-10-CM | POA: Diagnosis not present

## 2022-02-24 DIAGNOSIS — R293 Abnormal posture: Secondary | ICD-10-CM | POA: Diagnosis not present

## 2022-02-24 DIAGNOSIS — M542 Cervicalgia: Secondary | ICD-10-CM | POA: Diagnosis not present

## 2022-02-24 DIAGNOSIS — N946 Dysmenorrhea, unspecified: Secondary | ICD-10-CM | POA: Diagnosis not present

## 2022-02-25 DIAGNOSIS — M546 Pain in thoracic spine: Secondary | ICD-10-CM | POA: Diagnosis not present

## 2022-02-25 DIAGNOSIS — M542 Cervicalgia: Secondary | ICD-10-CM | POA: Diagnosis not present

## 2022-02-25 DIAGNOSIS — R293 Abnormal posture: Secondary | ICD-10-CM | POA: Diagnosis not present

## 2022-03-01 DIAGNOSIS — N898 Other specified noninflammatory disorders of vagina: Secondary | ICD-10-CM | POA: Diagnosis not present

## 2022-03-01 DIAGNOSIS — F4311 Post-traumatic stress disorder, acute: Secondary | ICD-10-CM | POA: Diagnosis not present

## 2022-03-08 DIAGNOSIS — F4311 Post-traumatic stress disorder, acute: Secondary | ICD-10-CM | POA: Diagnosis not present

## 2022-03-11 DIAGNOSIS — F4311 Post-traumatic stress disorder, acute: Secondary | ICD-10-CM | POA: Diagnosis not present

## 2022-03-15 DIAGNOSIS — F4311 Post-traumatic stress disorder, acute: Secondary | ICD-10-CM | POA: Diagnosis not present

## 2022-03-16 ENCOUNTER — Ambulatory Visit (HOSPITAL_BASED_OUTPATIENT_CLINIC_OR_DEPARTMENT_OTHER): Payer: BC Managed Care – PPO | Attending: Pulmonary Disease | Admitting: Pulmonary Disease

## 2022-03-22 DIAGNOSIS — F4311 Post-traumatic stress disorder, acute: Secondary | ICD-10-CM | POA: Diagnosis not present

## 2022-03-23 DIAGNOSIS — L209 Atopic dermatitis, unspecified: Secondary | ICD-10-CM | POA: Diagnosis not present

## 2022-03-29 DIAGNOSIS — F4311 Post-traumatic stress disorder, acute: Secondary | ICD-10-CM | POA: Diagnosis not present

## 2022-04-05 DIAGNOSIS — F4311 Post-traumatic stress disorder, acute: Secondary | ICD-10-CM | POA: Diagnosis not present

## 2022-04-12 ENCOUNTER — Telehealth: Payer: Self-pay | Admitting: Family Medicine

## 2022-04-12 DIAGNOSIS — F4311 Post-traumatic stress disorder, acute: Secondary | ICD-10-CM | POA: Diagnosis not present

## 2022-04-12 NOTE — Telephone Encounter (Signed)
Pt is constantly hunger and does not really want to make an appt would like md recommendation. Pt does not live in [place]

## 2022-04-13 NOTE — Telephone Encounter (Signed)
Since she has moved away, I think the best thing for her to do is find another PCP in her area

## 2022-04-13 NOTE — Telephone Encounter (Signed)
Lvm for patient to call back for message. °

## 2022-04-19 DIAGNOSIS — F4311 Post-traumatic stress disorder, acute: Secondary | ICD-10-CM | POA: Diagnosis not present

## 2022-04-26 DIAGNOSIS — F4311 Post-traumatic stress disorder, acute: Secondary | ICD-10-CM | POA: Diagnosis not present

## 2022-05-04 DIAGNOSIS — F4311 Post-traumatic stress disorder, acute: Secondary | ICD-10-CM | POA: Diagnosis not present

## 2022-05-10 DIAGNOSIS — F4311 Post-traumatic stress disorder, acute: Secondary | ICD-10-CM | POA: Diagnosis not present

## 2022-05-12 DIAGNOSIS — M6281 Muscle weakness (generalized): Secondary | ICD-10-CM | POA: Diagnosis not present

## 2022-05-12 DIAGNOSIS — M542 Cervicalgia: Secondary | ICD-10-CM | POA: Diagnosis not present

## 2022-05-12 DIAGNOSIS — R293 Abnormal posture: Secondary | ICD-10-CM | POA: Diagnosis not present

## 2022-05-12 DIAGNOSIS — M546 Pain in thoracic spine: Secondary | ICD-10-CM | POA: Diagnosis not present

## 2022-05-17 DIAGNOSIS — M5451 Vertebrogenic low back pain: Secondary | ICD-10-CM | POA: Diagnosis not present

## 2022-05-17 DIAGNOSIS — M542 Cervicalgia: Secondary | ICD-10-CM | POA: Diagnosis not present

## 2022-05-17 DIAGNOSIS — R293 Abnormal posture: Secondary | ICD-10-CM | POA: Diagnosis not present

## 2022-05-17 DIAGNOSIS — F4311 Post-traumatic stress disorder, acute: Secondary | ICD-10-CM | POA: Diagnosis not present

## 2022-05-17 DIAGNOSIS — M546 Pain in thoracic spine: Secondary | ICD-10-CM | POA: Diagnosis not present

## 2022-05-19 DIAGNOSIS — M546 Pain in thoracic spine: Secondary | ICD-10-CM | POA: Diagnosis not present

## 2022-05-19 DIAGNOSIS — M6281 Muscle weakness (generalized): Secondary | ICD-10-CM | POA: Diagnosis not present

## 2022-05-19 DIAGNOSIS — M542 Cervicalgia: Secondary | ICD-10-CM | POA: Diagnosis not present

## 2022-05-19 DIAGNOSIS — R293 Abnormal posture: Secondary | ICD-10-CM | POA: Diagnosis not present

## 2022-05-24 DIAGNOSIS — R293 Abnormal posture: Secondary | ICD-10-CM | POA: Diagnosis not present

## 2022-05-24 DIAGNOSIS — M5451 Vertebrogenic low back pain: Secondary | ICD-10-CM | POA: Diagnosis not present

## 2022-05-24 DIAGNOSIS — M542 Cervicalgia: Secondary | ICD-10-CM | POA: Diagnosis not present

## 2022-05-24 DIAGNOSIS — M546 Pain in thoracic spine: Secondary | ICD-10-CM | POA: Diagnosis not present

## 2022-05-26 DIAGNOSIS — M546 Pain in thoracic spine: Secondary | ICD-10-CM | POA: Diagnosis not present

## 2022-05-26 DIAGNOSIS — R293 Abnormal posture: Secondary | ICD-10-CM | POA: Diagnosis not present

## 2022-05-26 DIAGNOSIS — M6281 Muscle weakness (generalized): Secondary | ICD-10-CM | POA: Diagnosis not present

## 2022-05-26 DIAGNOSIS — M542 Cervicalgia: Secondary | ICD-10-CM | POA: Diagnosis not present

## 2022-05-27 DIAGNOSIS — F4311 Post-traumatic stress disorder, acute: Secondary | ICD-10-CM | POA: Diagnosis not present

## 2022-05-31 DIAGNOSIS — F4311 Post-traumatic stress disorder, acute: Secondary | ICD-10-CM | POA: Diagnosis not present

## 2022-06-02 DIAGNOSIS — R293 Abnormal posture: Secondary | ICD-10-CM | POA: Diagnosis not present

## 2022-06-02 DIAGNOSIS — M6281 Muscle weakness (generalized): Secondary | ICD-10-CM | POA: Diagnosis not present

## 2022-06-02 DIAGNOSIS — M546 Pain in thoracic spine: Secondary | ICD-10-CM | POA: Diagnosis not present

## 2022-06-02 DIAGNOSIS — M542 Cervicalgia: Secondary | ICD-10-CM | POA: Diagnosis not present

## 2022-06-07 DIAGNOSIS — R293 Abnormal posture: Secondary | ICD-10-CM | POA: Diagnosis not present

## 2022-06-07 DIAGNOSIS — M6281 Muscle weakness (generalized): Secondary | ICD-10-CM | POA: Diagnosis not present

## 2022-06-07 DIAGNOSIS — F4311 Post-traumatic stress disorder, acute: Secondary | ICD-10-CM | POA: Diagnosis not present

## 2022-06-07 DIAGNOSIS — M542 Cervicalgia: Secondary | ICD-10-CM | POA: Diagnosis not present

## 2022-06-07 DIAGNOSIS — M546 Pain in thoracic spine: Secondary | ICD-10-CM | POA: Diagnosis not present

## 2022-06-09 DIAGNOSIS — M546 Pain in thoracic spine: Secondary | ICD-10-CM | POA: Diagnosis not present

## 2022-06-09 DIAGNOSIS — M6281 Muscle weakness (generalized): Secondary | ICD-10-CM | POA: Diagnosis not present

## 2022-06-09 DIAGNOSIS — R293 Abnormal posture: Secondary | ICD-10-CM | POA: Diagnosis not present

## 2022-06-09 DIAGNOSIS — M542 Cervicalgia: Secondary | ICD-10-CM | POA: Diagnosis not present

## 2022-06-14 DIAGNOSIS — F4311 Post-traumatic stress disorder, acute: Secondary | ICD-10-CM | POA: Diagnosis not present

## 2022-06-16 DIAGNOSIS — R293 Abnormal posture: Secondary | ICD-10-CM | POA: Diagnosis not present

## 2022-06-16 DIAGNOSIS — M546 Pain in thoracic spine: Secondary | ICD-10-CM | POA: Diagnosis not present

## 2022-06-16 DIAGNOSIS — M542 Cervicalgia: Secondary | ICD-10-CM | POA: Diagnosis not present

## 2022-06-16 DIAGNOSIS — M6281 Muscle weakness (generalized): Secondary | ICD-10-CM | POA: Diagnosis not present

## 2022-06-21 DIAGNOSIS — F4311 Post-traumatic stress disorder, acute: Secondary | ICD-10-CM | POA: Diagnosis not present

## 2022-06-28 DIAGNOSIS — F4311 Post-traumatic stress disorder, acute: Secondary | ICD-10-CM | POA: Diagnosis not present

## 2022-06-28 DIAGNOSIS — R293 Abnormal posture: Secondary | ICD-10-CM | POA: Diagnosis not present

## 2022-06-28 DIAGNOSIS — M6281 Muscle weakness (generalized): Secondary | ICD-10-CM | POA: Diagnosis not present

## 2022-06-28 DIAGNOSIS — M542 Cervicalgia: Secondary | ICD-10-CM | POA: Diagnosis not present

## 2022-06-28 DIAGNOSIS — M546 Pain in thoracic spine: Secondary | ICD-10-CM | POA: Diagnosis not present

## 2022-06-30 DIAGNOSIS — M542 Cervicalgia: Secondary | ICD-10-CM | POA: Diagnosis not present

## 2022-06-30 DIAGNOSIS — R293 Abnormal posture: Secondary | ICD-10-CM | POA: Diagnosis not present

## 2022-06-30 DIAGNOSIS — M546 Pain in thoracic spine: Secondary | ICD-10-CM | POA: Diagnosis not present

## 2022-06-30 DIAGNOSIS — M6281 Muscle weakness (generalized): Secondary | ICD-10-CM | POA: Diagnosis not present

## 2022-07-05 DIAGNOSIS — F4311 Post-traumatic stress disorder, acute: Secondary | ICD-10-CM | POA: Diagnosis not present

## 2022-07-07 DIAGNOSIS — M542 Cervicalgia: Secondary | ICD-10-CM | POA: Diagnosis not present

## 2022-07-07 DIAGNOSIS — R293 Abnormal posture: Secondary | ICD-10-CM | POA: Diagnosis not present

## 2022-07-07 DIAGNOSIS — M546 Pain in thoracic spine: Secondary | ICD-10-CM | POA: Diagnosis not present

## 2022-07-07 DIAGNOSIS — M6281 Muscle weakness (generalized): Secondary | ICD-10-CM | POA: Diagnosis not present

## 2022-07-12 DIAGNOSIS — F4311 Post-traumatic stress disorder, acute: Secondary | ICD-10-CM | POA: Diagnosis not present

## 2022-07-14 DIAGNOSIS — M6281 Muscle weakness (generalized): Secondary | ICD-10-CM | POA: Diagnosis not present

## 2022-07-14 DIAGNOSIS — M542 Cervicalgia: Secondary | ICD-10-CM | POA: Diagnosis not present

## 2022-07-14 DIAGNOSIS — R293 Abnormal posture: Secondary | ICD-10-CM | POA: Diagnosis not present

## 2022-07-14 DIAGNOSIS — M546 Pain in thoracic spine: Secondary | ICD-10-CM | POA: Diagnosis not present

## 2022-07-19 DIAGNOSIS — F4311 Post-traumatic stress disorder, acute: Secondary | ICD-10-CM | POA: Diagnosis not present

## 2022-07-21 DIAGNOSIS — F4311 Post-traumatic stress disorder, acute: Secondary | ICD-10-CM | POA: Diagnosis not present

## 2022-07-26 DIAGNOSIS — F4311 Post-traumatic stress disorder, acute: Secondary | ICD-10-CM | POA: Diagnosis not present

## 2022-08-02 DIAGNOSIS — M6281 Muscle weakness (generalized): Secondary | ICD-10-CM | POA: Diagnosis not present

## 2022-08-02 DIAGNOSIS — R293 Abnormal posture: Secondary | ICD-10-CM | POA: Diagnosis not present

## 2022-08-02 DIAGNOSIS — M542 Cervicalgia: Secondary | ICD-10-CM | POA: Diagnosis not present

## 2022-08-02 DIAGNOSIS — M546 Pain in thoracic spine: Secondary | ICD-10-CM | POA: Diagnosis not present

## 2022-08-02 DIAGNOSIS — F4311 Post-traumatic stress disorder, acute: Secondary | ICD-10-CM | POA: Diagnosis not present

## 2022-08-09 DIAGNOSIS — F4311 Post-traumatic stress disorder, acute: Secondary | ICD-10-CM | POA: Diagnosis not present

## 2022-08-11 ENCOUNTER — Ambulatory Visit: Payer: BC Managed Care – PPO | Admitting: Family Medicine

## 2022-08-11 ENCOUNTER — Encounter: Payer: Self-pay | Admitting: Family Medicine

## 2022-08-11 VITALS — BP 110/72 | HR 80 | Temp 98.2°F | Wt 173.2 lb

## 2022-08-11 DIAGNOSIS — N760 Acute vaginitis: Secondary | ICD-10-CM | POA: Diagnosis not present

## 2022-08-11 DIAGNOSIS — F900 Attention-deficit hyperactivity disorder, predominantly inattentive type: Secondary | ICD-10-CM

## 2022-08-11 DIAGNOSIS — R300 Dysuria: Secondary | ICD-10-CM | POA: Diagnosis not present

## 2022-08-11 MED ORDER — AMPHETAMINE-DEXTROAMPHETAMINE 20 MG PO TABS: 20.0000 mg | ORAL_TABLET | Freq: Two times a day (BID) | ORAL | 0 refills | Status: DC

## 2022-08-11 NOTE — Progress Notes (Signed)
   Subjective:    Patient ID: Joyce Cortez, female    DOB: [birth year], 42 y.o.   MRN: [id number]  HPI Here asking to start back on Adderall. 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 She knows that this will pose a challenge with her ADHD unless she gets some help. Otherwise she feels well.    Review of Systems  Constitutional: Negative.   Respiratory: Negative.    Cardiovascular: Negative.   Psychiatric/Behavioral:  Positive for decreased concentration. Negative for dysphoric mood. The patient is not nervous/anxious.        Objective:   Physical Exam Constitutional:      Appearance: Normal appearance.  Cardiovascular:     Rate and Rhythm: Normal rate and regular rhythm.     Pulses: Normal pulses.     Heart sounds: Normal heart sounds.  Pulmonary:     Effort: Pulmonary effort is normal.     Breath sounds: Normal breath sounds.  Neurological:     Mental Status: She is alert and oriented to person, place, and time. Mental status is at baseline.           Assessment & Plan:  ADHD, she will start back on Adderall 20 mg BID as needed.  Gershon Crane, MD

## 2022-08-12 DIAGNOSIS — M542 Cervicalgia: Secondary | ICD-10-CM | POA: Diagnosis not present

## 2022-08-12 DIAGNOSIS — R293 Abnormal posture: Secondary | ICD-10-CM | POA: Diagnosis not present

## 2022-08-12 DIAGNOSIS — M546 Pain in thoracic spine: Secondary | ICD-10-CM | POA: Diagnosis not present

## 2022-08-12 DIAGNOSIS — M5451 Vertebrogenic low back pain: Secondary | ICD-10-CM | POA: Diagnosis not present

## 2022-08-13 DIAGNOSIS — M5451 Vertebrogenic low back pain: Secondary | ICD-10-CM | POA: Diagnosis not present

## 2022-08-13 DIAGNOSIS — M542 Cervicalgia: Secondary | ICD-10-CM | POA: Diagnosis not present

## 2022-08-13 DIAGNOSIS — M546 Pain in thoracic spine: Secondary | ICD-10-CM | POA: Diagnosis not present

## 2022-08-13 DIAGNOSIS — R293 Abnormal posture: Secondary | ICD-10-CM | POA: Diagnosis not present

## 2022-08-16 DIAGNOSIS — F4311 Post-traumatic stress disorder, acute: Secondary | ICD-10-CM | POA: Diagnosis not present

## 2022-08-18 DIAGNOSIS — M5451 Vertebrogenic low back pain: Secondary | ICD-10-CM | POA: Diagnosis not present

## 2022-08-18 DIAGNOSIS — M546 Pain in thoracic spine: Secondary | ICD-10-CM | POA: Diagnosis not present

## 2022-08-18 DIAGNOSIS — R293 Abnormal posture: Secondary | ICD-10-CM | POA: Diagnosis not present

## 2022-08-18 DIAGNOSIS — M542 Cervicalgia: Secondary | ICD-10-CM | POA: Diagnosis not present

## 2022-08-23 DIAGNOSIS — F4311 Post-traumatic stress disorder, acute: Secondary | ICD-10-CM | POA: Diagnosis not present

## 2022-08-30 DIAGNOSIS — F4311 Post-traumatic stress disorder, acute: Secondary | ICD-10-CM | POA: Diagnosis not present

## 2022-09-03 ENCOUNTER — Encounter: Payer: Self-pay | Admitting: Family Medicine

## 2022-09-03 ENCOUNTER — Telehealth: Payer: BC Managed Care – PPO | Admitting: Family Medicine

## 2022-09-03 DIAGNOSIS — U071 COVID-19: Secondary | ICD-10-CM

## 2022-09-03 MED ORDER — NIRMATRELVIR/RITONAVIR (PAXLOVID) TABLET (RENAL DOSING): 2.0000 | ORAL_TABLET | Freq: Two times a day (BID) | ORAL | 0 refills | Status: AC

## 2022-09-03 NOTE — Progress Notes (Signed)
Subjective:    Patient ID: Joyce Cortez, female    DOB: [birth year], 42 y.o.   MRN: [id number]  HPI Virtual Visit via Video Note  I connected with the patient on 09/03/22 at  8:45 AM EDT by a video enabled telemedicine application and verified that I am speaking with the correct person using two identifiers.  Location patient: home Location provider:work or home office Persons participating in the virtual visit: patient, provider  I discussed the limitations of evaluation and management by telemedicine and the availability of in person appointments. The patient expressed understanding and agreed to proceed.   HPI: Here for 3 days of fever, body aches, diarrhea, and a dry cough. No SOB. Taking Ibuprofen. She tested positive for Covid last night.    ROS: See pertinent positives and negatives per HPI.  Past Medical History:  Diagnosis Date   ADHD (attention deficit hyperactivity disorder)    Allergy    Asthma    Fibromyalgia     Past Surgical History:  Procedure Laterality Date   WISDOM TOOTH EXTRACTION      Family History  Adopted: Yes     Current Outpatient Medications:    amphetamine-dextroamphetamine (ADDERALL) 20 MG tablet, Take 1 tablet (20 mg total) by mouth 2 (two) times daily., Disp: 60 tablet, Rfl: 0   amphetamine-dextroamphetamine (ADDERALL) 20 MG tablet, Take 1 tablet (20 mg total) by mouth 2 (two) times daily., Disp: 60 tablet, Rfl: 0   amphetamine-dextroamphetamine (ADDERALL) 20 MG tablet, Take 1 tablet (20 mg total) by mouth 2 (two) times daily., Disp: 60 tablet, Rfl: 0   fexofenadine (ALLEGRA) 180 MG tablet, Take 180 mg by mouth daily as needed. , Disp: , Rfl:    ibuprofen (ADVIL,MOTRIN) 200 MG tablet, Take 200 mg by mouth every 6 (six) hours as needed., Disp: , Rfl:    mupirocin ointment (BACTROBAN) 2 %, Apply 1 Application topically 2 (two) times daily., Disp: 30 g, Rfl: 2   solifenacin (VESICARE) 10 MG tablet, TAKE 1 TABLET BY MOUTH EVERY DAY, Disp:  90 tablet, Rfl: 0   triamcinolone cream (KENALOG) 0.1 %, Apply 1 application. topically 2 (two) times daily., Disp: 45 g, Rfl: 5   Turmeric 1053 MG TABS, Take by mouth., Disp: , Rfl:   EXAM:  VITALS per patient if applicable:  GENERAL: alert, oriented, appears well and in no acute distress  HEENT: atraumatic, conjunttiva clear, no obvious abnormalities on inspection of external nose and ears  NECK: normal movements of the head and neck  LUNGS: on inspection no signs of respiratory distress, breathing rate appears normal, no obvious gross SOB, gasping or wheezing  CV: no obvious cyanosis  MS: moves all visible extremities without noticeable abnormality  PSYCH/NEURO: pleasant and cooperative, no obvious depression or anxiety, speech and thought processing grossly intact  ASSESSMENT AND PLAN: Covid infection. Treat with 5 days of Paxlovid. Gershon Crane, MD  Discussed the following assessment and plan:  No diagnosis found.     I discussed the assessment and treatment plan with the patient. The patient was provided an opportunity to ask questions and all were answered. The patient agreed with the plan and demonstrated an understanding of the instructions.   The patient was advised to call back or seek an in-person evaluation if the symptoms worsen or if the condition fails to improve as anticipated.      Review of Systems     Objective:   Physical Exam        Assessment &  Plan:

## 2022-09-06 DIAGNOSIS — F4311 Post-traumatic stress disorder, acute: Secondary | ICD-10-CM | POA: Diagnosis not present

## 2022-09-07 ENCOUNTER — Telehealth: Payer: Self-pay | Admitting: Family Medicine

## 2022-09-07 NOTE — Telephone Encounter (Signed)
Was treated for covid 09/03/22, still not feeling great, requesting another rx nirmatrelvir/ritonavir, renal dosing, (PAXLOVID) 10 x 150 MG & 10 x 100MG  TABS

## 2022-09-07 NOTE — Telephone Encounter (Signed)
Spoke with pt stated that she is still not feeling well after COVID. Pt advised that Dr Clent Ridges was not available for appointment. Requested to see another provider in the office. Pt scheduled with Dr Kern Alberta of 09/08/22 at 5 pm

## 2022-09-07 NOTE — Telephone Encounter (Signed)
No you can only take one course of this at a time

## 2022-09-08 ENCOUNTER — Telehealth (INDEPENDENT_AMBULATORY_CARE_PROVIDER_SITE_OTHER): Payer: BC Managed Care – PPO | Admitting: Family Medicine

## 2022-09-08 ENCOUNTER — Encounter: Payer: Self-pay | Admitting: Family Medicine

## 2022-09-08 VITALS — Ht 64.0 in | Wt 173.2 lb

## 2022-09-08 DIAGNOSIS — U071 COVID-19: Secondary | ICD-10-CM

## 2022-09-08 NOTE — Progress Notes (Signed)
Patient ID: Joyce Cortez, female   DOB: Apr 07, 1980, 42 y.o.   MRN: [id number]   Virtual Visit via Video Note  I connected with Joyce Cortez on 09/08/22 at  5:00 PM EDT by a video enabled telemedicine application and verified that I am speaking with the correct person using two identifiers.  Location patient: home Location provider:work or home office Persons participating in the virtual visit: patient, provider  I discussed the limitations of evaluation and management by telemedicine and the availability of in person appointments. The patient expressed understanding and agreed to proceed.   HPI: Joyce Cortez had onset on the 15th of this month with nasal congestion, cough, chills, and some mild diarrhea.  She tested positive for COVID.  She had virtual visit on the 16th and was started on Paxlovid.  However, the first day she was mixed up on directions and did not take full dose of Paxlovid.  She has been taking this appropriately since then.  She does feel some better but still has some increased fatigue.  Still has some cough.  Diarrhea has improved.  No nausea or vomiting.  No dyspnea.  She has questions regarding going back to work.   ROS: See pertinent positives and negatives per HPI.  Past Medical History:  Diagnosis Date   ADHD (attention deficit hyperactivity disorder)    Allergy    Asthma    Fibromyalgia     Past Surgical History:  Procedure Laterality Date   WISDOM TOOTH EXTRACTION      Family History  Adopted: Yes    SOCIAL HX: non-Smoker   Current Outpatient Medications:    amphetamine-dextroamphetamine (ADDERALL) 20 MG tablet, Take 1 tablet (20 mg total) by mouth 2 (two) times daily., Disp: 60 tablet, Rfl: 0   amphetamine-dextroamphetamine (ADDERALL) 20 MG tablet, Take 1 tablet (20 mg total) by mouth 2 (two) times daily., Disp: 60 tablet, Rfl: 0   amphetamine-dextroamphetamine (ADDERALL) 20 MG tablet, Take 1 tablet (20 mg total) by mouth 2 (two) times daily.,  Disp: 60 tablet, Rfl: 0   fexofenadine (ALLEGRA) 180 MG tablet, Take 180 mg by mouth daily as needed. , Disp: , Rfl:    ibuprofen (ADVIL,MOTRIN) 200 MG tablet, Take 200 mg by mouth every 6 (six) hours as needed., Disp: , Rfl:    mupirocin ointment (BACTROBAN) 2 %, Apply 1 Application topically 2 (two) times daily., Disp: 30 g, Rfl: 2   nirmatrelvir/ritonavir, renal dosing, (PAXLOVID) 10 x 150 MG & 10 x 100MG  TABS, Take 2 tablets by mouth 2 (two) times daily for 5 days. (Take nirmatrelvir 150 mg one tablet twice daily for 5 days and ritonavir 100 mg one tablet twice daily for 5 days) Patient GFR is ?, Disp: 20 tablet, Rfl: 0   solifenacin (VESICARE) 10 MG tablet, TAKE 1 TABLET BY MOUTH EVERY DAY, Disp: 90 tablet, Rfl: 0   triamcinolone cream (KENALOG) 0.1 %, Apply 1 application. topically 2 (two) times daily., Disp: 45 g, Rfl: 5   Turmeric 1053 MG TABS, Take by mouth., Disp: , Rfl:   EXAM:  VITALS per patient if applicable:  GENERAL: alert, oriented, appears well and in no acute distress  HEENT: atraumatic, conjunttiva clear, no obvious abnormalities on inspection of external nose and ears  NECK: normal movements of the head and neck  LUNGS: on inspection no signs of respiratory distress, breathing rate appears normal, no obvious gross SOB, gasping or wheezing  CV: no obvious cyanosis  MS: moves all visible extremities without noticeable abnormality  PSYCH/NEURO: pleasant and cooperative, no obvious depression or anxiety, speech and thought processing grossly intact  ASSESSMENT AND PLAN:  Discussed the following assessment and plan:  COVID-19 infection.  Patient slowly improving symptomatically.  Still has significant fatigue.  She is finishing up Paxlovid at this time. -She has concerns regarding going back to work yet because of fatigue.  She will stay out remainder this week but try to return Monday. -Follow-up immediately for any fever, dyspnea, or other concerns -She still has  some mucus in her chest and we recommended over-the-counter Mucinex for that.     I discussed the assessment and treatment plan with the patient. The patient was provided an opportunity to ask questions and all were answered. The patient agreed with the plan and demonstrated an understanding of the instructions.   The patient was advised to call back or seek an in-person evaluation if the symptoms worsen or if the condition fails to improve as anticipated.     Evelena Peat, MD

## 2022-09-13 DIAGNOSIS — F4311 Post-traumatic stress disorder, acute: Secondary | ICD-10-CM | POA: Diagnosis not present

## 2022-09-20 DIAGNOSIS — F4311 Post-traumatic stress disorder, acute: Secondary | ICD-10-CM | POA: Diagnosis not present

## 2022-09-27 DIAGNOSIS — F4311 Post-traumatic stress disorder, acute: Secondary | ICD-10-CM | POA: Diagnosis not present

## 2022-10-04 DIAGNOSIS — F4311 Post-traumatic stress disorder, acute: Secondary | ICD-10-CM | POA: Diagnosis not present

## 2022-10-11 DIAGNOSIS — F4311 Post-traumatic stress disorder, acute: Secondary | ICD-10-CM | POA: Diagnosis not present

## 2022-10-12 DIAGNOSIS — N951 Menopausal and female climacteric states: Secondary | ICD-10-CM | POA: Diagnosis not present

## 2022-10-18 DIAGNOSIS — F4311 Post-traumatic stress disorder, acute: Secondary | ICD-10-CM | POA: Diagnosis not present

## 2022-10-25 DIAGNOSIS — F4311 Post-traumatic stress disorder, acute: Secondary | ICD-10-CM | POA: Diagnosis not present

## 2022-11-01 DIAGNOSIS — F4311 Post-traumatic stress disorder, acute: Secondary | ICD-10-CM | POA: Diagnosis not present

## 2022-11-08 DIAGNOSIS — F4311 Post-traumatic stress disorder, acute: Secondary | ICD-10-CM | POA: Diagnosis not present

## 2022-11-15 DIAGNOSIS — F4311 Post-traumatic stress disorder, acute: Secondary | ICD-10-CM | POA: Diagnosis not present

## 2022-11-22 DIAGNOSIS — F4311 Post-traumatic stress disorder, acute: Secondary | ICD-10-CM | POA: Diagnosis not present

## 2022-11-26 DIAGNOSIS — F4311 Post-traumatic stress disorder, acute: Secondary | ICD-10-CM | POA: Diagnosis not present

## 2022-11-29 DIAGNOSIS — F4311 Post-traumatic stress disorder, acute: Secondary | ICD-10-CM | POA: Diagnosis not present

## 2022-12-06 DIAGNOSIS — F4311 Post-traumatic stress disorder, acute: Secondary | ICD-10-CM | POA: Diagnosis not present

## 2022-12-13 DIAGNOSIS — F4311 Post-traumatic stress disorder, acute: Secondary | ICD-10-CM | POA: Diagnosis not present

## 2022-12-20 DIAGNOSIS — F4311 Post-traumatic stress disorder, acute: Secondary | ICD-10-CM | POA: Diagnosis not present

## 2022-12-27 DIAGNOSIS — F4311 Post-traumatic stress disorder, acute: Secondary | ICD-10-CM | POA: Diagnosis not present

## 2023-01-03 DIAGNOSIS — F4311 Post-traumatic stress disorder, acute: Secondary | ICD-10-CM | POA: Diagnosis not present

## 2023-01-07 DIAGNOSIS — F4311 Post-traumatic stress disorder, acute: Secondary | ICD-10-CM | POA: Diagnosis not present

## 2023-01-17 DIAGNOSIS — F4311 Post-traumatic stress disorder, acute: Secondary | ICD-10-CM | POA: Diagnosis not present

## 2023-01-24 DIAGNOSIS — F4311 Post-traumatic stress disorder, acute: Secondary | ICD-10-CM | POA: Diagnosis not present

## 2023-01-31 DIAGNOSIS — F4311 Post-traumatic stress disorder, acute: Secondary | ICD-10-CM | POA: Diagnosis not present

## 2023-02-07 DIAGNOSIS — F4311 Post-traumatic stress disorder, acute: Secondary | ICD-10-CM | POA: Diagnosis not present

## 2023-02-21 DIAGNOSIS — F4311 Post-traumatic stress disorder, acute: Secondary | ICD-10-CM | POA: Diagnosis not present

## 2023-02-28 DIAGNOSIS — F4311 Post-traumatic stress disorder, acute: Secondary | ICD-10-CM | POA: Diagnosis not present

## 2023-03-07 DIAGNOSIS — F4311 Post-traumatic stress disorder, acute: Secondary | ICD-10-CM | POA: Diagnosis not present

## 2023-03-14 ENCOUNTER — Ambulatory Visit: Payer: Self-pay | Admitting: Family Medicine

## 2023-03-14 DIAGNOSIS — F4311 Post-traumatic stress disorder, acute: Secondary | ICD-10-CM | POA: Diagnosis not present

## 2023-03-14 NOTE — Telephone Encounter (Signed)
  Chief Complaint: bilateral leg swelling , arms swelling at times. Face swelling at times. Pain worsening in legs Symptoms: left leg worse than right , swelling up to knees. Swelling noted in arms at times and face in mornings upon awakening. Uses massage to decrease swelling. Pain worsening in legs.  Frequency: on going greater than a year Pertinent Negatives: Patient denies chest pain no difficulty breathing no fever Disposition: [] ED /[] Urgent Care (no appt availability in office) / [x] Appointment(In office/virtual)/ []  [facility]/ [] Home Care/ [] Refused Recommended Disposition /[]  Mobile Bus/ []  Follow-up with PCP Additional Notes:   Appt scheduled tomorrow. Recommended if sx worsen go to ED.      Copied from CRM [id number]. Topic: Clinical - Red Word Triage >> Mar 14, 2023  2:04 PM Irine Seal wrote: Red Word that prompted transfer to Nurse Triage: patient is wanting acute appt, she stated she thinks she has edema in her legs, pain and has to massage them frequently, symptoms present for a few years but have been getting worse the last few weeks. Patient stated she is not sure if its lymphoedema or what but after massaging she will lose 2-3 inches form the swelling Reason for Disposition  [1] MODERATE leg swelling (e.g., swelling extends up to knees) AND [2] new-onset or worsening  Answer Assessment - Initial Assessment Questions 1. ONSET: "When did the swelling start?" (e.g., minutes, hours, days)     On going for years  2. LOCATION: "What part of the leg is swollen?"  "Are both legs swollen or just one leg?"     Both legs and left leg more swelling  3. SEVERITY: "How bad is the swelling?" (e.g., localized; mild, moderate, severe)   - Localized: Small area of swelling localized to one leg.   - MILD pedal edema: Swelling limited to foot and ankle, pitting edema < 1/4 inch (6 mm) deep, rest and elevation eliminate most or all swelling.   - MODERATE edema: Swelling of  lower leg to knee, pitting edema > 1/4 inch (6 mm) deep, rest and elevation only partially reduce swelling.   - SEVERE edema: Swelling extends above knee, facial or hand swelling present.      Wears orthopedic "slides" 4. REDNESS: "Does the swelling look red or infected?"     Yes , "hard as a rock " after walking last night 5. PAIN: "Is the swelling painful to touch?" If Yes, ask: "How painful is it?"   (Scale 1-10; mild, moderate or severe)     Pain worse with touching.  6. FEVER: "Do you have a fever?" If Yes, ask: "What is it, how was it measured, and when did it start?"      Na  7. CAUSE: "What do you think is causing the leg swelling?"     lyphomdema 8. MEDICAL HISTORY: "Do you have a history of blood clots (e.g., DVT), cancer, heart failure, kidney disease, or liver failure?"     Na  9. RECURRENT SYMPTOM: "Have you had leg swelling before?" If Yes, ask: "When was the last time?" "What happened that time?"     Yes  10. OTHER SYMPTOMS: "Do you have any other symptoms?" (e.g., chest pain, difficulty breathing)       Bilateral leg swelling up to knees. Only goes away with massages.  11. PREGNANCY: "Is there any chance you are pregnant?" "When was your last menstrual period?"       na  Protocols used: Leg Swelling and Edema-A-AH

## 2023-03-15 ENCOUNTER — Ambulatory Visit (INDEPENDENT_AMBULATORY_CARE_PROVIDER_SITE_OTHER): Payer: BC Managed Care – PPO | Admitting: Family Medicine

## 2023-03-15 VITALS — BP 124/72 | HR 107 | Temp 98.0°F | Ht 64.0 in | Wt 176.5 lb

## 2023-03-15 DIAGNOSIS — E876 Hypokalemia: Secondary | ICD-10-CM | POA: Diagnosis not present

## 2023-03-15 DIAGNOSIS — R5383 Other fatigue: Secondary | ICD-10-CM

## 2023-03-15 DIAGNOSIS — R600 Localized edema: Secondary | ICD-10-CM

## 2023-03-15 LAB — COMPREHENSIVE METABOLIC PANEL
ALT: 15 U/L (ref 0–35)
AST: 17 U/L (ref 0–37)
Albumin: 4.6 g/dL (ref 3.5–5.2)
Alkaline Phosphatase: 56 U/L (ref 39–117)
BUN: 10 mg/dL (ref 6–23)
CO2: 28 meq/L (ref 19–32)
Calcium: 9.4 mg/dL (ref 8.4–10.5)
Chloride: 101 meq/L (ref 96–112)
Creatinine, Ser: 0.85 mg/dL (ref 0.40–1.20)
GFR: 85.66 mL/min (ref >60.00)
Glucose, Bld: 92 mg/dL (ref 70–99)
Potassium: 3.3 meq/L — ABNORMAL LOW (ref 3.5–5.1)
Sodium: 137 meq/L (ref 135–145)
Total Bilirubin: 0.7 mg/dL (ref 0.2–1.2)
Total Protein: 7.4 g/dL (ref 6.0–8.3)

## 2023-03-15 LAB — CBC WITH DIFFERENTIAL/PLATELET
Basophils Absolute: 0 10*3/uL (ref 0.0–0.1)
Basophils Relative: 0.5 % (ref 0.0–3.0)
Eosinophils Absolute: 0 10*3/uL (ref 0.0–0.7)
Eosinophils Relative: 0.6 % (ref 0.0–5.0)
HCT: 41.3 % (ref 36.0–46.0)
Hemoglobin: 13.9 g/dL (ref 12.0–15.0)
Lymphocytes Relative: 22 % (ref 12.0–46.0)
Lymphs Abs: 1.6 10*3/uL (ref 0.7–4.0)
MCHC: 33.6 g/dL (ref 30.0–36.0)
MCV: 94 fL (ref 78.0–100.0)
Monocytes Absolute: 0.4 10*3/uL (ref 0.1–1.0)
Monocytes Relative: 6 % (ref 3.0–12.0)
Neutro Abs: 5.3 10*3/uL (ref 1.4–7.7)
Neutrophils Relative %: 70.9 % (ref 43.0–77.0)
Platelets: 230 10*3/uL (ref 150.0–400.0)
RBC: 4.39 Mil/uL (ref 3.87–5.11)
RDW: 13.2 % (ref 11.5–15.5)
WBC: 7.4 10*3/uL (ref 4.0–10.5)

## 2023-03-15 LAB — MICROALBUMIN / CREATININE URINE RATIO
Creatinine,U: 140.5 mg/dL
Microalb Creat Ratio: 5.6 mg/g (ref 0.0–30.0)
Microalb, Ur: 0.8 mg/dL (ref 0.0–1.9)

## 2023-03-15 LAB — TSH: TSH: 1.18 u[IU]/mL (ref 0.35–5.50)

## 2023-03-15 NOTE — Progress Notes (Signed)
 Established Patient Office Visit  Subjective   Patient ID: Joyce Cortez, female    DOB: 06/07/1980  Age: 43 y.o. MRN: [id number]  Chief Complaint  Patient presents with   Edema    Pt c/o swelling of feet and arms, face. Sx been going on for year. Pt reports she just got done 2 hrs whole body massage today.    Skin check    Pt c/o redness on L thigh. Would like provider to take a look at it.     HPI   Patient seen as a work in with complaints of some edema in her legs somewhat intermittently.  She states that symptoms been going on "for years.".  She feels like muscle massage does help.  She states that she was walking couple days ago and noticed some splotchy red areas left thigh afterwards.  Erythema has faded since then.  She did not describe this is urticaria.  Nonpruritic.  Denies any lower extremity cramping.  She states that she has some bilateral lower extremity edema intermittently but no associated dyspnea or orthopnea.  Does not take any regular medications.  She does have reported history of fibromyalgia, GERD, ADD. She does relate that her skin frequently feels "cold "-especially backs of arms.  May have had some recent mild weight loss.  No constipation reported.  Weight stable.  She is trying to lose some weight and is recently started walking program.  Does have some fatigue.  She is adopted so family history unknown  Past Medical History:  Diagnosis Date   ADHD (attention deficit hyperactivity disorder)    Allergy    Asthma    Fibromyalgia    Past Surgical History:  Procedure Laterality Date   WISDOM TOOTH EXTRACTION      reports that she has quit smoking. She has never used smokeless tobacco. She reports current drug use. Drug: Marijuana. She reports that she does not drink alcohol. family history is not on file. She was adopted. Allergies  Allergen Reactions   Sulfa Antibiotics     RASH, FEVER AND HEADACHE    Codeine Nausea Only   Doxycycline Nausea And  Vomiting   Pregabalin Other (See Comments)    Review of Systems  Constitutional:  Positive for malaise/fatigue. Negative for chills, fever and weight loss.  Respiratory:  Negative for cough and shortness of breath.   Cardiovascular:  Negative for chest pain.  Genitourinary:  Negative for dysuria.      Objective:     BP 124/72 (BP Location: Left Arm, Patient Position: Sitting, Cuff Size: Large)   Pulse (!) 107   Temp 98 F (36.7 C) (Oral)   Ht 5\' 4"  (1.626 m)   Wt 176 lb 8 oz (80.1 kg)   LMP 02/25/2023 (Exact Date)   SpO2 97%   BMI 30.30 kg/m  BP Readings from Last 3 Encounters:  03/15/23 124/72  08/11/22 110/72  12/17/21 118/78   Wt Readings from Last 3 Encounters:  03/15/23 176 lb 8 oz (80.1 kg)  09/08/22 173 lb 3.2 oz (78.6 kg)  08/11/22 173 lb 3.2 oz (78.6 kg)      Physical Exam Vitals reviewed.  Constitutional:      General: She is not in acute distress.    Appearance: She is not ill-appearing.  Cardiovascular:     Rate and Rhythm: Normal rate and regular rhythm.     Heart sounds: No murmur heard. Pulmonary:     Effort: Pulmonary effort is normal.  Breath sounds: Normal breath sounds. No wheezing or rales.  Musculoskeletal:     Comments: No significant edema noted at this time involving feet, ankles, or lower legs.  Skin:    Findings: No rash.  Neurological:     Mental Status: She is alert.      Results for orders placed or performed in visit on 03/15/23  Microalbumin / creatinine urine ratio  Result Value Ref Range   Microalb, Ur 0.8 0.0 - 1.9 mg/dL   Creatinine,U 161.0 mg/dL   Microalb Creat Ratio 5.6 0.0 - 30.0 mg/g  TSH  Result Value Ref Range   TSH 1.18 0.35 - 5.50 uIU/mL  CBC with Differential/Platelet  Result Value Ref Range   WBC 7.4 4.0 - 10.5 K/uL   RBC 4.39 3.87 - 5.11 Mil/uL   Hemoglobin 13.9 12.0 - 15.0 g/dL   HCT 96.0 45.4 - 09.8 %   MCV 94.0 78.0 - 100.0 fl   MCHC 33.6 30.0 - 36.0 g/dL   RDW 11.9 14.7 - 82.9 %    Platelets 230.0 150.0 - 400.0 K/uL   Neutrophils Relative % 70.9 43.0 - 77.0 %   Lymphocytes Relative 22.0 12.0 - 46.0 %   Monocytes Relative 6.0 3.0 - 12.0 %   Eosinophils Relative 0.6 0.0 - 5.0 %   Basophils Relative 0.5 0.0 - 3.0 %   Neutro Abs 5.3 1.4 - 7.7 K/uL   Lymphs Abs 1.6 0.7 - 4.0 K/uL   Monocytes Absolute 0.4 0.1 - 1.0 K/uL   Eosinophils Absolute 0.0 0.0 - 0.7 K/uL   Basophils Absolute 0.0 0.0 - 0.1 K/uL  CMP  Result Value Ref Range   Sodium 137 135 - 145 mEq/L   Potassium 3.3 (L) 3.5 - 5.1 mEq/L   Chloride 101 96 - 112 mEq/L   CO2 28 19 - 32 mEq/L   Glucose, Bld 92 70 - 99 mg/dL   BUN 10 6 - 23 mg/dL   Creatinine, Ser 5.62 0.40 - 1.20 mg/dL   Total Bilirubin 0.7 0.2 - 1.2 mg/dL   Alkaline Phosphatase 56 39 - 117 U/L   AST 17 0 - 37 U/L   ALT 15 0 - 35 U/L   Total Protein 7.4 6.0 - 8.3 g/dL   Albumin 4.6 3.5 - 5.2 g/dL   GFR 13.08 >65.78 mL/min   Calcium 9.4 8.4 - 10.5 mg/dL      The ASCVD Risk score (Arnett DK, et al., 2019) failed to calculate for the following reasons:   Cannot find a previous HDL lab   Cannot find a previous total cholesterol lab    Assessment & Plan:   Problem List Items Addressed This Visit   None Visit Diagnoses       Fatigue, unspecified type    -  Primary   Relevant Orders   CMP (Completed)   CBC with Differential/Platelet (Completed)   TSH (Completed)     Bilateral leg edema       Relevant Orders   CMP (Completed)   CBC with Differential/Platelet (Completed)   Microalbumin / creatinine urine ratio (Completed)     Patient is reporting bilateral intermittent lower extremity edema for years.  No obvious edema noted at this time on exam.  She is also relating several nonspecific symptoms including some fatigue, intermittent coolness to touch of skin, and possibly some mild diffuse hair loss.  -Check labs including TSH, CBC, CMP, and urine microalbumin screen but doubt renal etiology.  No follow-ups on file.  Evelena Peat, MD

## 2023-03-16 ENCOUNTER — Telehealth: Payer: Self-pay

## 2023-03-16 NOTE — Addendum Note (Signed)
 Addended by: Christy Sartorius on: 03/16/2023 01:27 PM   Modules accepted: Orders

## 2023-03-16 NOTE — Telephone Encounter (Signed)
 Noted.

## 2023-03-16 NOTE — Telephone Encounter (Signed)
 Please see result note

## 2023-03-16 NOTE — Telephone Encounter (Signed)
 Copied from CRM [id number]. Topic: Clinical - Lab/Test Results >> Mar 16, 2023 12:25 PM Adaysia C wrote: Reason for CRM: Patient returning a missed call from Kathlene November to discuss her lab results, please follow up with patient [phone]

## 2023-03-21 DIAGNOSIS — F4311 Post-traumatic stress disorder, acute: Secondary | ICD-10-CM | POA: Diagnosis not present

## 2023-03-23 DIAGNOSIS — M6281 Muscle weakness (generalized): Secondary | ICD-10-CM | POA: Diagnosis not present

## 2023-03-28 DIAGNOSIS — F4311 Post-traumatic stress disorder, acute: Secondary | ICD-10-CM | POA: Diagnosis not present

## 2023-03-30 DIAGNOSIS — M6281 Muscle weakness (generalized): Secondary | ICD-10-CM | POA: Diagnosis not present

## 2023-04-04 DIAGNOSIS — F4311 Post-traumatic stress disorder, acute: Secondary | ICD-10-CM | POA: Diagnosis not present

## 2023-04-11 DIAGNOSIS — F4311 Post-traumatic stress disorder, acute: Secondary | ICD-10-CM | POA: Diagnosis not present

## 2023-04-15 ENCOUNTER — Telehealth: Payer: Self-pay | Admitting: Emergency Medicine

## 2023-04-15 ENCOUNTER — Ambulatory Visit: Payer: Self-pay

## 2023-04-15 ENCOUNTER — Ambulatory Visit
Admission: RE | Admit: 2023-04-15 | Discharge: 2023-04-15 | Disposition: A | Discharge status: Home / Self Care | Source: Ambulatory Visit | Attending: Internal Medicine | Admitting: Internal Medicine

## 2023-04-15 VITALS — BP 122/89 | HR 88 | Temp 98.1°F | Resp 17

## 2023-04-15 DIAGNOSIS — T6591XA Toxic effect of unspecified substance, accidental (unintentional), initial encounter: Secondary | ICD-10-CM

## 2023-04-15 MED ORDER — HYDROCORTISONE 1 % EX CREA
TOPICAL_CREAM | CUTANEOUS | 0 refills | Status: DC
Start: 2023-04-15 — End: 2023-04-15

## 2023-04-15 MED ORDER — HYDROCORTISONE 1 % EX CREA
TOPICAL_CREAM | CUTANEOUS | 0 refills | Status: AC
Start: 2023-04-15 — End: ?

## 2023-04-15 MED ORDER — PREDNISONE 20 MG PO TABS: 40.0000 mg | ORAL_TABLET | Freq: Every day | ORAL | 0 refills | Status: AC

## 2023-04-15 MED ORDER — PREDNISONE 20 MG PO TABS
40.0000 mg | ORAL_TABLET | Freq: Every day | ORAL | 0 refills | Status: DC
Start: 2023-04-15 — End: 2023-04-15

## 2023-04-15 NOTE — ED Triage Notes (Signed)
 Pt present with rash on neck since Tuesday. She used a combination of skin products which she think irritated her skin

## 2023-04-15 NOTE — Telephone Encounter (Signed)
 Copied from CRM [id number]. Topic: Clinical - Red Word Triage >> Apr 15, 2023  1:52 PM Almira Coaster wrote: Red Word that prompted transfer to Nurse Triage: Patient's calling because she is having an allergic reaction to a hyaluronic acid cream she has been using.  Chief Complaint: red face, itchy after using otc facial cream. Symptoms: see above  Frequency: started Tuesday Pertinent Negatives: Patient denies scratch marks, fever, numbness Disposition: [] ED /[x] Urgent Care (no appt availability in office) / [] Appointment(In office/virtual)/ []  [facility]/ [] Home Care/ [] Refused Recommended Disposition /[]  Mobile Bus/ []  Follow-up with PCP Additional Notes: instructed to go to UC due to no apts available; care advice given, denies questions; instructed to go to ER if becomes worse.    Reason for Disposition  [1] MODERATE-SEVERE widespread itching (i.e., interferes with sleep, normal activities or school) AND [2] not improved after 24 hours of itching Care Advice  Answer Assessment - Initial Assessment Questions 1. DESCRIPTION: "Describe the itching you are having."     Over the counter cream with hyaluronic acid; itchy rash 2. SEVERITY: "How bad is it?"    - MILD: Doesn't interfere with normal activities.   - MODERATE-SEVERE: Interferes with work, school, sleep, or other activities.      Looks like a burn on her face with itching; severe 3. SCRATCHING: "Are there any scratch marks? Bleeding?"     denies 4. ONSET: "When did this begin?"      Tuesday night 5. CAUSE: "What do you think is causing the itching?" (ask about swimming pools, pollen, animals, soaps, etc.)     allergy 6. OTHER SYMPTOMS: "Do you have any other symptoms?"      denies 7. PREGNANCY: "Is there any chance you are pregnant?" "When was your last menstrual period?"     na  Protocols used: Itching - [facility]

## 2023-04-15 NOTE — ED Provider Notes (Signed)
 [facility]    CSN: [id number] Arrival date & time: 04/15/23  1733      History   Chief Complaint Chief Complaint  Patient presents with   Rash    HPI Joyce Cortez is a 43 y.o. female.   Patient presents to urgent care for evaluation of to both sides of the neck that started 2 days ago 12 hours after trying a new hyaluronic acid cream to the neck.  She had never tried this in the past and did not know that she was allergic to this.  Itchy red rash developed up the morning after she placed the hyaluronic acid cream both sides of her neck.  States rash is itchy, irritated, and spreading.  No other new medications, fever, chills, nausea, vomiting, sore throat, shortness of breath, chest tightness, or exposure to any other known/potential allergens.  She has been applying banana peel to the rash without relief.   Rash   Past Medical History:  Diagnosis Date   ADHD (attention deficit hyperactivity disorder)    Allergy    Asthma    Fibromyalgia     Patient Active Problem List   Diagnosis Date Noted   Paronychia of finger of left hand 07/07/2021   Environmental and seasonal allergies 05/02/2018   Anxiety disorder 03/17/2016   Eczema 03/17/2016   Acne vulgaris 11/13/2012   Fibromyalgia 10/17/2012   Constipation 10/15/2011   Weakness of both lower extremities 03/25/2009   Myalgia 06/14/2008   Headache 01/26/2008   GERD 06/30/2007   DEPRESSION 10/27/2006   Attention deficit hyperactivity disorder (ADHD) 10/27/2006   Asthma 10/27/2006   UTI'S, CHRONIC 10/27/2006    Past Surgical History:  Procedure Laterality Date   WISDOM TOOTH EXTRACTION      OB History   No obstetric history on file.      Home Medications    Prior to Admission medications   Medication Sig Start Date End Date Taking? Authorizing Provider  hydrocortisone cream 1 % Apply to affected area 2 times daily 04/15/23  Yes Nayan Proch, Donavan Burnet, FNP  amphetamine-dextroamphetamine  (ADDERALL) 20 MG tablet Take 1 tablet (20 mg total) by mouth 2 (two) times daily. Patient not taking: Reported on 03/15/2023 08/11/22   Nelwyn Salisbury, MD  amphetamine-dextroamphetamine (ADDERALL) 20 MG tablet Take 1 tablet (20 mg total) by mouth 2 (two) times daily. Patient not taking: Reported on 03/15/2023 08/11/22   Nelwyn Salisbury, MD  amphetamine-dextroamphetamine (ADDERALL) 20 MG tablet Take 1 tablet (20 mg total) by mouth 2 (two) times daily. Patient not taking: Reported on 03/15/2023 08/11/22   Nelwyn Salisbury, MD  fexofenadine (ALLEGRA) 180 MG tablet Take 180 mg by mouth daily as needed.     [provider]  mupirocin ointment (BACTROBAN) 2 % Apply 1 Application topically 2 (two) times daily. Patient not taking: Reported on 03/15/2023 07/13/21   Panosh, Neta Mends, MD  predniSONE (DELTASONE) 20 MG tablet Take 2 tablets (40 mg total) by mouth daily with breakfast for 5 days. 04/15/23 04/20/23 Yes Abigal Choung, Donavan Burnet, FNP  tretinoin (RETIN-A) 0.025 % cream as directed Externally Pea size amount to whole face at night for 30 days    [provider]  triamcinolone cream (KENALOG) 0.1 % Apply 1 application. topically 2 (two) times daily. Patient not taking: Reported on 03/15/2023 06/12/21   Nelwyn Salisbury, MD  Turmeric 1053 MG TABS Take by mouth. Patient not taking: Reported on 03/15/2023    [provider]  Family History Family History  Adopted: Yes    Social History Social History   Tobacco Use   Smoking status: Former   Smokeless tobacco: Never  Substance Use Topics   Alcohol use: No    Alcohol/week: 0.0 standard drinks of alcohol   Drug use: Yes    Types: Marijuana    Comment: occ     Allergies   Sulfa antibiotics, Codeine, Doxycycline, and Pregabalin   Review of Systems Review of Systems  Skin:  Positive for rash.     Physical Exam Triage Vital Signs ED Triage Vitals  Encounter Vitals Group     BP 04/15/23 1758 122/89     Systolic BP  Percentile --      Diastolic BP Percentile --      Pulse Rate 04/15/23 1758 88     Resp 04/15/23 1758 17     Temp 04/15/23 1758 98.1 F (36.7 C)     Temp Source 04/15/23 1758 Oral     SpO2 04/15/23 1758 97 %     Weight --      Height --      Head Circumference --      Peak Flow --      Pain Score 04/15/23 1803 0     Pain Loc --      Pain Education --      Exclude from Growth Chart --    No data found.  Updated Vital Signs BP 122/89 (BP Location: Right Arm)   Pulse 88   Temp 98.1 F (36.7 C) (Oral)   Resp 17   LMP 03/30/2023 (Approximate)   SpO2 97%   Visual Acuity Right Eye Distance:   Left Eye Distance:   Bilateral Distance:    Right Eye Near:   Left Eye Near:    Bilateral Near:     Physical Exam Vitals and nursing note reviewed.  Constitutional:      Appearance: She is not ill-appearing or toxic-appearing.  HENT:     Head: Normocephalic and atraumatic.     Right Ear: Hearing and external ear normal.     Left Ear: Hearing and external ear normal.     Nose: Nose normal.     Mouth/Throat:     Lips: Pink.  Eyes:     General: Lids are normal. Vision grossly intact. Gaze aligned appropriately.     Extraocular Movements: Extraocular movements intact.     Conjunctiva/sclera: Conjunctivae normal.  Pulmonary:     Effort: Pulmonary effort is normal.  Musculoskeletal:     Cervical back: Neck supple.  Skin:    General: Skin is warm and dry.     Capillary Refill: Capillary refill takes less than 2 seconds.     Findings: Rash (Patchy maculopapular area of erythema to bilateral neck as seen in images below.) present.  Neurological:     General: No focal deficit present.     Mental Status: She is alert and oriented to person, place, and time. Mental status is at baseline.     Cranial Nerves: No dysarthria or facial asymmetry.  Psychiatric:        Mood and Affect: Mood normal.        Speech: Speech normal.        Behavior: Behavior normal.        Thought Content:  Thought content normal.        Judgment: Judgment normal.         UC Treatments / Results  Labs (  all labs ordered are listed, but only abnormal results are displayed) Labs Reviewed - No data to display  EKG   Radiology No results found.  Procedures Procedures (including critical care time)  Medications Ordered in UC Medications - No data to display  Initial Impression / Assessment and Plan / UC Course  I have reviewed the triage vital signs and the nursing notes.  Pertinent labs & imaging results that were available during my care of the patient were reviewed by me and considered in my medical decision making (see chart for details).   1.  Allergic reaction to chemical substance Presentation consistent with allergic reaction to new make-up product. Prednisone ordered. Hydrocortisone 1% cream twice daily for 7 days. Infection return precautions discussed. No signs of secondary bacterial infection currently.  Counseled patient on potential for adverse effects with medications prescribed/recommended today, strict ER and return-to-clinic precautions discussed, patient verbalized understanding.    Final Clinical Impressions(s) / UC Diagnoses   Final diagnoses:  Allergic reaction to chemical substance, accidental or unintentional, initial encounter   Discharge Instructions   None    ED Prescriptions     Medication Sig Dispense Auth. Provider   predniSONE (DELTASONE) 20 MG tablet Take 2 tablets (40 mg total) by mouth daily with breakfast for 5 days. 10 tablet Carlisle Beers, FNP   hydrocortisone cream 1 % Apply to affected area 2 times daily 15 g Carlisle Beers, FNP      PDMP not reviewed this encounter.   Carlisle Beers, [place] 04/15/23 [phone]

## 2023-04-18 DIAGNOSIS — Z113 Encounter for screening for infections with a predominantly sexual mode of transmission: Secondary | ICD-10-CM | POA: Diagnosis not present

## 2023-04-18 DIAGNOSIS — R300 Dysuria: Secondary | ICD-10-CM | POA: Diagnosis not present

## 2023-04-18 DIAGNOSIS — Z1159 Encounter for screening for other viral diseases: Secondary | ICD-10-CM | POA: Diagnosis not present

## 2023-04-18 DIAGNOSIS — Z118 Encounter for screening for other infectious and parasitic diseases: Secondary | ICD-10-CM | POA: Diagnosis not present

## 2023-04-18 DIAGNOSIS — F4311 Post-traumatic stress disorder, acute: Secondary | ICD-10-CM | POA: Diagnosis not present

## 2023-04-18 DIAGNOSIS — Z114 Encounter for screening for human immunodeficiency virus [HIV]: Secondary | ICD-10-CM | POA: Diagnosis not present

## 2023-04-18 NOTE — Telephone Encounter (Signed)
 Pt was seen at the UC on 04/15/23 for this problem

## 2023-04-25 DIAGNOSIS — F4311 Post-traumatic stress disorder, acute: Secondary | ICD-10-CM | POA: Diagnosis not present

## 2023-05-02 DIAGNOSIS — F4311 Post-traumatic stress disorder, acute: Secondary | ICD-10-CM | POA: Diagnosis not present

## 2023-05-03 ENCOUNTER — Telehealth: Payer: Self-pay

## 2023-05-03 ENCOUNTER — Other Ambulatory Visit: Payer: Self-pay

## 2023-05-03 DIAGNOSIS — G4733 Obstructive sleep apnea (adult) (pediatric): Secondary | ICD-10-CM

## 2023-05-03 DIAGNOSIS — R0683 Snoring: Secondary | ICD-10-CM

## 2023-05-03 NOTE — Telephone Encounter (Signed)
 Copied from CRM [id number]. Topic: Appointments - Scheduling Inquiry for Clinic >> May 02, 2023  1:11 PM Roseanne Cones wrote: Reason for CRM: Patient had a consult with Dr. Gaynell Keeler in 2023 and then, was scheduled for a Split night sleep study in Early 2024, however, the patient stated that she was asked to reschedule due to staffing issues - Patient is finally calling us  back to reschedule as she continues to snore and stops breathing in her sleep. Patient would like to reschedule the Split night study - Please call patient at [phone] >> May 02, 2023  1:31 PM Roseanne Cones wrote: Call dropped while verifying insurance - I left a voicemail for the patient - Patient will call back and confirm that their message got through. There may be a duplicate CRM as a result   Spoke with patient regarding prior message.Advised patient I have placed a new order for a in lab sleep study since patient never was able to get it done . Patient's voice was understanding.Nothing else further needed.

## 2023-05-09 DIAGNOSIS — F4311 Post-traumatic stress disorder, acute: Secondary | ICD-10-CM | POA: Diagnosis not present

## 2023-05-12 NOTE — Telephone Encounter (Signed)
 Approved for HST   Denied for In lab sleep study, notes do not state pt is unable to complete HST instead   [id number] valid from 05/12/23 til 07/10/23   Spoke with Cary Clarks B.

## 2023-05-12 NOTE — Telephone Encounter (Signed)
 If Dr. Brynn Caras this, Lynder Sanger can sch

## 2023-05-17 NOTE — Telephone Encounter (Signed)
Order has been placed for HST 

## 2023-05-17 NOTE — Addendum Note (Signed)
 Addended by: Goldie Later on: 05/17/2023 04:51 PM   Modules accepted: Orders

## 2023-05-18 ENCOUNTER — Encounter: Payer: Self-pay | Admitting: Family Medicine

## 2023-05-18 ENCOUNTER — Ambulatory Visit: Admitting: Family Medicine

## 2023-05-18 ENCOUNTER — Telehealth (INDEPENDENT_AMBULATORY_CARE_PROVIDER_SITE_OTHER): Admitting: Family Medicine

## 2023-05-18 DIAGNOSIS — L03116 Cellulitis of left lower limb: Secondary | ICD-10-CM | POA: Diagnosis not present

## 2023-05-18 DIAGNOSIS — S91312A Laceration without foreign body, left foot, initial encounter: Secondary | ICD-10-CM | POA: Diagnosis not present

## 2023-05-18 MED ORDER — CEPHALEXIN 500 MG PO CAPS: 500.0000 mg | ORAL_CAPSULE | Freq: Three times a day (TID) | ORAL | 0 refills | Status: DC

## 2023-05-18 NOTE — Progress Notes (Signed)
 Subjective:    Patient ID: Joyce Cortez, female    DOB: 06/29/1980, 43 y.o.   MRN: [id number]  HPI Virtual Visit via Video Note  I connected with the patient on 05/18/23 at  1:15 PM EDT by a video enabled telemedicine application and verified that I am speaking with the correct person using two identifiers.  Location patient: home Location provider:work or home office Persons participating in the virtual visit: patient, provider  I discussed the limitations of evaluation and management by telemedicine and the availability of in person appointments. The patient expressed understanding and agreed to proceed.   HPI: Here for an injury to the left foot. This occurred as she was walking in a tight pair of shoes 6 days ago. After removing the shoe, she saw a small cut across the foot. Since then it has been pink and tender. No drainage.    ROS: See pertinent positives and negatives per HPI.  Past Medical History:  Diagnosis Date   ADHD (attention deficit hyperactivity disorder)    Allergy    Asthma    Fibromyalgia     Past Surgical History:  Procedure Laterality Date   WISDOM TOOTH EXTRACTION      Family History  Adopted: Yes     Current Outpatient Medications:    fexofenadine (ALLEGRA) 180 MG tablet, Take 180 mg by mouth daily as needed. , Disp: , Rfl:    hydrocortisone  cream 1 %, Apply to affected area 2 times daily, Disp: 15 g, Rfl: 0   mupirocin  ointment (BACTROBAN ) 2 %, Apply 1 Application topically 2 (two) times daily., Disp: 30 g, Rfl: 2   tretinoin (RETIN-A) 0.025 % cream, as directed Externally Pea size amount to whole face at night for 30 days, Disp: , Rfl:    triamcinolone  cream (KENALOG ) 0.1 %, Apply 1 application. topically 2 (two) times daily., Disp: 45 g, Rfl: 5   Turmeric 1053 MG TABS, Take by mouth., Disp: , Rfl:    amphetamine -dextroamphetamine  (ADDERALL) 20 MG tablet, Take 1 tablet (20 mg total) by mouth 2 (two) times daily. (Patient not taking:  Reported on 05/18/2023), Disp: 60 tablet, Rfl: 0   amphetamine -dextroamphetamine  (ADDERALL) 20 MG tablet, Take 1 tablet (20 mg total) by mouth 2 (two) times daily. (Patient not taking: Reported on 05/18/2023), Disp: 60 tablet, Rfl: 0   amphetamine -dextroamphetamine  (ADDERALL) 20 MG tablet, Take 1 tablet (20 mg total) by mouth 2 (two) times daily. (Patient not taking: Reported on 05/18/2023), Disp: 60 tablet, Rfl: 0  EXAM:  VITALS per patient if applicable:  GENERAL: alert, oriented, appears well and in no acute distress  HEENT: atraumatic, conjunttiva clear, no obvious abnormalities on inspection of external nose and ears  NECK: normal movements of the head and neck  LUNGS: on inspection no signs of respiratory distress, breathing rate appears normal, no obvious gross SOB, gasping or wheezing  CV: no obvious cyanosis  MS: moves all visible extremities without noticeable abnormality  PSYCH/NEURO: pleasant and cooperative, no obvious depression or anxiety, speech and thought processing grossly intact  SKIN: there is a 1 cm superficial laceration on the dorsal left foot over the first MTP joint. The area is pink.  ASSESSMENT AND PLAN: Cellulitis, treat with 7 days of Keflex . Recheck as needed.  Corita Diego, MD  Discussed the following assessment and plan:  No diagnosis found.     I discussed the assessment and treatment plan with the patient. The patient was provided an opportunity to ask questions and all were  answered. The patient agreed with the plan and demonstrated an understanding of the instructions.   The patient was advised to call back or seek an in-person evaluation if the symptoms worsen or if the condition fails to improve as anticipated.      Review of Systems     Objective:   Physical Exam        Assessment & Plan:

## 2023-05-23 DIAGNOSIS — F4311 Post-traumatic stress disorder, acute: Secondary | ICD-10-CM | POA: Diagnosis not present

## 2023-05-24 ENCOUNTER — Ambulatory Visit: Admitting: Family Medicine

## 2023-05-24 ENCOUNTER — Encounter: Payer: Self-pay | Admitting: Family Medicine

## 2023-05-24 ENCOUNTER — Ambulatory Visit: Payer: Self-pay | Admitting: *Deleted

## 2023-05-24 ENCOUNTER — Telehealth (INDEPENDENT_AMBULATORY_CARE_PROVIDER_SITE_OTHER): Admitting: Family Medicine

## 2023-05-24 DIAGNOSIS — L03116 Cellulitis of left lower limb: Secondary | ICD-10-CM | POA: Diagnosis not present

## 2023-05-24 MED ORDER — LEVOFLOXACIN 500 MG PO TABS: 500.0000 mg | ORAL_TABLET | Freq: Every day | ORAL | 0 refills | Status: DC

## 2023-05-24 NOTE — Telephone Encounter (Signed)
 Seen today.

## 2023-05-24 NOTE — Telephone Encounter (Signed)
  Chief Complaint: dizziness noted upon awakening since starting antibiotic keflex  Symptoms: dizziness lightheaded and room spinning at times up awakening in am . Has had episode holding on to things when getting up. Drinking plenty of fluids. Reports taking allegra and Multivitiamins.  Frequency: since 05/08/23 Pertinent Negatives: Patient denies N/V no chest pain no difficulty breathing reported.  Disposition: [] ED /[] Urgent Care (no appt availability in office) / [x] Appointment(In office/virtual)/ []  [facility]/ [] Home Care/ [] Refused Recommended Disposition /[] [facility]/ []  Follow-up with PCP Additional Notes:    Scheduled appt today VV due to patient does not feel safe to drive. Please advise         Copied from CRM [id number]. Topic: Clinical - Red Word Triage >> May 24, 2023  1:59 PM [name] wrote: Red Word that prompted transfer to Nurse Triage: Patient having dizziness for about a week - wants to know if its because of the new medication she started cephALEXin  Reason for Disposition  [1] MODERATE dizziness (e.g., interferes with normal activities) AND [2] has NOT been evaluated by doctor (or NP/PA) for this  (Exception: Dizziness caused by heat exposure, sudden standing, or poor fluid intake.)  Answer Assessment - Initial Assessment Questions 1. DESCRIPTION: "Describe your dizziness."     Lightheaded  2. LIGHTHEADED: "Do you feel lightheaded?" (e.g., somewhat faint, woozy, weak upon standing)     When awakening room spinning  3. VERTIGO: "Do you feel like either you or the room is spinning or tilting?" (i.e. vertigo)     Room spinning when awakening  4. SEVERITY: "How bad is it?"  "Do you feel like you are going to faint?" "Can you stand and walk?"   - MILD: Feels slightly dizzy, but walking normally.   - MODERATE: Feels unsteady when walking, but not falling; interferes with normal activities (e.g., school, work).   - SEVERE: Unable to walk without  falling, or requires assistance to walk without falling; feels like passing out now.  Severe at times has to hold on to things at times when 1st wakes up  5. ONSET:  "When did the dizziness begin?"      05/08/23 6. AGGRAVATING FACTORS: "Does anything make it worse?" (e.g., standing, change in head position)     Standing  7. HEART RATE: "Can you tell me your heart rate?" "How many beats in 15 seconds?"  (Note: not all patients can do this)       na 8. CAUSE: "What do you think is causing the dizziness?"     Medication keflex  9. RECURRENT SYMPTOM: "Have you had dizziness before?" If Yes, ask: "When was the last time?" "What happened that time?"     Na 10. OTHER SYMPTOMS: "Do you have any other symptoms?" (e.g., fever, chest pain, vomiting, diarrhea, bleeding)       S/p cellulitis toe.  11. PREGNANCY: "Is there any chance you are pregnant?" "When was your last menstrual period?"       na  Protocols used: Dizziness - Lightheadedness-A-AH

## 2023-05-24 NOTE — Progress Notes (Signed)
 Subjective:    Patient ID: Joyce Cortez, female    DOB: July 26, 1980, 43 y.o.   MRN: [id number]  HPI Virtual Visit via Video Note  I connected with the patient on 05/24/23 at  2:45 PM EDT by a video enabled telemedicine application and verified that I am speaking with the correct person using two identifiers.  Location patient: home Location provider:work or home office Persons participating in the virtual visit: patient, provider  I discussed the limitations of evaluation and management by telemedicine and the availability of in person appointments. The patient expressed understanding and agreed to proceed.   HPI: Here to discuss some dizziness that she thinks is a side effect of the Keflex  she is taking. We saw her on 05-18-23 for a cellulitis in the left foot after a laceration. She was treated with Keflex , and the infection has improved since then. However in the past 2 days she has felt intermittent dizziness as if the room was spinning, and she is sure this is from the medication.   ROS: See pertinent positives and negatives per HPI.  Past Medical History:  Diagnosis Date   ADHD (attention deficit hyperactivity disorder)    Allergy    Asthma    Fibromyalgia     Past Surgical History:  Procedure Laterality Date   WISDOM TOOTH EXTRACTION      Family History  Adopted: Yes     Current Outpatient Medications:    cephALEXin  (KEFLEX ) 500 MG capsule, Take 1 capsule (500 mg total) by mouth 3 (three) times daily., Disp: 21 capsule, Rfl: 0   fexofenadine (ALLEGRA) 180 MG tablet, Take 180 mg by mouth daily as needed. , Disp: , Rfl:    hydrocortisone  cream 1 %, Apply to affected area 2 times daily, Disp: 15 g, Rfl: 0   mupirocin  ointment (BACTROBAN ) 2 %, Apply 1 Application topically 2 (two) times daily., Disp: 30 g, Rfl: 2   tretinoin (RETIN-A) 0.025 % cream, as directed Externally Pea size amount to whole face at night for 30 days, Disp: , Rfl:    triamcinolone  cream  (KENALOG ) 0.1 %, Apply 1 application. topically 2 (two) times daily., Disp: 45 g, Rfl: 5   Turmeric 1053 MG TABS, Take by mouth., Disp: , Rfl:   EXAM:  VITALS per patient if applicable:  GENERAL: alert, oriented, appears well and in no acute distress  HEENT: atraumatic, conjunttiva clear, no obvious abnormalities on inspection of external nose and ears  NECK: normal movements of the head and neck  LUNGS: on inspection no signs of respiratory distress, breathing rate appears normal, no obvious gross SOB, gasping or wheezing  CV: no obvious cyanosis  MS: moves all visible extremities without noticeable abnormality  PSYCH/NEURO: pleasant and cooperative, no obvious depression or anxiety, speech and thought processing grossly intact  ASSESSMENT AND PLAN: Cellulitis of the left foot. We will stop the Keflex  and we will start her on 10 days of Levaquin . Recheck as needed.  Corita Diego, MD   Discussed the following assessment and plan:  No diagnosis found.     I discussed the assessment and treatment plan with the patient. The patient was provided an opportunity to ask questions and all were answered. The patient agreed with the plan and demonstrated an understanding of the instructions.   The patient was advised to call back or seek an in-person evaluation if the symptoms worsen or if the condition fails to improve as anticipated.      Review of Systems  Objective:   Physical Exam        Assessment & Plan:

## 2023-05-25 ENCOUNTER — Encounter: Payer: Self-pay | Admitting: Family Medicine

## 2023-05-25 ENCOUNTER — Telehealth: Payer: Self-pay

## 2023-05-25 ENCOUNTER — Telehealth: Payer: Self-pay | Admitting: Family Medicine

## 2023-05-25 ENCOUNTER — Ambulatory Visit: Payer: Self-pay

## 2023-05-25 NOTE — Telephone Encounter (Signed)
 Let's see how it does without antibiotics. Follow up as needed

## 2023-05-25 NOTE — Telephone Encounter (Signed)
 Copied from CRM [id number]. Topic: Clinical - Prescription Issue >> May 25, 2023  1:41 PM Palma Bob wrote: Reason for CRM: Patient states she is having some difficulties sleeping and in a lot of pain since she has taken two new medications that were prescribed to her yesterday. She is asking if there's any other alternatives or another solution. She states that the levofloxacin  (LEVAQUIN ) 500 MG tablet- is causing a very sharp in her stomach, feeling stabbing and nauseous. She states she will not be taking it anymore.

## 2023-05-25 NOTE — Telephone Encounter (Signed)
 Copied from CRM [id number]. Topic: Clinical - Prescription Issue >> May 25, 2023  5:00 PM Joyce Cortez wrote: Reason for CRM: PT IS CALLING BACK ABOUT THE PRESCIPTION THAT WAS GIVEN TO HER ON YESTERDAY STATED SHE CAN'T TAKE IT. THIS WAS HER 3RD CALL REQUESITNG THE DRY SEND HER SOMETHING OTHER THAN THE MED HE PRESCRIBED

## 2023-05-25 NOTE — Telephone Encounter (Signed)
 Pt calling to let PCP know she will not cont levaquin  as it caused GI distress for most of the night. Pt would like to know if there will be any other medication that he would like to try. Pt states that the toe is looking better. If sending a new rx tonight pt requests using [facility] [address], [place].   Copied from CRM [id number]. Topic: Clinical - Medical Advice >> May 25, 2023  4:24 PM Rosaria Common wrote: Reason for CRM: Patient states she is having some difficulties sleeping and in a lot of pain since she has taken two new medications that were prescribed to her yesterday. She is asking if there's any other alternatives or another solution. She states that the levofloxacin  (LEVAQUIN ) 500 MG tablet- is causing a very sharp in her stomach, feeling stabbing and nauseous. She states she will not be taking it anymore.

## 2023-05-25 NOTE — Telephone Encounter (Signed)
 Message sent to PCP for advise ?

## 2023-05-26 ENCOUNTER — Ambulatory Visit: Payer: Self-pay

## 2023-05-26 MED ORDER — AMOXICILLIN 500 MG PO CAPS: 500.0000 mg | ORAL_CAPSULE | Freq: Three times a day (TID) | ORAL | 0 refills | Status: DC

## 2023-05-26 NOTE — Telephone Encounter (Signed)
 I sent in a week of Amoxicillin  (she has had this many times before)

## 2023-05-26 NOTE — Telephone Encounter (Signed)
 Copied from CRM [id number]. Topic: Clinical - Red Word Triage >> May 26, 2023 11:40 AM Magdalene School wrote: Red Word that prompted transfer to Nurse Triage: stabbing pain.  Pt frustrated that it took so many phone calls and that med was called into wrong pharmacy despite instructions she left in the pt message. Called [facility] and spoke to Armstrong who stated he will [facility] [address] Called pt and LM on VM. Asked pt to CB to make sure she got the message. Advised that pharmacy will call her when med ready.   Reason for Disposition . Prescription request for new medicine (not a refill)  Protocols used: Medication Question Call-A-AH

## 2023-05-27 NOTE — Telephone Encounter (Signed)
Called pt left a message to call the office back.

## 2023-05-27 NOTE — Telephone Encounter (Signed)
 Message sent to PCP for advise ?

## 2023-05-30 DIAGNOSIS — F4311 Post-traumatic stress disorder, acute: Secondary | ICD-10-CM | POA: Diagnosis not present

## 2023-06-07 DIAGNOSIS — F4311 Post-traumatic stress disorder, acute: Secondary | ICD-10-CM | POA: Diagnosis not present

## 2023-06-14 DIAGNOSIS — F4311 Post-traumatic stress disorder, acute: Secondary | ICD-10-CM | POA: Diagnosis not present

## 2023-06-17 ENCOUNTER — Encounter

## 2023-06-20 DIAGNOSIS — F4311 Post-traumatic stress disorder, acute: Secondary | ICD-10-CM | POA: Diagnosis not present

## 2023-06-23 ENCOUNTER — Encounter

## 2023-06-27 ENCOUNTER — Telehealth: Payer: Self-pay | Admitting: Pulmonary Disease

## 2023-06-27 ENCOUNTER — Encounter

## 2023-06-27 DIAGNOSIS — F4311 Post-traumatic stress disorder, acute: Secondary | ICD-10-CM | POA: Diagnosis not present

## 2023-06-27 NOTE — Telephone Encounter (Signed)
 Pt returned call and spoke w/ her. Stated that her coverage was paid for at the beginning of the month, so she is going to give them a call to verify that everything is correct. I did state that I will assist on my behalf as well.  She was currently in an appt, so we may expect an additional call back from pt this afternoon to f/u on this.

## 2023-06-27 NOTE — Telephone Encounter (Signed)
 Attempted to give patient a call regarding insurance coverage. At this time are not showing that [employer] is active. No answer, but left VM and will send patient a MyChart msg.   If patient is to return call, we will need to verify if there is any updated insurance information or we can assist w/ a self-pay estimate. Pt will need to just bring any updated information to their appt if they have it available.

## 2023-07-04 ENCOUNTER — Encounter

## 2023-07-04 DIAGNOSIS — F4311 Post-traumatic stress disorder, acute: Secondary | ICD-10-CM | POA: Diagnosis not present

## 2023-07-11 ENCOUNTER — Ambulatory Visit

## 2023-07-11 DIAGNOSIS — F4311 Post-traumatic stress disorder, acute: Secondary | ICD-10-CM | POA: Diagnosis not present

## 2023-07-11 DIAGNOSIS — G4733 Obstructive sleep apnea (adult) (pediatric): Secondary | ICD-10-CM

## 2023-07-18 DIAGNOSIS — F4311 Post-traumatic stress disorder, acute: Secondary | ICD-10-CM | POA: Diagnosis not present

## 2023-07-25 DIAGNOSIS — F4311 Post-traumatic stress disorder, acute: Secondary | ICD-10-CM | POA: Diagnosis not present

## 2023-07-28 ENCOUNTER — Telehealth: Payer: Self-pay | Admitting: Pulmonary Disease

## 2023-07-28 DIAGNOSIS — G4733 Obstructive sleep apnea (adult) (pediatric): Secondary | ICD-10-CM

## 2023-07-28 NOTE — Telephone Encounter (Signed)
 Call patient  Sleep study result  Date of study: 07/13/2023  Impression: Moderate obstructive sleep apnea with mild oxygen desaturations AHI of 18.6, O2 nadir of 86%  Recommendation: DME referral  Recommend CPAP therapy for moderate obstructive sleep apnea  Auto titrating CPAP with pressure settings of 5-15 will be appropriate, with heated humidification with patient's mask of choice.  Encourage weight loss measures  Follow-up in the office 4 to 6 weeks following initiation of treatment

## 2023-07-29 NOTE — Telephone Encounter (Signed)
ATC x1 LVM for patient to call  our office back regarding sleep study result's.  

## 2023-08-01 DIAGNOSIS — F4311 Post-traumatic stress disorder, acute: Secondary | ICD-10-CM | POA: Diagnosis not present

## 2023-08-02 NOTE — Telephone Encounter (Signed)
 Copied from CRM [id number]. Topic: Clinical - Lab/Test Results >> Aug 02, 2023 12:13 PM Celestine FALCON wrote: Reason for CRM: Pt is calling back after missing a couple calls regarding her sleep study results. Please call the pt back at [phone] anytime the rest of the day. Pt is off of work today, and didn't answer due to taking a nap.  ATC x3. No answer- lvm to call back for sleep study results     Sleep study result   Date of study: 07/13/2023   Impression: Moderate obstructive sleep apnea with mild oxygen desaturations AHI of 18.6, O2 nadir of 86%   Recommendation: DME referral   Recommend CPAP therapy for moderate obstructive sleep apnea   Auto titrating CPAP with pressure settings of 5-15 will be appropriate, with heated humidification with patient's mask of choice.   Encourage weight loss measures   Follow-up in the office 4 to 6 weeks following initiation of treatment

## 2023-08-02 NOTE — Telephone Encounter (Signed)
 Copied from CRM [id number]. Topic: Clinical - Lab/Test Results >> Jul 29, 2023  2:09 PM Isabell A wrote: Reason for CRM: Patient returning missed phone call to go over sleep study results.   Callback number: [phone]  ATC x2. Unable to reach pt, phone went straight to vm. Left message for pt to call back.

## 2023-08-02 NOTE — Telephone Encounter (Signed)
 Copied from CRM [id number]. Topic: Clinical - Lab/Test Results >> Aug 02, 2023  2:28 PM Chantha C wrote: Patient missed Joyce Cortez's call on sleep study results, please call back [phone].   Called and spoke with patient regarding HST results. Pt would like to start on cpap therapy.  Order placed and pt is aware. Nfn

## 2023-08-04 ENCOUNTER — Telehealth: Payer: Self-pay | Admitting: Pulmonary Disease

## 2023-08-04 NOTE — Telephone Encounter (Signed)
 Per Glade at [facility] I have pulled the order but due to the office visit that ordered the testing is from 12/17/2021 I do not see an office within the last year that discuss her having a sleep test. We are going to need a current office note that discuss the reason for the delay. please advise Thank you

## 2023-08-05 NOTE — Telephone Encounter (Signed)
 Schedule office visit with myself or APP

## 2023-08-08 DIAGNOSIS — F4311 Post-traumatic stress disorder, acute: Secondary | ICD-10-CM | POA: Diagnosis not present

## 2023-08-08 NOTE — Telephone Encounter (Signed)
 Pt has not had an initial appt w/ any dr yet.  Pt dropped off device earlier.  The only Wed she has available is Wed 7/30 in the afternoon.  Other than that, it needs to be Monday of Tues. Pt called during lunch.  No appts available that I can schedule.

## 2023-08-08 NOTE — Telephone Encounter (Signed)
ATC x1 LVM for patient to call our office back. 

## 2023-08-29 DIAGNOSIS — F4311 Post-traumatic stress disorder, acute: Secondary | ICD-10-CM | POA: Diagnosis not present

## 2023-09-05 DIAGNOSIS — F4311 Post-traumatic stress disorder, acute: Secondary | ICD-10-CM | POA: Diagnosis not present

## 2023-09-09 ENCOUNTER — Telehealth: Payer: Self-pay

## 2023-09-09 NOTE — Telephone Encounter (Signed)
 ATC x1 LVM on patient's phone that per Dr.Olalere patient will need to r/s office visit on 09/26/2023 due to provider has to recert with sleep lab . Patient has been scheduled on 09/27/2023 and patient has called back .  Patient's voice was understanding . Nothing else further needed

## 2023-09-12 DIAGNOSIS — F4311 Post-traumatic stress disorder, acute: Secondary | ICD-10-CM | POA: Diagnosis not present

## 2023-09-14 NOTE — Telephone Encounter (Signed)
 Pt has an app 9/9

## 2023-09-20 DIAGNOSIS — F4311 Post-traumatic stress disorder, acute: Secondary | ICD-10-CM | POA: Diagnosis not present

## 2023-09-21 DIAGNOSIS — Z118 Encounter for screening for other infectious and parasitic diseases: Secondary | ICD-10-CM | POA: Diagnosis not present

## 2023-09-21 DIAGNOSIS — Z113 Encounter for screening for infections with a predominantly sexual mode of transmission: Secondary | ICD-10-CM | POA: Diagnosis not present

## 2023-09-21 DIAGNOSIS — Z114 Encounter for screening for human immunodeficiency virus [HIV]: Secondary | ICD-10-CM | POA: Diagnosis not present

## 2023-09-21 DIAGNOSIS — Z1159 Encounter for screening for other viral diseases: Secondary | ICD-10-CM | POA: Diagnosis not present

## 2023-09-26 ENCOUNTER — Ambulatory Visit: Admitting: Pulmonary Disease

## 2023-09-26 DIAGNOSIS — F4311 Post-traumatic stress disorder, acute: Secondary | ICD-10-CM | POA: Diagnosis not present

## 2023-09-27 ENCOUNTER — Ambulatory Visit (INDEPENDENT_AMBULATORY_CARE_PROVIDER_SITE_OTHER): Admitting: Pulmonary Disease

## 2023-09-27 VITALS — BP 112/70 | HR 84 | Ht 64.0 in | Wt 167.0 lb

## 2023-09-27 DIAGNOSIS — G4733 Obstructive sleep apnea (adult) (pediatric): Secondary | ICD-10-CM | POA: Diagnosis not present

## 2023-09-27 DIAGNOSIS — Z87891 Personal history of nicotine dependence: Secondary | ICD-10-CM

## 2023-09-27 NOTE — Patient Instructions (Addendum)
 Referral to dentist for an oral device for management of obstructive sleep apnea  An oral device may be the best option for you  CPAP can be considered if you are not a good candidate for an oral device  A dentist may also be able to give you more information about options for myofunctional therapy  Continue weight loss efforts as any significant amount of weight loss will also affect the back of your throat  Sleep position -Trying your best not to sleep on your back helps most of us , does not matter whether it is on your left side or right side  Tentative follow-up in about 6 months

## 2023-09-27 NOTE — Progress Notes (Signed)
 Joyce Cortez    [id number]    10/21/1980  Primary Care Physician:Fry, Garnette LABOR, MD  Referring Physician: Johnny Garnette LABOR, MD [address] [place],  [place] [address]  Chief complaint:   Patient with snoring, witnessed apneas nonrestorative sleep  HPI:  Patient did have a sleep study performed showing moderate sleep apnea with very minimal oxygen desaturations  As always download Has had witnessed apneas  Symptoms have been unchanged  History of fibromyalgia  Not aware of family history  Denies choking episodes or palpitations at night  Discussed options of treatment including an oral device, CPAP therapy  Patient interested in myofunctional therapy, devices that may position with tongue out of airway - We did look up the zyppah, tongue retainers - Did look up CPAP masks, does not think she will like a fullface mask but she is a mouth breather, may need to try a nasal mask with chinstrap  Outpatient Encounter Medications as of 09/27/2023  Medication Sig   hydrocortisone  cream 1 % Apply to affected area 2 times daily   mupirocin  ointment (BACTROBAN ) 2 % Apply 1 Application topically 2 (two) times daily.   Prenatal Multivit-Min-Fe-FA (PRENATAL 1 + IRON PO) Prenatal   tretinoin (RETIN-A) 0.025 % cream as directed Externally Pea size amount to whole face at night for 30 days   Turmeric 1053 MG TABS Take by mouth.   amoxicillin  (AMOXIL ) 500 MG capsule Take 1 capsule (500 mg total) by mouth 3 (three) times daily. (Patient not taking: Reported on 09/27/2023)   fexofenadine (ALLEGRA) 180 MG tablet Take 180 mg by mouth daily as needed.  (Patient not taking: Reported on 09/27/2023)   triamcinolone  cream (KENALOG ) 0.1 % Apply 1 application. topically 2 (two) times daily. (Patient not taking: Reported on 09/27/2023)   No facility-administered encounter medications on file as of 09/27/2023.    Allergies as of 09/27/2023 - Review Complete 09/27/2023  Allergen Reaction  Noted   Sulfa antibiotics  08/09/2017   Codeine Nausea Only 11/29/2013   Doxycycline  Nausea And Vomiting 02/03/2016   Keflex  [cephalexin ] Other (See Comments) 05/24/2023   Levaquin  [levofloxacin ] Nausea And Vomiting 05/26/2023   Pregabalin Other (See Comments) 06/24/2016    Past Medical History:  Diagnosis Date   ADHD (attention deficit hyperactivity disorder)    Allergy    Asthma    Fibromyalgia     Past Surgical History:  Procedure Laterality Date   WISDOM TOOTH EXTRACTION      Family History  Adopted: Yes    Social History   Socioeconomic History   Marital status: Single    Spouse name: Not on file   Number of children: 0   Years of education: Not on file   Highest education level: Not on file  Occupational History   Occupation: Event organiser  Tobacco Use   Smoking status: Former   Smokeless tobacco: Never  Substance and Sexual Activity   Alcohol use: No    Alcohol/week: 0.0 standard drinks of alcohol   Drug use: Yes    Types: Marijuana    Comment: occ   Sexual activity: Not on file  Other Topics Concern   Not on file  Social History Narrative   Not on file   Social Drivers of Health   Financial Resource Strain: Not on file  Food Insecurity: Not on file  Transportation Needs: Not on file  Physical Activity: Not on file  Stress: Not on file  Social Connections: Not  on file  Intimate Partner Violence: Not on file    Review of Systems  Psychiatric/Behavioral:  Positive for sleep disturbance.     Vitals:   09/27/23 1511  BP: 112/70  Pulse: 84  SpO2: 95%   Physical Exam Constitutional:      Appearance: Normal appearance.  HENT:     Head: Normocephalic.     Mouth/Throat:     Mouth: Mucous membranes are moist.     Comments: Mallampati 2, crowded oropharynx Eyes:     General: No scleral icterus. Cardiovascular:     Rate and Rhythm: Normal rate and regular rhythm.     Heart sounds: No murmur heard.    No friction rub.   Pulmonary:     Effort: No respiratory distress.     Breath sounds: No stridor. No wheezing or rhonchi.  Musculoskeletal:     Cervical back: No rigidity or tenderness.  Neurological:     Mental Status: She is alert.  Psychiatric:        Mood and Affect: Mood normal.       12/17/2021    4:00 PM  Results of the Epworth flowsheet  Sitting and reading 3  Watching TV 3  Sitting, inactive in a public place (e.g. a theatre or a meeting) 0  As a passenger in a car for an hour without a break 2  Lying down to rest in the afternoon when circumstances permit 1  Sitting and talking to someone 0  Sitting quietly after a lunch without alcohol 0  In a car, while stopped for a few minutes in traffic 0  Total score 9    Data Reviewed: Sleep study with moderate obstructive sleep apnea with normal oxygen desaturations  Assessment:  Moderate obstructive sleep apnea with AHI of 18.6 and total  With discussions with the patient I think she will be best served with an oral device, she had tried a device previously for teeth grinding that did not work very well, did cause irritation of the gums so she stopped using it  We did look up myofunctional therapy  Other options of treatment discussed  Plan/Recommendations: Will place referral to dental sleep medicine  I believe they may also have more information for her about myofunctional therapy  Encouraged continued weight loss  Sleep position optimization, avoiding supine sleep as best as possible  Tentative follow-up in about 6 months - May yes not need to follow-up if she is feeling a lot better  Jennet Epley MD [facility] 09/27/2023, 3:46 PM  CC: Johnny Garnette LABOR, MD

## 2023-10-03 DIAGNOSIS — F4311 Post-traumatic stress disorder, acute: Secondary | ICD-10-CM | POA: Diagnosis not present

## 2023-10-10 DIAGNOSIS — F4311 Post-traumatic stress disorder, acute: Secondary | ICD-10-CM | POA: Diagnosis not present

## 2023-10-17 DIAGNOSIS — R8290 Unspecified abnormal findings in urine: Secondary | ICD-10-CM | POA: Diagnosis not present

## 2023-10-17 DIAGNOSIS — N760 Acute vaginitis: Secondary | ICD-10-CM | POA: Diagnosis not present

## 2023-10-17 DIAGNOSIS — F4311 Post-traumatic stress disorder, acute: Secondary | ICD-10-CM | POA: Diagnosis not present

## 2023-10-17 DIAGNOSIS — Z118 Encounter for screening for other infectious and parasitic diseases: Secondary | ICD-10-CM | POA: Diagnosis not present

## 2023-10-21 DIAGNOSIS — F4311 Post-traumatic stress disorder, acute: Secondary | ICD-10-CM | POA: Diagnosis not present

## 2023-10-24 DIAGNOSIS — F4311 Post-traumatic stress disorder, acute: Secondary | ICD-10-CM | POA: Diagnosis not present

## 2023-10-31 DIAGNOSIS — F4311 Post-traumatic stress disorder, acute: Secondary | ICD-10-CM | POA: Diagnosis not present

## 2023-11-03 ENCOUNTER — Ambulatory Visit: Payer: Self-pay

## 2023-11-03 ENCOUNTER — Encounter: Payer: Self-pay | Admitting: Family Medicine

## 2023-11-03 ENCOUNTER — Telehealth: Admitting: Physician Assistant

## 2023-11-03 ENCOUNTER — Telehealth: Admitting: Family Medicine

## 2023-11-03 DIAGNOSIS — J329 Chronic sinusitis, unspecified: Secondary | ICD-10-CM | POA: Diagnosis not present

## 2023-11-03 MED ORDER — CLINDAMYCIN HCL 300 MG PO CAPS
300.0000 mg | ORAL_CAPSULE | Freq: Two times a day (BID) | ORAL | 0 refills | Status: DC
Start: 2023-11-03 — End: 2023-11-07

## 2023-11-03 NOTE — Progress Notes (Signed)
 Pt schedule for e-visit, however pt got off line and did not return until 5 pm.   Will need to reschedule. Suggest an in person appt with pcp given complaint of CP.   Clotilda Single, MD

## 2023-11-03 NOTE — Telephone Encounter (Signed)
 FYI Only or Action Required?: FYI only for provider.  Patient was last seen in primary care on 11/03/2023 by Mercer Clotilda SAUNDERS, MD.  Called Nurse Triage reporting Covid Positive.  Symptoms began several days ago.  Interventions attempted: Rest, hydration, or home remedies.  Symptoms are: unchanged.  Triage Disposition: See HCP Within 4 Hours (Or PCP Triage)  Patient/caregiver understands and will follow disposition?: Yes   Copied from CRM [id number]. Topic: Clinical - Red Word Triage >> Nov 03, 2023  5:20 PM Joyce Cortez wrote: Red Word that prompted transfer to Nurse Triage: Patient is experiencing chest pains Reason for Disposition  MILD difficulty breathing (e.Cortez., minimal/no SOB at rest, SOB with walking, pulse < 100)  Answer Assessment - Initial Assessment Questions Additional info:  1) States she waited over one hour for virtual visit. She received message that doc was running late, she was logged in at [phone]. She is logging out of virtual waiting room now while on this call. She states she received a text to join appointment at 330 then another that her visit was starting at 415 and to join but was already in waiting room.   2) patient is denying chest pain she states she is having the same sensation as her previous Covid 19 infection, mild central chest pain when throat clearing of congestion.     1. SYMPTOMS: What is your main symptom or concern? (e.Cortez., cough, fever, shortness of breath, muscle aches)      Throat irritation 2. ONSET: When did the symptoms start?      Several days 3. COUGH: Do you have a cough? If Yes, ask: How bad is the cough?       Denies  4. FEVER: Do you have a fever? If Yes, ask: What is your temperature, how was it measured, and when did it start?     denies 5. BREATHING DIFFICULTY: Are you having any difficulty breathing? (e.Cortez., normal; shortness of breath, wheezing, unable to speak)      Intermittent shortness of breath 6.  BETTER-SAME-WORSE: Are you getting better, staying the same or getting worse compared to yesterday?  If getting worse, ask, In what way?     worse 7. OTHER SYMPTOMS: Do you have any other symptoms?  (e.Cortez., chills, fatigue, headache, loss of smell or taste, muscle pain, sore throat)     Fatigue, chest heaviness feels the same as previous Covid 19.  8. COVID-19 DIAGNOSIS: How do you know that you have COVID? (e.Cortez., positive lab test or self-test, diagnosed by doctor or NP/PA, symptoms after exposure).     Not tested 9. COVID-19 EXPOSURE: Was there any known exposure to COVID before the symptoms began?      Yes coworkers 10. COVID-19 VACCINE: Have you had the COVID-19 vaccine? If Yes, ask: When did you last get it?        11. HIGH RISK DISEASE: Do you have any chronic medical problems? (e.Cortez., asthma, heart or lung disease, weak immune system, obesity, etc.)       asthma  12. O2 SATURATION MONITOR:  Do you use an oxygen saturation monitor (pulse oximeter) at home? If Yes, ask What is your reading (oxygen level) today? What is your usual oxygen saturation reading? (e.Cortez., 95%)  Protocols used: COVID-19 - Diagnosed or Suspected-A-AH

## 2023-11-03 NOTE — Progress Notes (Signed)
 Virtual Visit Consent   Joyce Cortez, you are scheduled for a virtual visit with a [facility] provider today. Just as with appointments in the office, your consent must be obtained to participate. Your consent will be active for this visit and any virtual visit you may have with one of our providers in the next 365 days. If you have a MyChart account, a copy of this consent can be sent to you electronically.  As this is a virtual visit, video technology does not allow for your provider to perform a traditional examination. This may limit your provider's ability to fully assess your condition. If your provider identifies any concerns that need to be evaluated in person or the need to arrange testing (such as labs, EKG, etc.), we will make arrangements to do so. Although advances in technology are sophisticated, we cannot ensure that it will always work on either your end or our end. If the connection with a video visit is poor, the visit may have to be switched to a telephone visit. With either a video or telephone visit, we are not always able to ensure that we have a secure connection.  By engaging in this virtual visit, you consent to the provision of healthcare and authorize for your insurance to be billed (if applicable) for the services provided during this visit. Depending on your insurance coverage, you may receive a charge related to this service.  I need to obtain your verbal consent now. Are you willing to proceed with your visit today? Joyce Cortez has provided verbal consent on 11/03/2023 for a virtual visit (video or telephone). Joyce Cortez, [place]  Date: 11/03/2023 7:09 PM   Virtual Visit via Video Note   I, Joyce Cortez, connected with  Joyce Cortez  ([id number], 1980/07/27) on 11/03/23 at  6:45 PM EDT by a video-enabled telemedicine application and verified that I am speaking with the correct person using two identifiers.  Location: Patient: Virtual Visit Location  Patient: Home Provider: Virtual Visit Location Provider: Home Office   I discussed the limitations of evaluation and management by telemedicine and the availability of in person appointments. The patient expressed understanding and agreed to proceed.    History of Present Illness: Joyce Cortez is a 43 y.o. who identifies as a female who was assigned female at birth, and is being seen today for URI symptoms.  HPI: 43y/o F presents for a telehealth video visit for c/o  sinus pressure, pain, rhinorrhea, watery eyes x 8 days and not improving. She initially had fever and extremely tired and coughing. +h/o allergies. Has been using Neti pot to help with sinus drainage.     Problems:  Patient Active Problem List   Diagnosis Date Noted   Paronychia of finger of left hand 07/07/2021   Environmental and seasonal allergies 05/02/2018   Anxiety disorder 03/17/2016   Eczema 03/17/2016   Acne vulgaris 11/13/2012   Fibromyalgia 10/17/2012   Constipation 10/15/2011   Weakness of both lower extremities 03/25/2009   Myalgia 06/14/2008   Headache 01/26/2008   GERD 06/30/2007   DEPRESSION 10/27/2006   Attention deficit hyperactivity disorder (ADHD) 10/27/2006   Asthma 10/27/2006   UTI'S, CHRONIC 10/27/2006    Allergies:  Allergies  Allergen Reactions   Sulfa Antibiotics     RASH, FEVER AND HEADACHE    Codeine Nausea Only   Doxycycline  Nausea And Vomiting   Keflex  [Cephalexin ] Other (See Comments)    dizziness   Levaquin  [Levofloxacin ] Nausea And Vomiting   Pregabalin Other (  See Comments)   Medications:  Current Outpatient Medications:    clindamycin  (CLEOCIN ) 300 MG capsule, Take 1 capsule (300 mg total) by mouth 2 (two) times daily for 5 days., Disp: 10 capsule, Rfl: 0   amoxicillin  (AMOXIL ) 500 MG capsule, Take 1 capsule (500 mg total) by mouth 3 (three) times daily. (Patient not taking: Reported on 11/03/2023), Disp: 21 capsule, Rfl: 0   fexofenadine (ALLEGRA) 180 MG tablet, Take  180 mg by mouth daily as needed. , Disp: , Rfl:    hydrocortisone  cream 1 %, Apply to affected area 2 times daily, Disp: 15 g, Rfl: 0   mupirocin  ointment (BACTROBAN ) 2 %, Apply 1 Application topically 2 (two) times daily., Disp: 30 g, Rfl: 2   Prenatal Multivit-Min-Fe-FA (PRENATAL 1 + IRON PO), Prenatal, Disp: , Rfl:    tretinoin (RETIN-A) 0.025 % cream, as directed Externally Pea size amount to whole face at night for 30 days, Disp: , Rfl:    triamcinolone  cream (KENALOG ) 0.1 %, Apply 1 application. topically 2 (two) times daily., Disp: 45 g, Rfl: 5   Turmeric 1053 MG TABS, Take by mouth., Disp: , Rfl:   Observations/Objective: Patient is well-developed, well-nourished in no acute distress.  Resting comfortably  at home.  Head is normocephalic, atraumatic.  No labored breathing.  Speech is clear and coherent with logical content.  Patient is alert and oriented at baseline.    Assessment and Plan: 1. Sinusitis, unspecified chronicity, unspecified location (Primary) - clindamycin  (CLEOCIN ) 300 MG capsule; Take 1 capsule (300 mg total) by mouth 2 (two) times daily for 5 days.  Dispense: 10 capsule; Refill: 0  Stay well hydrated with clear liquids. Re-start oral anti-histamine. Take otc Tylenol Multi-Symptom medicine for symptom improvement. Start oral antibiotic only if symptoms don't improve. Schedule a virtual appointment or follow up at an urgent care clinic if symptoms don't improve.  Pt verbalized understanding and in agreement.      Follow Up Instructions: I discussed the assessment and treatment plan with the patient. The patient was provided an opportunity to ask questions and all were answered. The patient agreed with the plan and demonstrated an understanding of the instructions.  A copy of instructions were sent to the patient via MyChart unless otherwise noted below.   Patient has requested to receive PHI (AVS, Work Notes, etc) pertaining to this video visit through  e-mail as they are currently without active MyChart. They have voiced understand that email is not considered secure and their health information could be viewed by someone other than the patient.   The patient was advised to call back or seek an in-person evaluation if the symptoms worsen or if the condition fails to improve as anticipated.    Morry Veiga, PA-C

## 2023-11-03 NOTE — Patient Instructions (Signed)
  Steva Shake, thank you for joining [name], PA-C for today's virtual visit.  While this provider is not your primary care provider (PCP), if your PCP is located in our provider database this encounter information will be shared with them immediately following your visit.   A [facility] MyChart account gives you access to today's visit and all your visits, tests, and labs performed at [facility]  click here if you don't have a [facility] MyChart account or go to [email or web address]  Consent: (Patient) Fumiye Lubben provided verbal consent for this virtual visit at the beginning of the encounter.  Current Medications:  Current Outpatient Medications:    clindamycin  (CLEOCIN ) 300 MG capsule, Take 1 capsule (300 mg total) by mouth 2 (two) times daily for 5 days., Disp: 10 capsule, Rfl: 0   amoxicillin  (AMOXIL ) 500 MG capsule, Take 1 capsule (500 mg total) by mouth 3 (three) times daily. (Patient not taking: Reported on 11/03/2023), Disp: 21 capsule, Rfl: 0   fexofenadine (ALLEGRA) 180 MG tablet, Take 180 mg by mouth daily as needed. , Disp: , Rfl:    hydrocortisone  cream 1 %, Apply to affected area 2 times daily, Disp: 15 g, Rfl: 0   mupirocin  ointment (BACTROBAN ) 2 %, Apply 1 Application topically 2 (two) times daily., Disp: 30 g, Rfl: 2   Prenatal Multivit-Min-Fe-FA (PRENATAL 1 + IRON PO), Prenatal, Disp: , Rfl:    tretinoin (RETIN-A) 0.025 % cream, as directed Externally Pea size amount to whole face at night for 30 days, Disp: , Rfl:    triamcinolone  cream (KENALOG ) 0.1 %, Apply 1 application. topically 2 (two) times daily., Disp: 45 g, Rfl: 5   Turmeric 1053 MG TABS, Take by mouth., Disp: , Rfl:    Medications ordered in this encounter:  Meds ordered this encounter  Medications   clindamycin  (CLEOCIN ) 300 MG capsule    Sig: Take 1 capsule (300 mg total) by mouth 2 (two) times daily for 5 days.    Dispense:  10 capsule    Refill:  0    Supervising  Provider:   BLAISE ALEENE KIDD [[id number]]     *If you need refills on other medications prior to your next appointment, please contact your pharmacy*  Follow-Up: Call back or seek an in-person evaluation if the symptoms worsen or if the condition fails to improve as anticipated.  [facility] [phone]  Other Instructions Stay well hydrated with clear liquids. Re-start oral anti-histamine. Take otc Tylenol Multi-Symptom medicine for symptom improvement. Start oral antibiotic only if symptoms don't improve. Schedule a virtual appointment or follow up at an urgent care clinic if symptoms don't improve.    If you have been instructed to have an in-person evaluation today at a local Urgent Care facility, please use the link below. It will take you to a list of all of our available [facility], including address, phone number and hours of operation. Please do not delay care.  [facility]  If you or a family member do not have a primary care provider, use the link below to schedule a visit and establish care. When you choose a [facility] primary care physician or advanced practice provider, you gain a long-term partner in health. Find a Primary Care Provider  Learn more about [facility] in-office and virtual care options: [facility] - Get Care Now

## 2023-11-04 ENCOUNTER — Ambulatory Visit: Payer: Self-pay | Admitting: Family Medicine

## 2023-11-04 ENCOUNTER — Telehealth: Payer: Self-pay | Admitting: *Deleted

## 2023-11-04 DIAGNOSIS — R0981 Nasal congestion: Secondary | ICD-10-CM | POA: Diagnosis not present

## 2023-11-04 DIAGNOSIS — J029 Acute pharyngitis, unspecified: Secondary | ICD-10-CM | POA: Diagnosis not present

## 2023-11-04 NOTE — Telephone Encounter (Signed)
 Patient called to follow up on PAXLOVID  medication. Asked front desk; rep said the PCP is still in the office today until 5pm, and will look at the request as soon as possible. Informed the patient.

## 2023-11-04 NOTE — Telephone Encounter (Signed)
Pt message routed to PCP for advise.

## 2023-11-04 NOTE — Telephone Encounter (Signed)
 FYI Spoke with pt stated that she did not test herself for COVID, Pt stated that she is 100% sure that she has COVID. Pt had a virtual visit on 11/03/23 and was treated with Cyclamycin for 10 days. Pt stated that she was hoping to get Paxslovid to help with her symptoms. Advised pt to go to UC if symptoms get worse before her appointment on Monday.

## 2023-11-04 NOTE — Telephone Encounter (Signed)
 Did she test positive for Covid? If so, when?

## 2023-11-04 NOTE — Telephone Encounter (Signed)
 FYI Only or Action Required?: Action required by provider: medication request.  Patient was last seen in primary care on 11/03/2023 by Gandhi, Safal, PA-C.  Called Nurse Triage reporting Medication Refill.  Triage Disposition: Call PCP When Office is Open  Patient/caregiver understands and will follow disposition?: Yes     Copied from CRM [id number]. Topic: Clinical - Medication Question >> Nov 04, 2023  8:58 AM Mia F wrote: Reason for CRM: Pt is calling to see if she can get a rx for PAXLOVID . Please call pt whether or not rx will be sent. Please send to    [id number] [place], [place] - [address] AT CORNER OF [facility] [address] [place] [address] Phone: [phone] Fax: [phone]   After 7pm   [id number] [place], [place] - [address] AT [facility] [address] [place] [address] Phone: [phone] Fax: [phone] Reason for Disposition  [1] Prescription refill request for NON-ESSENTIAL medicine (i.e., no harm to patient if med not taken) AND [2] triager unable to refill per department policy  Answer Assessment - Initial Assessment Questions This RN spoke with pt who was triaged yesterday and had a virtual visit last night. Pt is requesting Paxlovid  sent to the below pharmacy due to symptoms:    [id number] [place], [place] - [address] AT CORNER OF [facility] [address]  [place] [address] Phone: [phone] Fax: [phone]  Protocols used: Medication Refill and Renewal Call-A-AH

## 2023-11-04 NOTE — Telephone Encounter (Signed)
 Copied from CRM [id number]. Topic: Clinical - Medication Question >> Nov 04, 2023 11:19 AM Mia F wrote: Pt is calling back to see if the dr she saw yesterday or Dr Johnny can send her paxlovid . Pt is asking for a call from Dr Johnny nurse or the nurse of the dr she saw yesterday. Please advise

## 2023-11-04 NOTE — Telephone Encounter (Signed)
Pt request sent to PCP for advise 

## 2023-11-04 NOTE — Telephone Encounter (Unsigned)
 Copied from CRM #[id number]. Topic: Clinical - Medication Question >> Nov 04, 2023 12:46 PM Joyce Cortez wrote: Reason for CRM: Pt is calling to see if she can get a rx for PAXLOVID . Please call pt whether or not rx will be sent. Please send to    [id number] [place], [place] - [address] AT [address]NER OF [facility] [address] [place] [address] Phone: [phone] Fax: [phone]  UPDATE: Pt is calling back to see if this medication can be sent to the pharmacy today before Dr.Fry leaves the office. Pt would like a callback with an update.

## 2023-11-04 NOTE — Telephone Encounter (Unsigned)
 Copied from CRM [id number]. Topic: Clinical - Medication Question >> Nov 04, 2023  8:58 AM Mia F wrote: Reason for CRM: Pt is calling to see if she can get a rx for PAXLOVID . Please call pt whether or not rx will be sent. Please send to    [id number] [place], [place] - [address] AT CORNER OF [facility] [address] [place] [address] Phone: [phone] Fax: [phone]   After 7pm   [id number] [place], [place] - [address] AT [facility] [address]  [place] [address] Phone: [phone] Fax: [phone] >> Nov 04, 2023 11:19 AM Logan F wrote: Pt is calling back to see if the dr she saw yesterday or Dr Johnny can send her paxlovid . Pt is asking for a call from Dr Johnny nurse or the nurse of the dr she saw yesterday. Please advise

## 2023-11-07 ENCOUNTER — Telehealth: Admitting: Family Medicine

## 2023-11-07 ENCOUNTER — Encounter: Payer: Self-pay | Admitting: Family Medicine

## 2023-11-07 DIAGNOSIS — B354 Tinea corporis: Secondary | ICD-10-CM | POA: Diagnosis not present

## 2023-11-07 DIAGNOSIS — B349 Viral infection, unspecified: Secondary | ICD-10-CM

## 2023-11-07 MED ORDER — NIRMATRELVIR/RITONAVIR (PAXLOVID)TABLET
3.0000 | ORAL_TABLET | Freq: Two times a day (BID) | ORAL | 0 refills | Status: AC
Start: 2023-11-07 — End: 2023-11-12

## 2023-11-07 MED ORDER — KETOCONAZOLE 2 % EX CREA: 1.0000 | TOPICAL_CREAM | Freq: Two times a day (BID) | CUTANEOUS | 0 refills | Status: AC

## 2023-11-07 NOTE — Telephone Encounter (Signed)
She is scheduled to see [place] today

## 2023-11-07 NOTE — Progress Notes (Signed)
 Subjective:    Patient ID: Joyce Cortez, female    DOB: 1980-08-18, 43 y.o.   MRN: [id number]  HPI Virtual Visit via Video Note  I connected with the patient on 11/07/23 at  2:15 PM EDT by a video enabled telemedicine application and verified that I am speaking with the correct person using two identifiers.  Location patient: home Location provider:work or home office Persons participating in the virtual visit: patient, provider  I discussed the limitations of evaluation and management by telemedicine and the availability of in person appointments. The patient expressed understanding and agreed to proceed.   HPI: Here for 2 issues. First she has had viral symptoms for about 3 weeks including body aches, fevers, and ST. No coughing or SOB. She went to an urgent care on 11-04-23 and they treated her with a week of Clindamycin . I am not sure what their diagnosis was, but she tested negative for Covid that day. At any rate she does not feel any better. She is convinced this is a Covid infection because it makes her feel the same as she did with a Covid infection she had several years ago. She insists on trying Paxlovid  for this. The other issue is a red spot on her right anterior thigh that appeared about 4 weeks ago. Sometimes it itches but it is not painful.    ROS: See pertinent positives and negatives per HPI.  Past Medical History:  Diagnosis Date   ADHD (attention deficit hyperactivity disorder)    Allergy    Asthma    Fibromyalgia     Past Surgical History:  Procedure Laterality Date   WISDOM TOOTH EXTRACTION      Family History  Adopted: Yes     Current Outpatient Medications:    clindamycin  (CLEOCIN ) 300 MG capsule, Take 1 capsule (300 mg total) by mouth 2 (two) times daily for 5 days., Disp: 10 capsule, Rfl: 0   Prenatal Multivit-Min-Fe-FA (PRENATAL 1 + IRON PO), Prenatal, Disp: , Rfl:    tretinoin (RETIN-A) 0.025 % cream, as directed Externally Pea size amount to  whole face at night for 30 days, Disp: , Rfl:    Turmeric 1053 MG TABS, Take by mouth., Disp: , Rfl:   EXAM:  VITALS per patient if applicable:  GENERAL: alert, oriented, appears well and in no acute distress  HEENT: atraumatic, conjunttiva clear, no obvious abnormalities on inspection of external nose and ears  NECK: normal movements of the head and neck  LUNGS: on inspection no signs of respiratory distress, breathing rate appears normal, no obvious gross SOB, gasping or wheezing  CV: no obvious cyanosis  MS: moves all visible extremities without noticeable abnormality  PSYCH/NEURO: pleasant and cooperative, no obvious depression or anxiety, speech and thought processing grossly intact  SKIN: she  shows me a red macular round patch of skin on the right anterior thigh.  ASSESSMENT AND PLAN: She seems to have a viral infection of some sort, and we agreed that she will try 5 days of Paxlovid . The spot on her leg appears to be a ringworm, so we will treat by applying ketoconazole  cream BID unitil clear.  Garnette Olmsted, MD  Discussed the following assessment and plan:  No diagnosis found.     I discussed the assessment and treatment plan with the patient. The patient was provided an opportunity to ask questions and all were answered. The patient agreed with the plan and demonstrated an understanding of the instructions.   The patient was  advised to call back or seek an in-person evaluation if the symptoms worsen or if the condition fails to improve as anticipated.      Review of Systems     Objective:   Physical Exam        Assessment & Plan:

## 2023-11-09 ENCOUNTER — Telehealth: Payer: Self-pay | Admitting: Family Medicine

## 2023-11-09 ENCOUNTER — Telehealth (INDEPENDENT_AMBULATORY_CARE_PROVIDER_SITE_OTHER): Payer: Self-pay | Admitting: Family Medicine

## 2023-11-09 ENCOUNTER — Encounter: Payer: Self-pay | Admitting: Family Medicine

## 2023-11-09 DIAGNOSIS — F4311 Post-traumatic stress disorder, acute: Secondary | ICD-10-CM | POA: Diagnosis not present

## 2023-11-09 DIAGNOSIS — B349 Viral infection, unspecified: Secondary | ICD-10-CM

## 2023-11-09 NOTE — Progress Notes (Signed)
   Subjective:    Patient ID: Joyce Cortez, female    DOB: [birth year], 43 y.o.   MRN: [id number]  HPI Virtual Visit via Video Note  I connected with the patient on 11/09/23 at  2:00 PM EDT by a video enabled telemedicine application and verified that I am speaking with the correct person using two identifiers.  Location patient: home Location provider:work or home office Persons participating in the virtual visit: patient, provider  I discussed the limitations of evaluation and management by telemedicine and the availability of in person appointments. The patient expressed understanding and agreed to proceed.   HPI: We spoke to her about her recent viral illness, and she had questions about her insurance (which changed recently). This ended up NOT being an actual encounter.    ROS: See pertinent positives and negatives per HPI.  Past Medical History:  Diagnosis Date   ADHD (attention deficit hyperactivity disorder)    Allergy    Asthma    Fibromyalgia     Past Surgical History:  Procedure Laterality Date   WISDOM TOOTH EXTRACTION      Family History  Adopted: Yes     Current Outpatient Medications:    ketoconazole  (NIZORAL ) 2 % cream, Apply 1 Application topically 2 (two) times daily., Disp: 30 g, Rfl: 0   nirmatrelvir /ritonavir  (PAXLOVID ) 20 x 150 MG & 10 x 100MG  TABS, Take 3 tablets by mouth 2 (two) times daily for 5 days. (Take nirmatrelvir  150 mg two tablets twice daily for 5 days and ritonavir  100 mg one tablet twice daily for 5 days) Patient GFR is 86, Disp: 30 tablet, Rfl: 0   Prenatal Multivit-Min-Fe-FA (PRENATAL 1 + IRON PO), Prenatal, Disp: , Rfl:    tretinoin (RETIN-A) 0.025 % cream, as directed Externally Pea size amount to whole face at night for 30 days, Disp: , Rfl:    Turmeric 1053 MG TABS, Take by mouth., Disp: , Rfl:   EXAM:  VITALS per patient if applicable:  GENERAL: alert, oriented, appears well and in no acute distress  HEENT: atraumatic,  conjunttiva clear, no obvious abnormalities on inspection of external nose and ears  NECK: normal movements of the head and neck  LUNGS: on inspection no signs of respiratory distress, breathing rate appears normal, no obvious gross SOB, gasping or wheezing  CV: no obvious cyanosis  MS: moves all visible extremities without noticeable abnormality  PSYCH/NEURO: pleasant and cooperative, no obvious depression or anxiety, speech and thought processing grossly intact  ASSESSMENT AND PLAN: We found the insurance information she needed and we relayed this up her. She will follow up ans needed.  Garnette Olmsted, MD  Discussed the following assessment and plan:  No diagnosis found.     I discussed the assessment and treatment plan with the patient. The patient was provided an opportunity to ask questions and all were answered. The patient agreed with the plan and demonstrated an understanding of the instructions.   The patient was advised to call back or seek an in-person evaluation if the symptoms worsen or if the condition fails to improve as anticipated.      Review of Systems     Objective:   Physical Exam        Assessment & Plan:

## 2023-11-09 NOTE — Telephone Encounter (Signed)
 Copied from CRM #[id number]. Topic: Clinical - Medical Advice >> Nov 09, 2023 11:37 AM Vena HERO wrote: Reason for CRM: pt scheduled video visit for today at 2 and has another appt at 230 that is mandatory and wants to make sure she is seen in a timely manner. She also wanted to be clear because of miscommunication in the past that she would like to request a discount card for paxlovid , she has been trying to get one from the website and having issues with that. She has been sick since last Wednesday and is very frustrated that no one has received her messages on time whenever she calls in.

## 2023-11-09 NOTE — Telephone Encounter (Signed)
 Spoke with pt advised on how to activate her co-pay savings card for Pfizer, Pt did not have a VV with Dr Johnny this afternoon as planned since I was able to contact Pfizer on her behalf.

## 2023-11-10 ENCOUNTER — Ambulatory Visit: Payer: Self-pay

## 2023-11-10 ENCOUNTER — Encounter: Payer: Self-pay | Admitting: Family Medicine

## 2023-11-10 NOTE — Telephone Encounter (Signed)
 FYI Only or Action Required?: Action required by provider: clinical question for provider.  Patient was last seen in primary care on 11/09/2023 by Johnny Garnette LABOR, MD.  Called Nurse Triage reporting New Med Request.  Symptoms began several days ago.  Interventions attempted: Prescription medications: ketoconazole .  Symptoms are: gradually improving.  Triage Disposition: Call PCP Within 24 Hours  Patient/caregiver understands and will follow disposition?: Yes  **See note below**     Copied from CRM #[id number]. Topic: Clinical - Medication Question >> Nov 10, 2023  2:07 PM Alfonso HERO wrote: Reason for CRM: ketoconazole  (NIZORAL ) 2 % cream needs is working everywhere except on her hip and thinking she needs a stronger cream. She states that is still red in the effected area. [id number] [place], [place] - [address] AT [facility] [address] [place] [address] Phone: [phone] Fax: [phone] Hours: Open 24 hours Reason for Disposition  [1] Caller requests to speak ONLY to PCP AND [2] NON-URGENT question  Answer Assessment - Initial Assessment Questions 1. REASON FOR CALL or QUESTION: What is your reason for calling today? or How can I best  Patient is requesting a stronger cream than the ketoconazole  (NIZORAL ) 2 % cream, she reports right thigh is still red and does not seem to be working on that particular area as well as the others, she is applying as directed but would like a different cream/stronger dosage. Pt. Has telemedicine visit on 10/20 for ringworm.     [id number] [place] - [address] AT [facility] [address] [place] [address] Phone: [phone] Fax: [phone]  Protocols used: PCP Call - No Triage-A-AH

## 2023-11-11 MED ORDER — CLOTRIMAZOLE-BETAMETHASONE 1-0.05 % EX CREA: 1.0000 | TOPICAL_CREAM | Freq: Two times a day (BID) | CUTANEOUS | 1 refills | Status: AC

## 2023-11-11 NOTE — Telephone Encounter (Signed)
 New stronger Rx sent to pt pharmacy today

## 2023-11-11 NOTE — Telephone Encounter (Signed)
 I sent in a stronger ointment

## 2023-11-15 ENCOUNTER — Ambulatory Visit (INDEPENDENT_AMBULATORY_CARE_PROVIDER_SITE_OTHER): Admitting: Family Medicine

## 2023-11-15 ENCOUNTER — Encounter: Payer: Self-pay | Admitting: Family Medicine

## 2023-11-15 VITALS — BP 118/76 | HR 82 | Temp 98.2°F | Wt 165.0 lb

## 2023-11-15 DIAGNOSIS — B359 Dermatophytosis, unspecified: Secondary | ICD-10-CM

## 2023-11-15 DIAGNOSIS — J069 Acute upper respiratory infection, unspecified: Secondary | ICD-10-CM | POA: Diagnosis not present

## 2023-11-15 DIAGNOSIS — R0602 Shortness of breath: Secondary | ICD-10-CM | POA: Diagnosis not present

## 2023-11-15 MED ORDER — ALBUTEROL SULFATE HFA 108 (90 BASE) MCG/ACT IN AERS: 2.0000 | INHALATION_SPRAY | RESPIRATORY_TRACT | 2 refills | Status: AC | PRN

## 2023-11-15 NOTE — Progress Notes (Signed)
   Subjective:    Patient ID: Joyce Cortez, female    DOB: 31-Aug-1980, 43 y.o.   MRN: [id number]  HPI Here to follow up an upper respiratory illness that started on 10-26-23 with a dry cough, weakness, and some SOB. No fever. She had a telehealth visit on 11-03-23 and she was treated with Clindamycin . This caused significant GI side effects so she stopped it after a few days. The cough and SOB persisted and she had a virtual visit with us  on 11-07-23 and we determined she likely had a Covid infection, so we treated her with 5 days of Paxlovid . Since then she feels better with less weakness and less coughing, but she still has some SOB at times. Still no fever. Of note she was treated for childhood asthma, which she then grew out of. During our virtual visit she also complained of a red spot on her right buttock that we determined was a ringworm. We gave her Lotrisone cream to apply, and this has been slowly getting better. At first it had a bright red color and it itched. Now it no longer itches and the color has faded a little.    Review of Systems  Constitutional:  Positive for fatigue. Negative for fever.  Respiratory:  Positive for chest tightness and shortness of breath. Negative for choking and wheezing.   Cardiovascular: Negative.   Gastrointestinal: Negative.   Skin:  Positive for rash.       Objective:   Physical Exam Constitutional:      Appearance: Normal appearance. She is not ill-appearing.  HENT:     Right Ear: Tympanic membrane, ear canal and external ear normal.     Left Ear: Tympanic membrane, ear canal and external ear normal.     Nose: Nose normal.     Mouth/Throat:     Pharynx: Oropharynx is clear.  Eyes:     Conjunctiva/sclera: Conjunctivae normal.  Cardiovascular:     Rate and Rhythm: Normal rate and regular rhythm.     Pulses: Normal pulses.     Heart sounds: Normal heart sounds.  Pulmonary:     Effort: Pulmonary effort is normal.     Breath sounds: Normal  breath sounds.  Lymphadenopathy:     Cervical: No cervical adenopathy.  Skin:    Comments: There is a 2 cm round macular pale pink spot of skin on the right buttock   Neurological:     Mental Status: She is alert.           Assessment & Plan:  She is recovering from a recent viral URI, likely Covid, and this has triggered some mild asthma symptoms. We will provide her with an albuterol  inhaler to use as needed for SOB. She is also treating a ringworm with Lotrisone cream, and she will continue this until the spot is clear.  Garnette Olmsted, MD

## 2023-11-16 ENCOUNTER — Ambulatory Visit: Payer: Self-pay

## 2023-11-16 DIAGNOSIS — F4311 Post-traumatic stress disorder, acute: Secondary | ICD-10-CM | POA: Diagnosis not present

## 2023-11-16 NOTE — Telephone Encounter (Signed)
 Reason for Disposition . [1] MODERATE weakness (e.g., interferes with work, school, normal activities) AND [2] persists > 3 days  Answer Assessment - Initial Assessment Questions No available appts today. Advised UC today and ED if symptoms worsen. Patient requests call back, LOV 11/15/23.  1. DESCRIPTION: Describe how you are feeling.     Feeling fatigue, sob due to feeling fatigue and when exert myself, inhaler 2. SEVERITY: How bad is it?  Can you stand and walk?     Able walk and stand, using walker due to fibromyalgia 3. ONSET: When did these symptoms begin? (e.g., hours, days, weeks, months) 10/26/23      4. CAUSE: What do you think is causing the weakness or fatigue? (e.g., not drinking enough fluids, medical problem, trouble sleeping)     Able to eat and drink, urinating fine,  5. NEW MEDICINES:  Have you started on any new medicines recently? (e.g., opioid pain medicines, benzodiazepines, muscle relaxants, antidepressants, antihistamines, neuroleptics, beta blockers)     No 6. OTHER SYMPTOMS: Do you have any other symptoms? (e.g., chest pain, fever, cough, SOB, vomiting, diarrhea, bleeding, other areas of pain) Denies dizziness, faint, chest pain, fever chills, n/v/, pain Last bm today normal  Protocols used: Weakness (Generalized) and Fatigue-A-AH

## 2023-11-16 NOTE — Telephone Encounter (Signed)
 2nd attempt to reach patient to triage symptoms. Left VM to call back.

## 2023-11-16 NOTE — Telephone Encounter (Signed)
 3rd attempt. Left VM to call back and discuss symptoms further.   Please provide recommendations for patient as inhaler has not helped with her fatigue since OV 10/28

## 2023-11-16 NOTE — Telephone Encounter (Signed)
 This RN left VM to call back to discuss symptoms further. Attempt #1  Message from Avram MATSU sent at 11/16/2023  2:02 PM EDT  Reason for Triage: was seen for low energy on 10/28, was given an inhaler but still experiencing low energy. Feels like her heart is working harder, could not wait for NT Please advise [phone] okay to leave vm

## 2023-11-17 ENCOUNTER — Telehealth: Payer: Self-pay | Admitting: *Deleted

## 2023-11-17 NOTE — Telephone Encounter (Signed)
 Noted

## 2023-11-17 NOTE — Telephone Encounter (Signed)
 Copied from CRM #[id number]. Topic: General - Call Back - No Documentation >> Nov 17, 2023 10:10 AM Macario HERO wrote: Reason for CRM: Patient returning call from [facility]. Called CAL and advised she will return call. Patient stated she is going to rest and would rather a MyChart message.

## 2023-11-17 NOTE — Telephone Encounter (Signed)
 Left message to return phone call.

## 2023-11-21 NOTE — Telephone Encounter (Signed)
 Called pt left a detailed message advised to call the office back regarding this encounter

## 2023-11-23 MED ORDER — TRAZODONE HCL 50 MG PO TABS: 50.0000 mg | ORAL_TABLET | Freq: Every day | ORAL | 0 refills | Status: AC

## 2023-11-23 NOTE — Addendum Note (Signed)
 Addended by: JOHNNY SENIOR A on: 11/23/2023 01:03 PM   Modules accepted: Orders

## 2023-11-23 NOTE — Telephone Encounter (Signed)
 Pt called the office this morning states that she has not been sleeping and needs some sleep medication prescribed. Please advise

## 2023-11-23 NOTE — Telephone Encounter (Signed)
 I sent in some Trazodone she can try at bedtime

## 2023-11-25 DIAGNOSIS — F4311 Post-traumatic stress disorder, acute: Secondary | ICD-10-CM | POA: Diagnosis not present

## 2023-11-30 DIAGNOSIS — F4311 Post-traumatic stress disorder, acute: Secondary | ICD-10-CM | POA: Diagnosis not present

## 2023-12-07 DIAGNOSIS — F4311 Post-traumatic stress disorder, acute: Secondary | ICD-10-CM | POA: Diagnosis not present

## 2023-12-14 DIAGNOSIS — F4311 Post-traumatic stress disorder, acute: Secondary | ICD-10-CM | POA: Diagnosis not present

## 2023-12-21 DIAGNOSIS — F4311 Post-traumatic stress disorder, acute: Secondary | ICD-10-CM | POA: Diagnosis not present

## 2023-12-22 ENCOUNTER — Telehealth: Payer: Self-pay | Admitting: *Deleted

## 2023-12-22 NOTE — Telephone Encounter (Unsigned)
 Copied from CRM #[id number]. Topic: General - Other >> Dec 22, 2023  3:31 PM Joyce Cortez wrote: Reason for CRM: Patient called in requesting all of her diagnosis sent to her via mychart. She said If the diagnosis are not on file, that Dr. Johnny can just state that she was prescribed medication for adhd and fibromyalgia. Patient can be reached at [phone]. Patient states she just wants evidence of this because she was pulled over recently and was very nervous.

## 2023-12-26 DIAGNOSIS — F4311 Post-traumatic stress disorder, acute: Secondary | ICD-10-CM | POA: Diagnosis not present

## 2023-12-29 NOTE — Telephone Encounter (Signed)
 Spoke to the patient an appointment was scheduled.

## 2023-12-29 NOTE — Telephone Encounter (Signed)
Make an OV to discuss this  

## 2024-01-02 ENCOUNTER — Ambulatory Visit: Admitting: Family Medicine

## 2024-01-02 ENCOUNTER — Encounter: Payer: Self-pay | Admitting: Family Medicine

## 2024-01-02 VITALS — BP 118/76 | HR 78 | Temp 98.6°F | Ht 64.0 in | Wt 157.0 lb

## 2024-01-02 DIAGNOSIS — Z3689 Encounter for other specified antenatal screening: Secondary | ICD-10-CM | POA: Diagnosis not present

## 2024-01-02 DIAGNOSIS — N926 Irregular menstruation, unspecified: Secondary | ICD-10-CM

## 2024-01-02 DIAGNOSIS — F419 Anxiety disorder, unspecified: Secondary | ICD-10-CM

## 2024-01-02 DIAGNOSIS — F900 Attention-deficit hyperactivity disorder, predominantly inattentive type: Secondary | ICD-10-CM

## 2024-01-02 DIAGNOSIS — M797 Fibromyalgia: Secondary | ICD-10-CM | POA: Diagnosis not present

## 2024-01-02 LAB — POCT URINE PREGNANCY: Preg Test, Ur: NEGATIVE
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
## 2024-01-03 ENCOUNTER — Telehealth: Payer: Self-pay | Admitting: *Deleted

## 2024-01-03 NOTE — Telephone Encounter (Signed)
 Copied from CRM #[id number]. Topic: General - Other >> Dec 22, 2023  3:31 PM Joyce Cortez wrote: Reason for CRM: Patient called in requesting all of her diagnosis sent to her via mychart. She said If the diagnosis are not on file, that Dr. Johnny can just state that she was prescribed medication for adhd and fibromyalgia. Patient can be reached at [phone]. Patient states she just wants evidence of this because she was pulled over recently and was very nervous. >> Dec 27, 2023 12:59 PM Joyce Cortez wrote: Patient is calling to get an update on her request from [facility]. She stated that her lawyer feels that this information would be helpful for her. Please call patient.

## 2024-01-04 ENCOUNTER — Telehealth: Payer: Self-pay | Admitting: *Deleted

## 2024-01-04 NOTE — Telephone Encounter (Signed)
 Copied from CRM [id number]. Topic: Clinical - Medication Question >> Jan 04, 2024 12:32 PM Aleatha C wrote: Reason for CRM: Patient was in with Dr Johnny and she spoke about getting Adrell and she wants to know if he can have it prescribe as soon as possible because her insurance will be changing in January

## 2024-01-05 NOTE — Telephone Encounter (Signed)
 2nd attempt to reach pt with no success, left pt detailed message to call the office back regarding this message

## 2024-01-05 NOTE — Telephone Encounter (Signed)
 Pt returned call to Nancy and stated that she can send the script to [facility] [address] [place] [address] [phone]. Please call pt back if needed

## 2024-01-05 NOTE — Telephone Encounter (Signed)
 Duplicate encounter.

## 2024-01-06 ENCOUNTER — Other Ambulatory Visit: Payer: Self-pay | Admitting: Family Medicine

## 2024-01-06 MED ORDER — AMPHETAMINE-DEXTROAMPHET ER 20 MG PO CP24: 20.0000 mg | ORAL_CAPSULE | Freq: Two times a day (BID) | ORAL | 0 refills | Status: AC

## 2024-01-06 NOTE — Telephone Encounter (Signed)
 Spoke with pt states that she previously took Brand Adderall 20 mg but stopped, pt states that she wants to get back on this Rx, requests to send it to [facility] [address] [place] [address]

## 2024-01-06 NOTE — Progress Notes (Signed)
I sent in a one month supply  

## 2024-01-06 NOTE — Telephone Encounter (Signed)
 Done
